# Patient Record
Sex: Male | Born: 1970 | Race: White | Hispanic: No | Marital: Single | Smoking: Never smoker
Health system: Southern US, Community
[De-identification: ages and names within clinical notes are randomized; demographics above are authoritative.]

## PROBLEM LIST (undated history)

## (undated) DIAGNOSIS — K5730 Diverticulosis of large intestine without perforation or abscess without bleeding: Secondary | ICD-10-CM

## (undated) DIAGNOSIS — F1910 Other psychoactive substance abuse, uncomplicated: Secondary | ICD-10-CM

## (undated) DIAGNOSIS — M109 Gout, unspecified: Secondary | ICD-10-CM

## (undated) DIAGNOSIS — E119 Type 2 diabetes mellitus without complications: Secondary | ICD-10-CM

## (undated) HISTORY — DX: Other psychoactive substance abuse, uncomplicated: F19.10

## (undated) HISTORY — PX: OTHER SURGICAL HISTORY: SHX169

---

## 2011-07-28 ENCOUNTER — Encounter (HOSPITAL_COMMUNITY): Payer: Self-pay | Admitting: Emergency Medicine

## 2011-07-28 ENCOUNTER — Emergency Department (HOSPITAL_COMMUNITY): Payer: Self-pay

## 2011-07-28 ENCOUNTER — Emergency Department (HOSPITAL_COMMUNITY)
Admission: EM | Admit: 2011-07-28 | Discharge: 2011-07-28 | Disposition: A | Discharge status: Home / Self Care | Payer: Self-pay | Attending: Emergency Medicine | Admitting: Emergency Medicine

## 2011-07-28 DIAGNOSIS — R209 Unspecified disturbances of skin sensation: Secondary | ICD-10-CM | POA: Insufficient documentation

## 2011-07-28 DIAGNOSIS — M79672 Pain in left foot: Secondary | ICD-10-CM

## 2011-07-28 DIAGNOSIS — M7989 Other specified soft tissue disorders: Secondary | ICD-10-CM | POA: Insufficient documentation

## 2011-07-28 DIAGNOSIS — Y990 Civilian activity done for income or pay: Secondary | ICD-10-CM | POA: Insufficient documentation

## 2011-07-28 DIAGNOSIS — S90129A Contusion of unspecified lesser toe(s) without damage to nail, initial encounter: Secondary | ICD-10-CM | POA: Insufficient documentation

## 2011-07-28 DIAGNOSIS — W208XXA Other cause of strike by thrown, projected or falling object, initial encounter: Secondary | ICD-10-CM | POA: Insufficient documentation

## 2011-07-28 DIAGNOSIS — M79609 Pain in unspecified limb: Secondary | ICD-10-CM | POA: Insufficient documentation

## 2011-07-28 MED ORDER — KETOROLAC TROMETHAMINE 10 MG PO TABS
10.0000 mg | ORAL_TABLET | Freq: Once | ORAL | Status: AC
  Administered 2011-07-28: 10 mg via ORAL
  Filled 2011-07-28: qty 1

## 2011-07-28 MED ORDER — OXYCODONE-ACETAMINOPHEN 5-325 MG PO TABS
1.0000 | ORAL_TABLET | Freq: Once | ORAL | Status: AC
  Administered 2011-07-28: 1 via ORAL
  Filled 2011-07-28: qty 1

## 2011-07-28 MED ORDER — COLCHICINE 0.6 MG PO TABS
1.2000 mg | ORAL_TABLET | Freq: Once | ORAL | Status: AC
  Administered 2011-07-28: 1.2 mg via ORAL
  Filled 2011-07-28: qty 2

## 2011-07-28 MED ORDER — OXYCODONE-ACETAMINOPHEN 5-325 MG PO TABS: 1.0000 | ORAL_TABLET | ORAL | Status: DC | PRN

## 2011-07-28 MED ORDER — COLCHICINE 0.6 MG PO TABS: 0.6000 mg | ORAL_TABLET | Freq: Every day | ORAL | Status: DC

## 2011-07-28 MED ORDER — ONDANSETRON HCL 4 MG PO TABS
4.0000 mg | ORAL_TABLET | Freq: Once | ORAL | Status: AC
  Administered 2011-07-28: 4 mg via ORAL
  Filled 2011-07-28: qty 1

## 2011-07-28 MED ORDER — KETOROLAC TROMETHAMINE 10 MG PO TABS: ORAL_TABLET | ORAL | Status: DC

## 2011-07-28 NOTE — ED Notes (Signed)
Pt DC to home with steady gait 

## 2011-07-28 NOTE — ED Notes (Signed)
Pt was at work and had a tool box fall out of van and land on foot.  Pt having pain in left big toe.

## 2011-07-28 NOTE — Discharge Instructions (Signed)
Your xray is negative for acute problem. This is probably gout arthritis. Please use medications as suggested. Percocet may cause drowsiness, use with caution.

## 2011-07-28 NOTE — ED Provider Notes (Signed)
History     CSN: [id number]  Arrival date & time 07/28/11  1229   First MD Initiated Contact with Patient 07/28/11 1338      Chief Complaint  Patient presents with  . Foot Pain    (Consider location/radiation/quality/duration/timing/severity/associated sxs/prior treatment) Patient is a 41 y.o. male presenting with foot injury. The history is provided by the patient.  Foot Injury  The incident occurred less than 1 hour ago. The incident occurred at work. The injury mechanism was a direct blow (Tool box fell out of van on left foot). The pain is present in the left foot. The quality of the pain is described as throbbing. The pain is severe. The pain has been constant since onset. Associated symptoms include inability to bear weight and tingling. He reports no foreign bodies present. The symptoms are aggravated by bearing weight. He has tried nothing for the symptoms.    History reviewed. No pertinent past medical history.  History reviewed. No pertinent past surgical history.  No family history on file.  History  Substance Use Topics  . Smoking status: Never Smoker   . Smokeless tobacco: Not on file  . Alcohol Use: Yes      Review of Systems  Constitutional: Negative for activity change.       All ROS Neg except as noted in HPI  HENT: Negative for nosebleeds and neck pain.   Eyes: Negative for photophobia and discharge.  Respiratory: Negative for cough, shortness of breath and wheezing.   Cardiovascular: Negative for chest pain and palpitations.  Gastrointestinal: Negative for abdominal pain and blood in stool.  Genitourinary: Negative for dysuria, frequency and hematuria.  Musculoskeletal: Negative for back pain and arthralgias.  Skin: Negative.   Neurological: Positive for tingling. Negative for dizziness, seizures and speech difficulty.  Psychiatric/Behavioral: Negative for hallucinations and confusion.    Allergies  Review of patient's allergies indicates no known  allergies.  Home Medications  No current outpatient prescriptions on file.  BP 143/72  Pulse 85  Temp(Src) 97.8 F (36.6 C) (Oral)  Resp 20  Ht 5\' 6"  (1.676 m)  Wt 235 lb (106.595 kg)  BMI 37.93 kg/m2  SpO2 98%  Physical Exam  Nursing note and vitals reviewed. Constitutional: He is oriented to person, place, and time. He appears well-developed and well-nourished.  Non-toxic appearance.  HENT:  Head: Normocephalic.  Right Ear: Tympanic membrane and external ear normal.  Left Ear: Tympanic membrane and external ear normal.  Eyes: EOM and lids are normal. Pupils are equal, round, and reactive to light.  Neck: Normal range of motion. Neck supple. Carotid bruit is not present.  Cardiovascular: Normal rate, regular rhythm, normal heart sounds, intact distal pulses and normal pulses.   Pulmonary/Chest: Breath sounds normal. No respiratory distress.  Abdominal: Soft. Bowel sounds are normal. There is no tenderness. There is no guarding.  Musculoskeletal: Normal range of motion.       Bruise and mils swelling of the left 1st toe and dorsum of the left foot. Pulses symetrical  Lymphadenopathy:       Head (right side): No submandibular adenopathy present.       Head (left side): No submandibular adenopathy present.    He has no cervical adenopathy.  Neurological: He is alert and oriented to person, place, and time. He has normal strength. No cranial nerve deficit or sensory deficit.  Skin: Skin is warm and dry.  Psychiatric: He has a normal mood and affect. His speech is normal.  ED Course  Procedures (including critical care time)  Labs Reviewed - No data to display Dg Foot Complete Left  07/28/2011  *RADIOLOGY REPORT*  Clinical Data: 41 year old male with fall.  Pain.  LEFT FOOT - COMPLETE 3+ VIEW  Comparison: [facility] 07/24/2011.  Findings: Bone mineralization is within normal limits.  Calcaneus intact.  Normal joint spaces.  Osseous structures of the first ray  are intact.  No acute fracture identified.  IMPRESSION: No acute fracture or dislocation identified about the left foot.  Original Report Authenticated By: Harley Hallmark, M.D.     1. Foot pain, left       MDM  I have reviewed nursing notes, vital signs, and all appropriate lab and imaging results for this patient. Left foot xray negative for fracture. Ice and elevation suggested. Rx for pain meds given. Pt to see orthopedic MD if not improving.       Kathie Dike, [place] 08/02/11 1045

## 2011-08-03 NOTE — ED Provider Notes (Signed)
Medical screening examination/treatment/procedure(s) were performed by non-physician practitioner and as supervising physician I was immediately available for consultation/collaboration.  Donnetta Hutching, MD 08/03/11 1815

## 2011-09-20 ENCOUNTER — Emergency Department (HOSPITAL_COMMUNITY): Payer: Self-pay

## 2011-09-20 ENCOUNTER — Emergency Department (HOSPITAL_COMMUNITY)
Admission: EM | Admit: 2011-09-20 | Discharge: 2011-09-20 | Disposition: A | Discharge status: Home / Self Care | Payer: Self-pay | Attending: Emergency Medicine | Admitting: Emergency Medicine

## 2011-09-20 ENCOUNTER — Encounter (HOSPITAL_COMMUNITY): Payer: Self-pay

## 2011-09-20 DIAGNOSIS — M6580 Other synovitis and tenosynovitis, unspecified site: Secondary | ICD-10-CM | POA: Insufficient documentation

## 2011-09-20 DIAGNOSIS — M778 Other enthesopathies, not elsewhere classified: Secondary | ICD-10-CM

## 2011-09-20 DIAGNOSIS — M25529 Pain in unspecified elbow: Secondary | ICD-10-CM | POA: Insufficient documentation

## 2011-09-20 HISTORY — DX: Gout, unspecified: M10.9

## 2011-09-20 MED ORDER — OXYCODONE-ACETAMINOPHEN 5-325 MG PO TABS: 1.0000 | ORAL_TABLET | ORAL | Status: AC | PRN

## 2011-09-20 MED ORDER — IBUPROFEN 600 MG PO TABS: 600.0000 mg | ORAL_TABLET | Freq: Four times a day (QID) | ORAL | Status: AC | PRN

## 2011-09-20 MED ORDER — HYDROCODONE-ACETAMINOPHEN 5-325 MG PO TABS
1.0000 | ORAL_TABLET | Freq: Once | ORAL | Status: AC
  Administered 2011-09-20: 1 via ORAL
  Filled 2011-09-20: qty 1

## 2011-09-20 MED ORDER — IBUPROFEN 600 MG PO TABS: 600.0000 mg | ORAL_TABLET | Freq: Four times a day (QID) | ORAL | Status: DC | PRN

## 2011-09-20 MED ORDER — OXYCODONE-ACETAMINOPHEN 5-325 MG PO TABS
1.0000 | ORAL_TABLET | Freq: Once | ORAL | Status: AC
  Administered 2011-09-20: 1 via ORAL
  Filled 2011-09-20: qty 1

## 2011-09-20 MED ORDER — HYDROCODONE-ACETAMINOPHEN 5-325 MG PO TABS: 1.0000 | ORAL_TABLET | ORAL | Status: DC | PRN

## 2011-09-20 NOTE — Discharge Instructions (Signed)
Tendinitis Tendinitis is swelling and inflammation of the tendons. Tendons are band-like tissues that connect muscle to bone. Tendinitis commonly occurs in the:   Shoulders (rotator cuff).   Heels (Achilles tendon).   Elbows (triceps tendon).  CAUSES Tendinitis is usually caused by overusing the tendon, muscles, and joints involved. When the tissue surrounding a tendon (synovium) becomes inflamed, it is called tenosynovitis. Tendinitis commonly develops in people whose jobs require repetitive motions. SYMPTOMS  Pain.   Tenderness.   Mild swelling.  DIAGNOSIS Tendinitis is usually diagnosed by physical exam. Your caregiver may also order X-rays or other imaging tests. TREATMENT Your caregiver may recommend certain medicines or exercises for your treatment. HOME CARE INSTRUCTIONS   Use a sling or splint for as long as directed by your caregiver until the pain decreases.   Avoid using the limb while the tendon is painful. Perform gentle range of motion exercises only as directed by your caregiver. Stop exercises if pain or discomfort increase, unless directed otherwise by your caregiver.   Only take over-the-counter or prescription medicines for pain, discomfort, or fever as directed by your caregiver.  SEEK MEDICAL CARE IF:   Your pain and swelling increase.   You develop new, unexplained symptoms, especially increased numbness in the hands.  MAKE SURE YOU:   Understand these instructions.   Will watch your condition.   Will get help right away if you are not doing well or get worse.  Document Released: 03/18/2000 Document Revised: 03/10/2011 Document Reviewed: 06/07/2010 [facility] Patient Information 2012 [place], [place].   Please obtain an elbow band at your local drugstore as discussed.  Use the medicines prescribed for inflammation and pain relief.  Do not drive within 4 hours of taking hydrocodone as this medication will make you drowsy.  Rest your elbow joint as much as  possible for the next several weeks and applying a heating pad 20 minutes 3-4 times daily may also help reduce inflammation in this tendon.  Please call Dr. Hilda Lias for further management if your symptoms persist or do not resolve with this treatment.

## 2011-09-20 NOTE — ED Notes (Signed)
Pt reports rt elbow pain for the past 6 weeks.  Pt denies any injury . Pt reports hx of gout

## 2011-09-20 NOTE — ED Provider Notes (Signed)
History     CSN: [id number]  Arrival date & time 09/20/11  1308   First MD Initiated Contact with Patient 09/20/11 1000      Chief Complaint  Patient presents with  . Elbow Pain    (Consider location/radiation/quality/duration/timing/severity/associated sxs/prior treatment) HPI Comments: Mark Acosta presents with a 6 week history of increasing right elbow pain despite injury.  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  He is right-handed.  He does have a history of gout which has never affected his elbow but has had in his toe in the past.  He denies redness or swelling at his elbow joint but has constant pain which is worse with palpation and with twisting his forearm.  He has taken ibuprofen with no relief.  He was given an oxycodone by his mother last night which did help relieve his pain is not so that he could sleep.  He has tried no other relievers of this pain other than trying icy hot which seemed to make the pain worse.  Pain did not radiate and he denies fevers, rash and swelling at the joint.  The history is provided by the patient.    Past Medical History  Diagnosis Date  . Gout     History reviewed. No pertinent past surgical history.  No family history on file.  History  Substance Use Topics  . Smoking status: Never Smoker   . Smokeless tobacco: Not on file  . Alcohol Use: Yes      Review of Systems  Musculoskeletal: Positive for arthralgias. Negative for joint swelling.  Skin: Negative for wound.  Neurological: Negative for weakness and numbness.    Allergies  Review of patient's allergies indicates no known allergies.  Home Medications   Current Outpatient Rx  Name Route Sig Dispense Refill  . HYDROCODONE-ACETAMINOPHEN 5-325 MG PO TABS Oral Take 1 tablet by mouth every 4 (four) hours as needed for pain. 20 tablet 0  . IBUPROFEN 600 MG PO TABS Oral Take 1 tablet (600 mg total) by mouth every 6 (six)  hours as needed for pain. 30 tablet 0    BP 151/76  Pulse 79  Temp 98.4 F (36.9 C) (Oral)  Resp 18  SpO2 96%  Physical Exam  Constitutional: He appears well-developed and well-nourished.  HENT:  Head: Atraumatic.  Neck: Normal range of motion.  Cardiovascular:       Pulses equal bilaterally  Musculoskeletal: He exhibits tenderness.       Right elbow: tenderness found. Lateral epicondyle tenderness noted.  Neurological: He is alert. He has normal strength. He displays normal reflexes. No sensory deficit.       Equal strength  Skin: Skin is warm and dry.  Psychiatric: He has a normal mood and affect.    ED Course  Procedures (including critical care time)  Labs Reviewed - No data to display Dg Elbow Complete Right  09/20/2011  *RADIOLOGY REPORT*  Clinical Data: 41 year old male with pain and decreased range of motion.  RIGHT ELBOW - COMPLETE 3+ VIEW  Comparison: None.  Findings: No joint effusion is evident. Bone mineralization is within normal limits.  Joint spaces and alignment within normal limits.  No fracture or dislocation.  IMPRESSION: No acute osseous abnormality identified about the right elbow.  Original Report Authenticated By: Ulla Potash III, M.D.     1. Tendinitis of right elbow       MDM  Oxycodone and ibuprofen  prescribed.  Also encouraged to pick up a tennis elbow compression band.  Heat therapy 20 minutes several times daily.  Referral to orthopedics if symptoms are not improving over the next week with this treatment.        Burgess Amor, [place] 09/20/11 1431

## 2011-09-20 NOTE — ED Provider Notes (Signed)
Medical screening examination/treatment/procedure(s) were performed by non-physician practitioner and as supervising physician I was immediately available for consultation/collaboration.  Geoffery Lyons, MD 09/20/11 1534

## 2011-09-20 NOTE — ED Notes (Signed)
Pt discharged with family.

## 2012-08-28 ENCOUNTER — Emergency Department (HOSPITAL_COMMUNITY)
Admission: EM | Admit: 2012-08-28 | Discharge: 2012-08-28 | Disposition: A | Discharge status: Home / Self Care | Payer: Self-pay | Attending: Emergency Medicine | Admitting: Emergency Medicine

## 2012-08-28 ENCOUNTER — Encounter (HOSPITAL_COMMUNITY): Payer: Self-pay

## 2012-08-28 DIAGNOSIS — M109 Gout, unspecified: Secondary | ICD-10-CM | POA: Insufficient documentation

## 2012-08-28 DIAGNOSIS — M79609 Pain in unspecified limb: Secondary | ICD-10-CM | POA: Insufficient documentation

## 2012-08-28 DIAGNOSIS — M79675 Pain in left toe(s): Secondary | ICD-10-CM

## 2012-08-28 MED ORDER — PREDNISONE 50 MG PO TABS
60.0000 mg | ORAL_TABLET | Freq: Once | ORAL | Status: AC
  Administered 2012-08-28: 60 mg via ORAL
  Filled 2012-08-28: qty 1

## 2012-08-28 MED ORDER — OXYCODONE-ACETAMINOPHEN 5-325 MG PO TABS: 1.0000 | ORAL_TABLET | ORAL | Status: DC | PRN

## 2012-08-28 MED ORDER — OXYCODONE-ACETAMINOPHEN 5-325 MG PO TABS
1.0000 | ORAL_TABLET | Freq: Once | ORAL | Status: AC
  Administered 2012-08-28: 1 via ORAL
  Filled 2012-08-28: qty 1

## 2012-08-28 MED ORDER — PREDNISONE 20 MG PO TABS: ORAL_TABLET | ORAL | Status: DC

## 2012-08-28 NOTE — ED Notes (Signed)
Pt reports history of gout.  C/O pain at base of left great toe x 3 days.  Denies injury.

## 2012-09-01 NOTE — ED Provider Notes (Signed)
History     CSN: [id number]  Arrival date & time 08/28/12  1413   First MD Initiated Contact with Patient 08/28/12 1515      Chief Complaint  Patient presents with  . Toe Pain    (Consider location/radiation/quality/duration/timing/severity/associated sxs/prior treatment) Patient is a 42 y.o. male presenting with toe pain. The history is provided by the patient.  Toe Pain This is a recurrent problem. The current episode started in the past 7 days. The problem occurs constantly. The problem has been gradually worsening. Associated symptoms include arthralgias. Pertinent negatives include no chills, fever, joint swelling, nausea, neck pain, numbness, rash, sore throat, swollen glands, urinary symptoms, visual change, vomiting or weakness. The symptoms are aggravated by bending, standing and walking. He has tried nothing for the symptoms. The treatment provided moderate relief.    Past Medical History  Diagnosis Date  . Gout     History reviewed. No pertinent past surgical history.  No family history on file.  History  Substance Use Topics  . Smoking status: Never Smoker   . Smokeless tobacco: Not on file  . Alcohol Use: Yes     Comment: beer once or twice a week      Review of Systems  Constitutional: Negative for fever and chills.  HENT: Negative for sore throat and neck pain.   Gastrointestinal: Negative for nausea and vomiting.  Genitourinary: Negative for dysuria and difficulty urinating.  Musculoskeletal: Positive for arthralgias. Negative for joint swelling.  Skin: Negative for color change, rash and wound.  Neurological: Negative for weakness and numbness.  All other systems reviewed and are negative.    Allergies  Review of patient's allergies indicates no known allergies.  Home Medications   Current Outpatient Rx  Name  Route  Sig  Dispense  Refill  . oxyCODONE-acetaminophen (PERCOCET/ROXICET) 5-325 MG per tablet   Oral   Take 1 tablet by mouth every  4 (four) hours as needed for pain.   20 tablet   0   . predniSONE (DELTASONE) 20 MG tablet      Take 3 tablets po qd x 2 days, then 2 tablets po qd x 2 days, then 1 tablet po qd x 2 days   12 tablet   0     BP 163/88  Pulse 100  Temp(Src) 97.5 F (36.4 C) (Oral)  Resp 20  Ht 5\' 6"  (1.676 m)  Wt 240 lb (108.863 kg)  BMI 38.76 kg/m2  SpO2 99%  Physical Exam  Nursing note and vitals reviewed. Constitutional: He is oriented to person, place, and time. He appears well-developed and well-nourished. No distress.  HENT:  Head: Normocephalic and atraumatic.  Cardiovascular: Normal rate, regular rhythm, normal heart sounds and intact distal pulses.   Pulmonary/Chest: Effort normal and breath sounds normal.  Musculoskeletal: He exhibits tenderness. He exhibits no edema.  ttp of the base of left great toe. ROM is preserved.  DP pulse is brisk, distal sensation intact.  No erythema, abrasion, bruising or deformity.    Neurological: He is alert and oriented to person, place, and time. He exhibits normal muscle tone. Coordination normal.  Skin: Skin is warm and dry.    ED Course  Procedures (including critical care time)  Labs Reviewed - No data to display No results found.   1. Toe pain, left   2. Gout of big toe       MDM   Pt with hx of recurring toe pain,  No erythema or sts of  the joint.  Hx of gout per patient.  Denies injury.  Will treat with steroids and percocet.  Pt agrees to f/u with pmd if needed.         Islam Villescas L. Trisha Mangle, PA-C 09/01/12 2247

## 2012-09-02 ENCOUNTER — Encounter (HOSPITAL_COMMUNITY): Payer: Self-pay | Admitting: Emergency Medicine

## 2012-09-02 ENCOUNTER — Emergency Department (HOSPITAL_COMMUNITY)
Admission: EM | Admit: 2012-09-02 | Discharge: 2012-09-02 | Disposition: A | Discharge status: Home / Self Care | Payer: Self-pay | Attending: Emergency Medicine | Admitting: Emergency Medicine

## 2012-09-02 DIAGNOSIS — M109 Gout, unspecified: Secondary | ICD-10-CM | POA: Insufficient documentation

## 2012-09-02 DIAGNOSIS — M2550 Pain in unspecified joint: Secondary | ICD-10-CM | POA: Insufficient documentation

## 2012-09-02 DIAGNOSIS — R109 Unspecified abdominal pain: Secondary | ICD-10-CM | POA: Insufficient documentation

## 2012-09-02 DIAGNOSIS — Z8719 Personal history of other diseases of the digestive system: Secondary | ICD-10-CM | POA: Insufficient documentation

## 2012-09-02 HISTORY — DX: Diverticulosis of large intestine without perforation or abscess without bleeding: K57.30

## 2012-09-02 MED ORDER — MORPHINE SULFATE 4 MG/ML IJ SOLN
10.0000 mg | Freq: Once | INTRAMUSCULAR | Status: AC
  Administered 2012-09-02: 10 mg via INTRAMUSCULAR
  Filled 2012-09-02: qty 3

## 2012-09-02 MED ORDER — COLCHICINE 0.6 MG PO TABS: 0.6000 mg | ORAL_TABLET | Freq: Every day | ORAL | Status: DC

## 2012-09-02 MED ORDER — OXYCODONE-ACETAMINOPHEN 5-325 MG PO TABS: 1.0000 | ORAL_TABLET | Freq: Four times a day (QID) | ORAL | Status: DC | PRN

## 2012-09-02 MED ORDER — ONDANSETRON HCL 4 MG PO TABS
4.0000 mg | ORAL_TABLET | Freq: Once | ORAL | Status: AC
  Administered 2012-09-02: 4 mg via ORAL
  Filled 2012-09-02: qty 1

## 2012-09-02 MED ORDER — COLCHICINE 0.6 MG PO TABS
0.6000 mg | ORAL_TABLET | Freq: Once | ORAL | Status: AC
  Administered 2012-09-02: 0.6 mg via ORAL
  Filled 2012-09-02: qty 1

## 2012-09-02 MED ORDER — OXYCODONE-ACETAMINOPHEN 5-325 MG PO TABS
1.0000 | ORAL_TABLET | Freq: Once | ORAL | Status: AC
  Administered 2012-09-02: 1 via ORAL
  Filled 2012-09-02: qty 1

## 2012-09-02 NOTE — ED Provider Notes (Signed)
Medical screening examination/treatment/procedure(s) were performed by non-physician practitioner and as supervising physician I was immediately available for consultation/collaboration.   Glynn Octave, MD 09/02/12 [phone]

## 2012-09-02 NOTE — ED Provider Notes (Signed)
History     CSN: [id number]  Arrival date & time 09/02/12  7341   First MD Initiated Contact with Patient 09/02/12 1006      Chief Complaint  Patient presents with  . Gout    (Consider location/radiation/quality/duration/timing/severity/associated sxs/prior treatment) HPI Comments: Patient is a 42 year old male who has a history of gout. The patient states that he's been having gout for the last 10-14 days. He was seen in the emergency department recently for a gout problem. He was treated with Percocet and prednisone. The patient states the prednisone does not seem to be helping at all. Patient states he has had colchicine in the past which worked well and he desires to have something for pain and culture seen once again. This problem involves mostly the left foot. Patient's current medications are not helping.  The history is provided by the patient.    Past Medical History  Diagnosis Date  . Gout   . Diverticula of colon     No past surgical history on file.  No family history on file.  History  Substance Use Topics  . Smoking status: Never Smoker   . Smokeless tobacco: Not on file  . Alcohol Use: Yes     Comment: beer once or twice a week      Review of Systems  Constitutional: Negative for activity change.       All ROS Neg except as noted in HPI  HENT: Negative for nosebleeds and neck pain.   Eyes: Negative for photophobia and discharge.  Respiratory: Negative for cough, shortness of breath and wheezing.   Cardiovascular: Negative for chest pain and palpitations.  Gastrointestinal: Positive for abdominal pain. Negative for blood in stool.  Genitourinary: Negative for dysuria, frequency and hematuria.  Musculoskeletal: Positive for arthralgias. Negative for back pain.  Skin: Negative.   Neurological: Negative for dizziness, seizures and speech difficulty.  Psychiatric/Behavioral: Negative for hallucinations and confusion.    Allergies  Review of patient's  allergies indicates no known allergies.  Home Medications   Current Outpatient Rx  Name  Route  Sig  Dispense  Refill  . ibuprofen (ADVIL,MOTRIN) 200 MG tablet   Oral   Take 800 mg by mouth every 6 (six) hours as needed for pain.           BP 141/91  Pulse 88  Temp(Src) 97.7 F (36.5 C) (Oral)  Resp 20  Ht 5\' 6"  (1.676 m)  Wt 240 lb (108.863 kg)  BMI 38.76 kg/m2  SpO2 100%  Physical Exam  Nursing note and vitals reviewed. Constitutional: He is oriented to person, place, and time. He appears well-developed and well-nourished.  Non-toxic appearance.  HENT:  Head: Normocephalic.  Right Ear: Tympanic membrane and external ear normal.  Left Ear: Tympanic membrane and external ear normal.  Eyes: EOM and lids are normal. Pupils are equal, round, and reactive to light.  Neck: Normal range of motion. Neck supple. Carotid bruit is not present.  Cardiovascular: Normal rate, regular rhythm, normal heart sounds, intact distal pulses and normal pulses.   Pulmonary/Chest: Breath sounds normal. No respiratory distress.  Abdominal: Soft. Bowel sounds are normal. There is no tenderness. There is no guarding.  Musculoskeletal: Normal range of motion.  There is increased redness and some swelling of the left first toe, in particular the MP joint area. There is some soreness of the left ankle with attempted range of motion. The Achilles tendon is intact. There is good range of motion of the left  knee and hip.  Lymphadenopathy:       Head (right side): No submandibular adenopathy present.       Head (left side): No submandibular adenopathy present.    He has no cervical adenopathy.  Neurological: He is alert and oriented to person, place, and time. He has normal strength. No cranial nerve deficit or sensory deficit.  Skin: Skin is warm and dry.  Psychiatric: He has a normal mood and affect. His speech is normal.    ED Course  Procedures (including critical care time)  Labs Reviewed - No  data to display No results found.   No diagnosis found.    MDM  I have reviewed nursing notes, vital signs, and all appropriate lab and imaging results for this patient. Patient has a history of gout, and is having an acute flare that is not being resolved with prednisone and Percocet. We'll change the patient to colchicine. Patient is to continue his Percocet. Patient encouraged to see his primary care physician on Thursday as scheduled.       Kathie Dike, PA-C 09/02/12 1135

## 2012-09-02 NOTE — ED Notes (Signed)
States he started having pain in left foot about 10 days ago.  States that he has a history of gout and this pain feels similar.  States that he was seen here about 6 days ago and was prescribed prednisone without any improvement.  States that his PCP is unable to see him until Thursday this week.

## 2012-09-02 NOTE — ED Provider Notes (Signed)
Medical screening examination/treatment/procedure(s) were performed by non-physician practitioner and as supervising physician I was immediately available for consultation/collaboration.  Karrah Mangini R. Indie Nickerson, MD 09/02/12 0803 

## 2012-09-05 ENCOUNTER — Telehealth: Payer: Self-pay | Admitting: Orthopedic Surgery

## 2012-09-05 NOTE — Telephone Encounter (Signed)
Patient called - inquiring about appointment following 3 visits to Emergency Room for problem left foot pain, swelling, which he states had been told is "gout";  Xrays were done at initial Emergency Department visit, 07/27/12; not done for 08/28/12 or 09/03/12 visits.  In addition, he had Xrays approximately 1 year ago on same foot at [facility].  Patient states also that he will fill the prescription on Friday, 09/07/12, for the prescribed medication, Colchicine.  Question #1:  Due to patient relaying that he does not have a primary care physician, and is self-pay, is this problem one that we treat? (We reviewed self-pay minimum payments with patient.)  Question #2:  If approved to be scheduled here, are the [facility] films and records from 1 year ago needed for the appointment? (Patient aware he would need to contact [facility] to request)  Please advise.  Patient's ph# is [phone].

## 2012-09-19 ENCOUNTER — Emergency Department (HOSPITAL_COMMUNITY)
Admission: EM | Admit: 2012-09-19 | Discharge: 2012-09-19 | Disposition: A | Discharge status: Home / Self Care | Payer: Self-pay | Attending: Emergency Medicine | Admitting: Emergency Medicine

## 2012-09-19 ENCOUNTER — Encounter (HOSPITAL_COMMUNITY): Payer: Self-pay | Admitting: *Deleted

## 2012-09-19 DIAGNOSIS — Z79899 Other long term (current) drug therapy: Secondary | ICD-10-CM | POA: Insufficient documentation

## 2012-09-19 DIAGNOSIS — Z8719 Personal history of other diseases of the digestive system: Secondary | ICD-10-CM | POA: Insufficient documentation

## 2012-09-19 DIAGNOSIS — M10071 Idiopathic gout, right ankle and foot: Secondary | ICD-10-CM

## 2012-09-19 DIAGNOSIS — M109 Gout, unspecified: Secondary | ICD-10-CM | POA: Insufficient documentation

## 2012-09-19 MED ORDER — OXYCODONE-ACETAMINOPHEN 5-325 MG PO TABS: 2.0000 | ORAL_TABLET | ORAL | Status: DC | PRN

## 2012-09-19 MED ORDER — PREDNISONE 10 MG PO TABS: 50.0000 mg | ORAL_TABLET | Freq: Every day | ORAL | Status: DC

## 2012-09-19 MED ORDER — OXYCODONE-ACETAMINOPHEN 5-325 MG PO TABS
2.0000 | ORAL_TABLET | Freq: Once | ORAL | Status: AC
  Administered 2012-09-19: 2 via ORAL
  Filled 2012-09-19: qty 2

## 2012-09-19 MED ORDER — COLCHICINE 0.6 MG PO TABS: ORAL_TABLET | ORAL | Status: DC

## 2012-09-19 MED ORDER — INDOMETHACIN 25 MG PO CAPS: 25.0000 mg | ORAL_CAPSULE | Freq: Three times a day (TID) | ORAL | Status: DC | PRN

## 2012-09-19 NOTE — ED Provider Notes (Signed)
History     CSN: [id number]  Arrival date & time 09/19/12  1607   First MD Initiated Contact with Patient 09/19/12 1638      Chief Complaint  Patient presents with  . Gout    (Consider location/radiation/quality/duration/timing/severity/associated sxs/prior treatment) Patient is a 42 y.o. male presenting with toe pain. The history is provided by the patient. No language interpreter was used.  Toe Pain This is a new problem. The current episode started today. The problem occurs constantly. The problem has been gradually worsening. Associated symptoms include joint swelling and myalgias. Nothing aggravates the symptoms. He has tried nothing for the symptoms. The treatment provided moderate relief.  Pt complains of pain from gout.  No relief with colchicine  Past Medical History  Diagnosis Date  . Gout   . Diverticula of colon     History reviewed. No pertinent past surgical history.  History reviewed. No pertinent family history.  History  Substance Use Topics  . Smoking status: Never Smoker   . Smokeless tobacco: Not on file  . Alcohol Use: Yes     Comment: beer once or twice a week      Review of Systems  Musculoskeletal: Positive for myalgias and joint swelling.  All other systems reviewed and are negative.    Allergies  Review of patient's allergies indicates no known allergies.  Home Medications   Current Outpatient Rx  Name  Route  Sig  Dispense  Refill  . colchicine 0.6 MG tablet   Oral   Take 1 tablet (0.6 mg total) by mouth daily.   15 tablet   0   . ibuprofen (ADVIL,MOTRIN) 200 MG tablet   Oral   Take 800 mg by mouth every 6 (six) hours as needed for pain.           BP 163/90  Pulse 101  Temp(Src) 98 F (36.7 C) (Oral)  Resp 17  Ht 5\' 6"  (1.676 m)  Wt 240 lb (108.863 kg)  BMI 38.76 kg/m2  SpO2 96%  Physical Exam  Nursing note and vitals reviewed. Constitutional: He is oriented to person, place, and time. He appears well-developed  and well-nourished.  Musculoskeletal: He exhibits tenderness.  Swollen left 5th toe  Neurological: He is alert and oriented to person, place, and time. He has normal reflexes.  Skin: There is erythema.  Psychiatric: He has a normal mood and affect.    ED Course  Procedures (including critical care time)  Labs Reviewed - No data to display No results found.   1. Gouty arthritis of toe, right       MDM  Prednisone,  Indocin, colchicine and Percocet        Elson Areas, PA-C 09/19/12 1709  Elson Areas, PA-C 09/19/12 1718

## 2012-09-19 NOTE — ED Provider Notes (Signed)
Medical screening examination/treatment/procedure(s) were performed by non-physician practitioner and as supervising physician I was immediately available for consultation/collaboration.   Glynn Octave, MD 09/19/12 [phone]

## 2012-09-19 NOTE — ED Notes (Signed)
Pain  Lt great toe due to gout.

## 2012-09-19 NOTE — ED Notes (Signed)
Patient has taken relatives colchicine which has barely relieved pain in great toe.  States in past colchicine worked w/in 5 days.  Has recently been on steroids for gout which has not helped much.

## 2012-09-19 NOTE — ED Notes (Signed)
Patient with no complaints at this time. Respirations even and unlabored. Skin warm/dry. Discharge instructions reviewed with patient at this time. Patient given opportunity to voice concerns/ask questions. Patient discharged at this time and left Emergency Department with steady gait.   

## 2012-10-24 ENCOUNTER — Emergency Department (HOSPITAL_COMMUNITY)
Admission: EM | Admit: 2012-10-24 | Discharge: 2012-10-24 | Disposition: A | Discharge status: Home / Self Care | Payer: Self-pay | Attending: Emergency Medicine | Admitting: Emergency Medicine

## 2012-10-24 ENCOUNTER — Encounter (HOSPITAL_COMMUNITY): Payer: Self-pay | Admitting: Emergency Medicine

## 2012-10-24 DIAGNOSIS — IMO0002 Reserved for concepts with insufficient information to code with codable children: Secondary | ICD-10-CM | POA: Insufficient documentation

## 2012-10-24 DIAGNOSIS — M25473 Effusion, unspecified ankle: Secondary | ICD-10-CM | POA: Insufficient documentation

## 2012-10-24 DIAGNOSIS — R21 Rash and other nonspecific skin eruption: Secondary | ICD-10-CM | POA: Insufficient documentation

## 2012-10-24 DIAGNOSIS — M79609 Pain in unspecified limb: Secondary | ICD-10-CM | POA: Insufficient documentation

## 2012-10-24 DIAGNOSIS — M25579 Pain in unspecified ankle and joints of unspecified foot: Secondary | ICD-10-CM | POA: Insufficient documentation

## 2012-10-24 DIAGNOSIS — M25476 Effusion, unspecified foot: Secondary | ICD-10-CM | POA: Insufficient documentation

## 2012-10-24 DIAGNOSIS — Z8719 Personal history of other diseases of the digestive system: Secondary | ICD-10-CM | POA: Insufficient documentation

## 2012-10-24 DIAGNOSIS — M109 Gout, unspecified: Secondary | ICD-10-CM | POA: Insufficient documentation

## 2012-10-24 DIAGNOSIS — B353 Tinea pedis: Secondary | ICD-10-CM | POA: Insufficient documentation

## 2012-10-24 MED ORDER — CLOTRIMAZOLE 1 % EX CREA: TOPICAL_CREAM | CUTANEOUS | Status: DC

## 2012-10-24 MED ORDER — CEPHALEXIN 500 MG PO CAPS: 500.0000 mg | ORAL_CAPSULE | Freq: Four times a day (QID) | ORAL | Status: DC

## 2012-10-24 MED ORDER — COLCHICINE 0.6 MG PO TABS
0.6000 mg | ORAL_TABLET | Freq: Once | ORAL | Status: DC
  Filled 2012-10-24: qty 1

## 2012-10-24 MED ORDER — INDOMETHACIN 50 MG PO CAPS: 50.0000 mg | ORAL_CAPSULE | Freq: Three times a day (TID) | ORAL | Status: DC | PRN

## 2012-10-24 MED ORDER — COLCHICINE 0.6 MG PO TABS
1.2000 mg | ORAL_TABLET | Freq: Once | ORAL | Status: AC
  Administered 2012-10-24: 1.2 mg via ORAL
  Filled 2012-10-24: qty 2

## 2012-10-24 MED ORDER — OXYCODONE-ACETAMINOPHEN 5-325 MG PO TABS
1.0000 | ORAL_TABLET | Freq: Once | ORAL | Status: AC
  Administered 2012-10-24: 1 via ORAL
  Filled 2012-10-24: qty 1

## 2012-10-24 MED ORDER — OXYCODONE-ACETAMINOPHEN 5-325 MG PO TABS: 1.0000 | ORAL_TABLET | ORAL | Status: DC | PRN

## 2012-10-24 NOTE — ED Notes (Signed)
Pt c/o pain and swelling to left big toe x2-3 days. Pt states symptoms have progressively worsened. Pt has of hx of gout.

## 2012-10-24 NOTE — ED Notes (Signed)
States that he has a gout flare up in his left big toe and has a skin issue on the bottom on his right foot.

## 2012-10-27 NOTE — ED Provider Notes (Signed)
CSN: [id number]     Arrival date & time 10/24/12  1438 History     First MD Initiated Contact with Patient 10/24/12 1511     Chief Complaint  Patient presents with  . Gout  . Foot Pain   (Consider location/radiation/quality/duration/timing/severity/associated sxs/prior Treatment) HPI Comments: Mark Acosta is a 42 y.o. Male with a history of gout, which flares in his left great toe, almost exclusively.  He reports pain and swelling which started flaring again over the past day.  He states he used to get flares every year or two, but has been experiencing for frequent episodes,  Most recently just 2 months ago. He expresses frustration as he has been changing his diet to avoid provoking foods with worsening symptoms. He denies injury.  He also has complaint of right plantar foot rash which is itchy but also now painful.  There is no drainage from the rash.  He has used otc athletes foot treatment without improvement.     The history is provided by the patient.    Past Medical History  Diagnosis Date  . Gout   . Diverticula of colon    History reviewed. No pertinent past surgical history. History reviewed. No pertinent family history. History  Substance Use Topics  . Smoking status: Never Smoker   . Smokeless tobacco: Not on file  . Alcohol Use: Yes     Comment: beer once or twice a week    Review of Systems  Constitutional: Negative for fever and chills.  HENT: Negative for facial swelling.   Respiratory: Negative for shortness of breath and wheezing.   Musculoskeletal: Positive for joint swelling and arthralgias. Negative for myalgias.  Skin: Positive for rash. Negative for color change.  Neurological: Negative for weakness and numbness.    Allergies  Review of patient's allergies indicates no known allergies.  Home Medications   Current Outpatient Rx  Name  Route  Sig  Dispense  Refill  . cephALEXin (KEFLEX) 500 MG capsule   Oral   Take 1 capsule (500 mg total)  by mouth 4 (four) times daily.   40 capsule   0   . clotrimazole (LOTRIMIN) 1 % cream      Apply to affected area 2 times daily   30 g   0   . colchicine 0.6 MG tablet   Oral   Take 1 tablet (0.6 mg total) by mouth daily.   15 tablet   0   . colchicine 0.6 MG tablet      Take one tablet 4 times a day   20 tablet   0   . ibuprofen (ADVIL,MOTRIN) 200 MG tablet   Oral   Take 800 mg by mouth every 6 (six) hours as needed for pain.         . indomethacin (INDOCIN) 25 MG capsule   Oral   Take 1 capsule (25 mg total) by mouth 3 (three) times daily as needed.   30 capsule   0   . indomethacin (INDOCIN) 50 MG capsule   Oral   Take 1 capsule (50 mg total) by mouth 3 (three) times daily with meals as needed.   30 capsule   0   . oxyCODONE-acetaminophen (PERCOCET/ROXICET) 5-325 MG per tablet   Oral   Take 2 tablets by mouth every 4 (four) hours as needed for pain.   20 tablet   0   . oxyCODONE-acetaminophen (PERCOCET/ROXICET) 5-325 MG per tablet   Oral   Take 1  tablet by mouth every 4 (four) hours as needed for pain.   20 tablet   0   . predniSONE (DELTASONE) 10 MG tablet   Oral   Take 5 tablets (50 mg total) by mouth daily.   5 tablet   0    BP 149/99  Pulse 102  Temp(Src) 97.9 F (36.6 C) (Oral)  Resp 20  Ht 5\' 6"  (1.676 m)  Wt 240 lb (108.863 kg)  BMI 38.76 kg/m2  SpO2 98% Physical Exam  Constitutional: He appears well-developed and well-nourished. No distress.  HENT:  Head: Normocephalic and atraumatic.  Neck: Normal range of motion. Neck supple.  Cardiovascular: Normal rate.   Pulses equal bilaterally  Pulmonary/Chest: Effort normal. He has no wheezes.  Musculoskeletal: Normal range of motion. He exhibits tenderness. He exhibits no edema.       Left foot: He exhibits tenderness and swelling. He exhibits normal capillary refill, no crepitus and no deformity.       Feet:  Tender to even light palpation.  Mild erythema,  Increased warmth. No  visible signs of trauma.  Pedal pulses intact.  Less than 3 sec cap refill in toes.  Neurological: He is alert. He has normal strength. He displays normal reflexes. No sensory deficit.  Equal strength  Skin: Skin is warm and dry. Rash noted.  Scaling,  Peeling skin of right plantar foot, lateral distal with wet maceration between 3rd, 4th and 5th toes. Mild erythema over lateral plantar mtp's.  No red streaking,  No purulent drainage.  Psychiatric: He has a normal mood and affect.    ED Course   Procedures (including critical care time)  Labs Reviewed - No data to display No results found. 1. Gout attack   2. Tinea pedis, right     MDM  Pt has responded positively to colchicine in the past, but unable to afford. He was given 1 1.2 mg dose here,  Gave 0.6 mg tab to take in 1 hour.  Additionally,  He was prescribed oxycodone for pain, indocin,  Encouraged warm compresses,  Elevation of the left foot until gout flare improved.  There is no exam findings suggesting trauma or infection in this left great toe joint.  He was prescribed lotrimin 1% cream for the right skin rash,  This is tinea,  Concerning for possible skin infection given erythema and increased pain.  Prescribed keflex,  Encouraged warm water soaks,  Dry completely.  Recheck here for any worsened sx.  Referrals given for establishing pcp.  Burgess Amor, PA-C 10/27/12 1147

## 2012-11-06 NOTE — ED Provider Notes (Signed)
Medical screening examination/treatment/procedure(s) were performed by non-physician practitioner and as supervising physician I was immediately available for consultation/collaboration. Robley Matassa, MD, FACEP   Yusuf Yu L Amerah Puleo, MD 11/06/12 1303 

## 2013-11-22 ENCOUNTER — Emergency Department (HOSPITAL_COMMUNITY)
Admission: EM | Admit: 2013-11-22 | Discharge: 2013-11-22 | Disposition: A | Discharge status: Home / Self Care | Payer: Self-pay | Attending: Emergency Medicine | Admitting: Emergency Medicine

## 2013-11-22 ENCOUNTER — Encounter (HOSPITAL_COMMUNITY): Payer: Self-pay | Admitting: Emergency Medicine

## 2013-11-22 DIAGNOSIS — Z8719 Personal history of other diseases of the digestive system: Secondary | ICD-10-CM | POA: Insufficient documentation

## 2013-11-22 DIAGNOSIS — Z8639 Personal history of other endocrine, nutritional and metabolic disease: Secondary | ICD-10-CM | POA: Insufficient documentation

## 2013-11-22 DIAGNOSIS — M25562 Pain in left knee: Secondary | ICD-10-CM

## 2013-11-22 DIAGNOSIS — Z862 Personal history of diseases of the blood and blood-forming organs and certain disorders involving the immune mechanism: Secondary | ICD-10-CM | POA: Insufficient documentation

## 2013-11-22 DIAGNOSIS — M25569 Pain in unspecified knee: Secondary | ICD-10-CM | POA: Insufficient documentation

## 2013-11-22 MED ORDER — OXYCODONE-ACETAMINOPHEN 5-325 MG PO TABS
2.0000 | ORAL_TABLET | Freq: Once | ORAL | Status: AC
  Administered 2013-11-22: 2 via ORAL

## 2013-11-22 MED ORDER — ONDANSETRON 8 MG PO TBDP
8.0000 mg | ORAL_TABLET | Freq: Once | ORAL | Status: AC
  Administered 2013-11-22: 8 mg via ORAL

## 2013-11-22 MED ORDER — ONDANSETRON 8 MG PO TBDP
ORAL_TABLET | ORAL | Status: AC
  Filled 2013-11-22: qty 1

## 2013-11-22 MED ORDER — ALLOPURINOL 100 MG PO TABS: 150.0000 mg | ORAL_TABLET | Freq: Every day | ORAL | Status: DC

## 2013-11-22 MED ORDER — OXYCODONE-ACETAMINOPHEN 5-325 MG PO TABS: 1.0000 | ORAL_TABLET | Freq: Four times a day (QID) | ORAL | Status: DC | PRN

## 2013-11-22 MED ORDER — COLCHICINE 0.6 MG PO TABS
1.2000 mg | ORAL_TABLET | Freq: Once | ORAL | Status: AC
  Administered 2013-11-22: 1.2 mg via ORAL

## 2013-11-22 MED ORDER — COLCHICINE 0.6 MG PO TABS: ORAL_TABLET | ORAL | Status: DC

## 2013-11-22 MED ORDER — COLCHICINE 0.6 MG PO TABS
ORAL_TABLET | ORAL | Status: DC
Start: 2013-11-22 — End: 2013-11-22
  Filled 2013-11-22: qty 2

## 2013-11-22 MED ORDER — OXYCODONE-ACETAMINOPHEN 5-325 MG PO TABS
ORAL_TABLET | ORAL | Status: DC
Start: 2013-11-22 — End: 2013-11-22
  Filled 2013-11-22: qty 2

## 2013-11-22 NOTE — ED Provider Notes (Signed)
CSN: [id number]     Arrival date & time 11/22/13  1131 History   First MD Initiated Contact with Patient 11/22/13 1201     Chief Complaint  Patient presents with  . Knee Pain     (Consider location/radiation/quality/duration/timing/severity/associated sxs/prior Treatment) HPI Comments: Patient is a 43 year old male with history of gout who presents to the emergency department for evaluation of sudden onset left knee pain. He states that the pain began last night before he went to bed. He states his knee, but woke up at 2 AM. He was unable to stand due to the pain. He describes the pain as "potato being stuffed and bursting out". This feels like his prior gout. He generally gets gout in his great toe, but has had gout in his knee once before. He has had a good response to colchicine in the past. No fevers, chills, nausea, vomiting, shortness of breath, chest pain. He otherwise feels well other than the knee pain.  Patient is a 43 y.o. male presenting with knee pain. The history is provided by the patient. No language interpreter was used.  Knee Pain Associated symptoms: no fever     Past Medical History  Diagnosis Date  . Gout   . Diverticula of colon    History reviewed. No pertinent past surgical history. No family history on file. History  Substance Use Topics  . Smoking status: Never Smoker   . Smokeless tobacco: Not on file  . Alcohol Use: Yes     Comment: beer once or twice a week    Review of Systems  Constitutional: Negative for fever and chills.  Respiratory: Negative for shortness of breath.   Cardiovascular: Negative for chest pain.  Gastrointestinal: Negative for nausea and vomiting.  Musculoskeletal: Positive for arthralgias, joint swelling and myalgias.  All other systems reviewed and are negative.     Allergies  Review of patient's allergies indicates no known allergies.  Home Medications   Prior to Admission medications   Not on File   BP 142/95   Pulse 101  Temp(Src) 98 F (36.7 C) (Oral)  Resp 17  Ht 5\' 6"  (1.676 m)  Wt 245 lb (111.131 kg)  BMI 39.56 kg/m2  SpO2 98% Physical Exam  Nursing note and vitals reviewed. Constitutional: He is oriented to person, place, and time. He appears well-developed and well-nourished. No distress.  HENT:  Head: Normocephalic and atraumatic.  Right Ear: External ear normal.  Left Ear: External ear normal.  Nose: Nose normal.  Eyes: Conjunctivae are normal.  Neck: Normal range of motion. No tracheal deviation present.  Cardiovascular: Normal rate, regular rhythm, normal heart sounds, intact distal pulses and normal pulses.   Pulses:      Posterior tibial pulses are 2+ on the right side, and 2+ on the left side.  No calf tenderness  Pulmonary/Chest: Effort normal and breath sounds normal. No stridor.  Abdominal: Soft. He exhibits no distension. There is no tenderness.  Musculoskeletal: Normal range of motion.  Exquisitely tender to palpation over left anterior knee. Mild amount of swelling. No erythema, bruising, or warmth.  Neurological: He is alert and oriented to person, place, and time.  Skin: Skin is warm and dry. He is not diaphoretic.  Psychiatric: He has a normal mood and affect. His behavior is normal.    ED Course  Procedures (including critical care time) Labs Review Labs Reviewed - No data to display  Imaging Review No results found.   EKG Interpretation None  MDM   Final diagnoses:  Knee pain, left    Patient presents emergency department for evaluation of left knee pain. This feels similar to prior episodes of gout. On exam it does not appear infectious. Exquisitely tender to palpation. Physical exam is consistent with gout. Neurovascularly intact, compartment soft. Patient has had good results with colchicine in the past. Will give Percocet and colchicine here and discharged with prescriptions. Discussed reasons to return to emergency Department immediately.  Vital signs stable for discharge. Patient / Family / Caregiver informed of clinical course, understand medical decision-making process, and agree with plan.     Mora BellmanHannah S Cipriano Millikan, PA-C 11/22/13 1533

## 2013-11-22 NOTE — ED Notes (Signed)
Left knee pain starting last night.

## 2013-11-22 NOTE — ED Notes (Signed)
Patient w/hx of gout has pain in L knee that began last night before bed.  He iced it at that time, but upon rising at 0200, was unable to stand d/t pain.  He has had gout in L great toe and knee before which responded to colchicine and allopurinol.  Denies any recent injury or increase in activity or standing.

## 2013-11-22 NOTE — ED Notes (Signed)
Patient with no complaints at this time. Respirations even and unlabored. Skin warm/dry. Discharge instructions reviewed with patient at this time. Patient given opportunity to voice concerns/ask questions. Patient discharged at this time and left Emergency Department with steady gait.   

## 2013-11-25 NOTE — ED Provider Notes (Signed)
Medical screening examination/treatment/procedure(s) were performed by non-physician practitioner and as supervising physician I was immediately available for consultation/collaboration.   EKG Interpretation None        Alegandro Macnaughton L Trevion Hoben, MD 11/25/13 1703 

## 2013-11-27 ENCOUNTER — Emergency Department (HOSPITAL_COMMUNITY)
Admission: EM | Admit: 2013-11-27 | Discharge: 2013-11-27 | Disposition: A | Discharge status: Home / Self Care | Payer: Self-pay | Attending: Emergency Medicine | Admitting: Emergency Medicine

## 2013-11-27 ENCOUNTER — Encounter (HOSPITAL_COMMUNITY): Payer: Self-pay | Admitting: Emergency Medicine

## 2013-11-27 DIAGNOSIS — Z791 Long term (current) use of non-steroidal anti-inflammatories (NSAID): Secondary | ICD-10-CM | POA: Insufficient documentation

## 2013-11-27 DIAGNOSIS — Z79899 Other long term (current) drug therapy: Secondary | ICD-10-CM | POA: Insufficient documentation

## 2013-11-27 DIAGNOSIS — M109 Gout, unspecified: Secondary | ICD-10-CM | POA: Insufficient documentation

## 2013-11-27 DIAGNOSIS — IMO0002 Reserved for concepts with insufficient information to code with codable children: Secondary | ICD-10-CM | POA: Insufficient documentation

## 2013-11-27 DIAGNOSIS — Z8719 Personal history of other diseases of the digestive system: Secondary | ICD-10-CM | POA: Insufficient documentation

## 2013-11-27 MED ORDER — OXYCODONE-ACETAMINOPHEN 5-325 MG PO TABS
1.0000 | ORAL_TABLET | Freq: Once | ORAL | Status: AC
  Administered 2013-11-27: 1 via ORAL
  Filled 2013-11-27: qty 1

## 2013-11-27 MED ORDER — PREDNISONE 10 MG PO TABS: ORAL_TABLET | ORAL | Status: DC

## 2013-11-27 MED ORDER — PREDNISONE 50 MG PO TABS
60.0000 mg | ORAL_TABLET | Freq: Once | ORAL | Status: AC
  Administered 2013-11-27: 60 mg via ORAL
  Filled 2013-11-27 (×2): qty 1

## 2013-11-27 MED ORDER — OXYCODONE-ACETAMINOPHEN 5-325 MG PO TABS: 1.0000 | ORAL_TABLET | ORAL | Status: DC | PRN

## 2013-11-27 NOTE — Discharge Instructions (Signed)
Gout Gout is an inflammatory arthritis caused by a buildup of uric acid crystals in the joints. Uric acid is a chemical that is normally present in the blood. When the level of uric acid in the blood is too high it can form crystals that deposit in your joints and tissues. This causes joint redness, soreness, and swelling (inflammation). Repeat attacks are common. Over time, uric acid crystals can form into masses (tophi) near a joint, destroying bone and causing disfigurement. Gout is treatable and often preventable. CAUSES  The disease begins with elevated levels of uric acid in the blood. Uric acid is produced by your body when it breaks down a naturally found substance called purines. Certain foods you eat, such as meats and fish, contain high amounts of purines. Causes of an elevated uric acid level include:  Being passed down from parent to child (heredity).  Diseases that cause increased uric acid production (such as obesity, psoriasis, and certain cancers).  Excessive alcohol use.  Diet, especially diets rich in meat and seafood.  Medicines, including certain cancer-fighting medicines (chemotherapy), water pills (diuretics), and aspirin.  Chronic kidney disease. The kidneys are no longer able to remove uric acid well.  Problems with metabolism. Conditions strongly associated with gout include:  Obesity.  High blood pressure.  High cholesterol.  Diabetes. Not everyone with elevated uric acid levels gets gout. It is not understood why some people get gout and others do not. Surgery, joint injury, and eating too much of certain foods are some of the factors that can lead to gout attacks. SYMPTOMS   An attack of gout comes on quickly. It causes intense pain with redness, swelling, and warmth in a joint.  Fever can occur.  Often, only one joint is involved. Certain joints are more commonly involved:  Base of the big toe.  Knee.  Ankle.  Wrist.  Finger. Without  treatment, an attack usually goes away in a few days to weeks. Between attacks, you usually will not have symptoms, which is different from many other forms of arthritis. DIAGNOSIS  Your caregiver will suspect gout based on your symptoms and exam. In some cases, tests may be recommended. The tests may include:  Blood tests.  Urine tests.  X-rays.  Joint fluid exam. This exam requires a needle to remove fluid from the joint (arthrocentesis). Using a microscope, gout is confirmed when uric acid crystals are seen in the joint fluid. TREATMENT  There are two phases to gout treatment: treating the sudden onset (acute) attack and preventing attacks (prophylaxis).  Treatment of an Acute Attack.  Medicines are used. These include anti-inflammatory medicines or steroid medicines.  An injection of steroid medicine into the affected joint is sometimes necessary.  The painful joint is rested. Movement can worsen the arthritis.  You may use warm or cold treatments on painful joints, depending which works best for you.  Treatment to Prevent Attacks.  If you suffer from frequent gout attacks, your caregiver may advise preventive medicine. These medicines are started after the acute attack subsides. These medicines either help your kidneys eliminate uric acid from your body or decrease your uric acid production. You may need to stay on these medicines for a very long time.  The early phase of treatment with preventive medicine can be associated with an increase in acute gout attacks. For this reason, during the first few months of treatment, your caregiver may also advise you to take medicines usually used for acute gout treatment. Be sure you  understand your caregiver's directions. Your caregiver may make several adjustments to your medicine dose before these medicines are effective. °¨ Discuss dietary treatment with your caregiver or dietitian. Alcohol and drinks high in sugar and fructose and foods  such as meat, poultry, and seafood can increase uric acid levels. Your caregiver or dietitian can advise you on drinks and foods that should be limited. °HOME CARE INSTRUCTIONS  °· Do not take aspirin to relieve pain. This raises uric acid levels. °· Only take over-the-counter or prescription medicines for pain, discomfort, or fever as directed by your caregiver. °· Rest the joint as much as possible. When in bed, keep sheets and blankets off painful areas. °· Keep the affected joint raised (elevated). °· Apply warm or cold treatments to painful joints. Use of warm or cold treatments depends on which works best for you. °· Use crutches if the painful joint is in your leg. °· Drink enough fluids to keep your urine clear or pale yellow. This helps your body get rid of uric acid. Limit alcohol, sugary drinks, and fructose drinks. °· Follow your dietary instructions. Pay careful attention to the amount of protein you eat. Your daily diet should emphasize fruits, vegetables, whole grains, and fat-free or low-fat milk products. Discuss the use of coffee, vitamin C, and cherries with your caregiver or dietitian. These may be helpful in lowering uric acid levels. °· Maintain a healthy body weight. °SEEK MEDICAL CARE IF:  °· You develop diarrhea, vomiting, or any side effects from medicines. °· You do not feel better in 24 hours, or you are getting worse. °SEEK IMMEDIATE MEDICAL CARE IF:  °· Your joint becomes suddenly more tender, and you have chills or a fever. °MAKE SURE YOU:  °· Understand these instructions. °· Will watch your condition. °· Will get help right away if you are not doing well or get worse. °Document Released: 03/18/2000 Document Revised: 08/05/2013 Document Reviewed: 11/02/2011 °ExitCare® Patient Information ©2015 [employer]. This information is not intended to replace advice given to you by your health care provider. Make sure you discuss any questions you have with your health care  provider. ° ° °Emergency Department Resource Guide °1) Find a Doctor and Pay Out of Pocket °Although you won't have to find out who is covered by your insurance plan, it is a good idea to ask around and get recommendations. You will then need to call the office and see if the doctor you have chosen will accept you as a new patient and what types of options they offer for patients who are self-pay. Some doctors offer discounts or will set up payment plans for their patients who do not have insurance, but you will need to ask so you aren't surprised when you get to your appointment. ° °2) Contact Your Local Health Department °Not all health departments have doctors that can see patients for sick visits, but many do, so it is worth a call to see if yours does. If you don't know where your local health department is, you can check in your phone book. The CDC also has a tool to help you locate your state's health department, and many state websites also have listings of all of their local health departments. ° °3) Find a Walk-in Clinic °If your illness is not likely to be very severe or complicated, you may want to try a walk in clinic. These are popping up all over the country in pharmacies, drugstores, and shopping centers. They're usually staffed by   nurse practitioners or physician assistants that have been trained to treat common illnesses and complaints. They're usually fairly quick and inexpensive. However, if you have serious medical issues or chronic medical problems, these are probably not your best option. ° °No Primary Care Doctor: °- Call Health Connect at  832-8000 - they can help you locate a primary care doctor that  accepts your insurance, provides certain services, etc. °- Physician Referral Service- 1-800-533-3463 ° °Chronic Pain Problems: °Organization         Address  Phone   Notes  °[facility]  (336) 297-2271 Patients need to be referred by their primary care doctor.   ° °Medication Assistance: °Organization         Address  Phone   Notes  °[place] [facility] [address] °[place], [place] [address] (336) 641-8030 --Must be a resident of [place] °-- Must have NO insurance coverage whatsoever (no Medicaid/ Medicare, etc.) °-- The pt. MUST have a primary care doctor that directs their care regularly and follows them in the community °  °[facility]  (866) 331-1348   °[employer]  (888) 892-1162   ° °Agencies that provide inexpensive medical care: °Organization         Address  Phone   Notes  °[facility]  (336) 832-8035   °[facility]    (336) 832-7272   °[facility] [address] °[place], [place] [address] (336) 832-4777   °[facility] [address], °[place] (336) 271-4999   °[facility]    (336) 373-0678   °[facility]    (336) 272-1050   °[facility] ° [address], [place] Phone:  (336) 832-4444, Fax:  (336) 832-4440 Hours of Operation:  9 am - 6 pm, M-F.  Also accepts Medicaid/Medicare and self-pay.  °[facility] ° [address], [place] Phone: (336) [phone], Fax: (336) [phone]. Hours of Operation:  8:30 am - 5:30 pm, M-F.  Also accepts Medicaid and self-pay.  °[facility] [place] [address], [place] Phone: (336) 878-6027   °[facility] [address], [place], [place] (336)723-1848, Ext. 123 Mondays & Thursdays: 7-9 AM.  First 15 patients are seen on a first come, first serve basis. °  ° °Medicaid-accepting [place] Providers: ° °Organization         Address  Phone   Notes  °[facility] [address], [place] (336) 641-2100 Also accepts self-pay patients.  °[facility] [address], [place] ° (336) 856-9996   °[facility] [address], [place] (336) 288-8857   °[facility] [address] [place] [address], [place] (336) 299-7000   °Veita Bland [address], [place]  ° (336) 373-1557 Only accepts [place] Access Medicaid patients after they have their name applied to their card.  ° °Self-Pay (no insurance) in [place]: ° °Organization         Address  Phone   Notes  °Sickle Cell Patients, [facility] [address], [place] (336) 832-1970   °[facility] [address], [place] (336) 832-4400   °[facility] ° [address], [place] (336) 992-4800   °[facility]/Dr. Osei-Bonsu °   [address] [place] or [place], [address], [place] [phone] Phone number for both [place] and [place] locations is the same.  [facility] [address] [place] [phone]   [facility] [address] [place] or [address] [phone]   [facility] [address], [place] [phone], phone; [phone], fax Sees patients 1st and 3rd Saturday of every month.  Must not qualify for public or private insurance (i.e. Medicaid, Medicare, [place] Health Choice, Veterans' Benefits)  Household income should be no more than 200% of the poverty level The clinic cannot treat you if you are pregnant or think you are pregnant  Sexually transmitted diseases are not treated at the clinic.    Dental Care: Organization         Address  Phone  Notes  [facility] [place] (269)115-0353 Accepts children up to age 43 who are enrolled in [place] or [place]; pregnant women with a Medicaid card; and children who have applied for Medicaid or [employer] Health Choice, but were declined, whose parents can pay a reduced fee at time of service.   [facility]  [address], [place] [phone] Accepts children up to age 66 who are enrolled in [place] or [place]; pregnant women with a Medicaid card; and children who have applied for Medicaid or [place] Health Choice, but were declined, whose parents can pay a reduced fee at time of service.  [facility]  [place] [phone] Patients are seen by appointment only. Walk-ins are not accepted. [place] will see patients 93 years of age and older. Monday - Tuesday (8am-5pm) Most Wednesdays (8:30-5pm) $30 per visit, cash only  [facility]  [address], [facility] [phone] Patients are seen by appointment only. Walk-ins are not accepted. [place] will see patients 34 years of age and older. One Wednesday Evening (Monthly: Volunteer Based).  $30 per visit, cash only  [place]  (416)272-2699 for adults; Children under age 38, call Graduate Pediatric Dentistry at [phone]. Children aged 43-14, please call [phone] to request a pediatric application.  Dental services are provided in all areas of dental care including fillings, crowns and bridges, complete and partial dentures, implants, gum treatment, root canals, and extractions. Preventive care is also provided. Treatment is provided to both adults and children. Patients are selected via a lottery and there is often a waiting list.   [facility] [address], [place]  [phone] [email or web address]   [facility] [address] [place], [place] [phone], Ext. [phone] Second and Fourth Thursday of each month, opens at 6:30 AM; Clinic ends at 9 AM.  Patients are seen on a first-come first-served basis, and a limited number are seen during each clinic.   [facility]  [address] [place], [place] [phone]   Eligibility Requirements You must have lived in [place], [place], or [place] counties for at least the last three months.   You cannot be eligible for state or federal sponsored [employer], including [employer], [place], or [employer].   You generally cannot be eligible for healthcare insurance through your employer.  How to apply: °Eligibility screenings are held every Tuesday and Wednesday afternoon from 1:00 pm until 4:00 pm. You do not need an appointment for the interview!  °[facility] [address], [place], [place] [phone]   °[place] [facility]  336-342-8273   °[facility]  336-703-3100   °[facility]  336-570-6415   ° °Behavioral Health Resources in the Community: °Intensive Outpatient Programs °Organization         Address  Phone  Notes  °[facility] [address], [place], [place] 336-878-6098   °[facility] [address], [place], [place] 336-832-9800   °[facility] [address], [place], [place] ° 336-882-2125   °[facility] [address],  °[place], [place] 1-800-853-5163 or 336-641-4981   °Substance Abuse Resources °Organization         Address  Phone  Notes  °[facility]  336-882-2125   °[facility]  336-784-9470   °[facility]  336-285-9073   °[facility]  336-845-3988   °Residential & Outpatient Substance Abuse Program  1-800-659-3381   °Psychological Services °Organization         Address  Phone  Notes  °[facility]  336- 832-9600   °[facility]  336- 378-7881   °[facility] [address], [place] 1-800-853-5163 or 336-641-4981   ° °Mobile Crisis Teams °Organization         Address  Phone  Notes  °[facility], Mobile Crisis Care Unit  1-877-626-1772   °Assertive °[facility] ° [address] [place], [place] 336-834-9664   °Sharon DeEsch [address] °[place] 336-554-5454   ° °Self-Help/Support Groups °Organization         Address  Phone             Notes  °[facility] - variety of support groups  336- 373-1402 Call for more information  °Narcotics Anonymous (NA), [facility] [address], °[place]  2 meetings at this location  ° °Residential Treatment Programs °Organization         Address  Phone  Notes  °[facility] [address],    °[place]  1-866-801-8205   °[facility] ° [address], [place], [place] [phone]   °[facility] [address], [place] 336-845-3988 Admissions: 8am-3pm M-F  °[facility] [address]    °[place], [place] 336-841-1104   °[facility] [address], [place], [place] 336-379-7146   °[facility] [address]  °[place], [place] 336-285-9073   °[facility] Intensive Outpatient [address], [place], [place] 336-852-3033   °[facility] [address]  °[place], [place] 1-877-615-2722 or 336-784-9470   °[facility] [address] [place], [place] 336-227-7417 Accepts Medicaid  °[facility] [address]  °[place] 1-800-659-3381 Substance Abuse/Addiction Treatment  ° °[place] Behavioral Health Resources °Organization         Address  Phone  Notes  °[facility]  (888) 581-9988   °Saryiah Bencosme Brannon, PhD [address] [place],    (336) 349-5553 or (336) 951-0000   °[facility]   [address] °[place],  (336) 349-4454   °[facility] [address],   [place], [place] 270-600-5220 Insurance/Medicaid/sponsorship through [facility] and Families [address]                                    [place], [place] [phone]  Therapy/tele-psych/case  [facility] [address]   [place], [place] ([phone]    Dr. Lolly Mustache  (209)[phone]   [facility]  [facility] Dept. 1) [address] [place] 2) [address], [place] 3)  [address], [place] [phone]  [phone]   [facility] Child Abuse Hotline 203-550-8661 or 9011519571 (After Hours)        Take your next dose of prednisone tomorrow morning.  You may take the oxycodone prescribed for pain relief.  This will make you drowsy - do not drive within 4 hours of taking this medication.

## 2013-11-27 NOTE — ED Notes (Signed)
PT states he was seen in ED last week and being tx for gout. PT states medication not effective and increase in pain to left knee.

## 2013-11-27 NOTE — Care Management Note (Signed)
ED/CM noted patient did not have health insurance and/or PCP listed in the computer.  Patient was given the [place] resource handout with information on the clinics, food pantries, and the handout for new health insurance sign-up.  Patient expressed appreciation for information received. Pt was given a Rx discount care, also. 

## 2013-11-28 NOTE — ED Provider Notes (Signed)
CSN: [id number]     Arrival date & time 11/27/13  1324 History   First MD Initiated Contact with Patient 11/27/13 1020     Chief Complaint  Patient presents with  . Gout     (Consider location/radiation/quality/duration/timing/severity/associated sxs/prior Treatment) HPI  Mark Acosta is a 43 y.o. male presenting for treatment of his left knee gout.  He was seen here 5 days ago for the same concern and was treated with colchicine(with 3 doses given here) and a prescription for allopurinol.  He was also given a prescription for daily colchicine use which in the past has helped reduce the frequency his gout flares.  However he is unable to afford this medication at this time.  He is taking no other medications for his symptoms.  He was prescribed oxycodone which was controlling the pain which he has completed. He has exquisite constant pain in his left knee which is worse with weightbearing, and states he cannot afford to miss work and must stand for the majority of his day.  His pain is consistent with prior gout attacks.  He denies injury to the joint stating he developed pain and swelling while he was sleeping one week ago.  Past Medical History  Diagnosis Date  . Gout   . Diverticula of colon    History reviewed. No pertinent past surgical history. No family history on file. History  Substance Use Topics  . Smoking status: Never Smoker   . Smokeless tobacco: Not on file  . Alcohol Use: Yes     Comment: beer once or twice a week    Review of Systems  Constitutional: Negative for fever.  Musculoskeletal: Positive for arthralgias and joint swelling. Negative for myalgias.  Neurological: Negative for weakness and numbness.      Allergies  Review of patient's allergies indicates no known allergies.  Home Medications   Prior to Admission medications   Medication Sig Start Date End Date Taking? Authorizing Provider  allopurinol (ZYLOPRIM) 100 MG tablet Take 1.5 tablets (150 mg  total) by mouth daily. 11/22/13  Yes Mora Bellman, PA-C  ibuprofen (ADVIL,MOTRIN) 200 MG tablet Take 600 mg by mouth daily as needed for moderate pain.   Yes Historical Provider, MD  naproxen sodium (ANAPROX) 220 MG tablet Take 660 mg by mouth daily as needed (pain).   Yes Historical Provider, MD  oxyCODONE-acetaminophen (PERCOCET/ROXICET) 5-325 MG per tablet Take 1 tablet by mouth every 4 (four) hours as needed. 11/27/13   Burgess Amor, PA-C  predniSONE (DELTASONE) 10 MG tablet 6, 5, 4, 3, 2 then 1 tablet by mouth daily for 6 days total. 11/28/13   Burgess Amor, PA-C   BP 141/91  Pulse 76  Temp(Src) 98.2 F (36.8 C) (Oral)  Resp 16  Ht  (1.676 m)  Wt 235 lb (106.595 kg)  BMI 37.95 kg/m2  SpO2 98% Physical Exam  Constitutional: He appears well-developed and well-nourished.  HENT:  Head: Atraumatic.  Neck: Normal range of motion.  Cardiovascular:  Pulses equal bilaterally  Musculoskeletal: He exhibits edema and tenderness.       Left knee: He exhibits swelling. He exhibits no deformity, no erythema, no LCL laxity, normal meniscus and no MCL laxity. Tenderness found. Lateral joint line tenderness noted.  Swelling is mild.  No erythema.  No bruising.  Skin is intact.  The joint is slightly warm.  He does display range of motion of the joint although with discomfort.  There is no red streaking, no calf edema.  Neurological: He is alert. He has normal strength. He displays normal reflexes. No sensory deficit.  Skin: Skin is warm and dry.  Psychiatric: He has a normal mood and affect.    ED Course  Procedures (including critical care time) Labs Review Labs Reviewed - No data to display  Imaging Review No results found.   EKG Interpretation None      MDM   Final diagnoses:  Acute gout of left knee, unspecified cause    Patient with acute gout which has not responded to colchicine.  He continues to take allopurinol which he is able to afford.  He has not been on any  anti-inflammatories.  He was given prednisone 60 mg by mouth.  He was placed on a six-day prednisone taper.  Oxycodone was   prescribed.  Encouraged to minimize weightbearing as much as possible.  Elevation, warm compresses.  Patient has no exam findings or history to suggest infected joint.  He was given referrals for obtaining primary care.  In the interim encouraged to return here for any worsened or persistent symptoms.   Burgess Amor, PA-C 11/28/13 1949

## 2013-11-29 NOTE — ED Provider Notes (Signed)
Medical screening examination/treatment/procedure(s) were performed by non-physician practitioner and as supervising physician I was immediately available for consultation/collaboration.    Linwood Dibbles, MD 11/29/13 1332

## 2014-01-02 ENCOUNTER — Emergency Department (HOSPITAL_COMMUNITY)
Admission: EM | Admit: 2014-01-02 | Discharge: 2014-01-02 | Disposition: A | Discharge status: Home / Self Care | Payer: Self-pay | Attending: Emergency Medicine | Admitting: Emergency Medicine

## 2014-01-02 ENCOUNTER — Encounter (HOSPITAL_COMMUNITY): Payer: Self-pay | Admitting: Emergency Medicine

## 2014-01-02 DIAGNOSIS — M10072 Idiopathic gout, left ankle and foot: Secondary | ICD-10-CM | POA: Insufficient documentation

## 2014-01-02 DIAGNOSIS — M79672 Pain in left foot: Secondary | ICD-10-CM

## 2014-01-02 DIAGNOSIS — Z7952 Long term (current) use of systemic steroids: Secondary | ICD-10-CM | POA: Insufficient documentation

## 2014-01-02 DIAGNOSIS — M109 Gout, unspecified: Secondary | ICD-10-CM

## 2014-01-02 DIAGNOSIS — Z8719 Personal history of other diseases of the digestive system: Secondary | ICD-10-CM | POA: Insufficient documentation

## 2014-01-02 DIAGNOSIS — Z79899 Other long term (current) drug therapy: Secondary | ICD-10-CM | POA: Insufficient documentation

## 2014-01-02 MED ORDER — KETOROLAC TROMETHAMINE 10 MG PO TABS
10.0000 mg | ORAL_TABLET | Freq: Once | ORAL | Status: AC
  Administered 2014-01-02: 10 mg via ORAL
  Filled 2014-01-02: qty 1

## 2014-01-02 MED ORDER — COLCHICINE 0.6 MG PO TABS
1.2000 mg | ORAL_TABLET | Freq: Once | ORAL | Status: AC
  Administered 2014-01-02: 1.2 mg via ORAL
  Filled 2014-01-02: qty 2

## 2014-01-02 MED ORDER — PREDNISONE 50 MG PO TABS
60.0000 mg | ORAL_TABLET | Freq: Once | ORAL | Status: AC
  Administered 2014-01-02: 60 mg via ORAL
  Filled 2014-01-02 (×2): qty 1

## 2014-01-02 MED ORDER — COLCHICINE 0.6 MG PO TABS: 0.6000 mg | ORAL_TABLET | Freq: Two times a day (BID) | ORAL | Status: DC

## 2014-01-02 MED ORDER — HYDROCODONE-ACETAMINOPHEN 5-325 MG PO TABS
2.0000 | ORAL_TABLET | Freq: Once | ORAL | Status: AC
  Administered 2014-01-02: 2 via ORAL
  Filled 2014-01-02: qty 2

## 2014-01-02 MED ORDER — COLCHICINE 0.6 MG PO TABS
ORAL_TABLET | ORAL | Status: AC
  Filled 2014-01-02: qty 1

## 2014-01-02 MED ORDER — DEXAMETHASONE 4 MG PO TABS: ORAL_TABLET | ORAL | Status: DC

## 2014-01-02 MED ORDER — HYDROCODONE-ACETAMINOPHEN 7.5-325 MG PO TABS: 1.0000 | ORAL_TABLET | ORAL | Status: DC | PRN

## 2014-01-02 MED ORDER — ONDANSETRON HCL 4 MG PO TABS
4.0000 mg | ORAL_TABLET | Freq: Once | ORAL | Status: AC
  Administered 2014-01-02: 4 mg via ORAL
  Filled 2014-01-02: qty 1

## 2014-01-02 NOTE — Discharge Instructions (Signed)
Please use 600 mg of ibuprofen with each meal and at bedtime daily until this episode resolves. Please use Decadron 2 times daily with food, and colchicine  2 times daily with food. Use Norco every 4 hours if needed for pain. This medication may cause drowsiness, please use with caution.

## 2014-01-02 NOTE — ED Notes (Signed)
Lt foot pain  That pt  Says is due to gout, Has not taken gout meds for 2 mos.  No swelling, denies injury. .Marland Kitchen

## 2014-01-02 NOTE — ED Provider Notes (Signed)
CSN: [id number]     Arrival date & time 01/02/14  1303 History   First MD Initiated Contact with Patient 01/02/14 1406     Chief Complaint  Patient presents with  . Gout     (Consider location/radiation/quality/duration/timing/severity/associated sxs/prior Treatment) HPI Comments: Pt reports hx of gout. He feels this is a similar event. No injury to the left foot. No high fevers reported.  Patient is a 43 y.o. male presenting with lower extremity pain. The history is provided by the patient.  Foot Pain This is a recurrent problem. The current episode started in the past 7 days. The problem occurs intermittently. The problem has been gradually worsening. Associated symptoms include arthralgias. Pertinent negatives include no fever, numbness or weakness. The symptoms are aggravated by standing and walking. He has tried nothing for the symptoms. The treatment provided no relief.    Past Medical History  Diagnosis Date  . Gout   . Diverticula of colon    History reviewed. No pertinent past surgical history. History reviewed. No pertinent family history. History  Substance Use Topics  . Smoking status: Never Smoker   . Smokeless tobacco: Not on file  . Alcohol Use: No    Review of Systems  Constitutional: Negative for fever.  Musculoskeletal: Positive for arthralgias.  Neurological: Negative for weakness and numbness.      Allergies  Review of patient's allergies indicates no known allergies.  Home Medications   Prior to Admission medications   Medication Sig Start Date End Date Taking? Authorizing Provider  allopurinol (ZYLOPRIM) 100 MG tablet Take 1.5 tablets (150 mg total) by mouth daily. 11/22/13   Mora Bellman, PA-C  ibuprofen (ADVIL,MOTRIN) 200 MG tablet Take 600 mg by mouth daily as needed for moderate pain.    Historical Provider, MD  naproxen sodium (ANAPROX) 220 MG tablet Take 660 mg by mouth daily as needed (pain).    Historical Provider, MD    oxyCODONE-acetaminophen (PERCOCET/ROXICET) 5-325 MG per tablet Take 1 tablet by mouth every 4 (four) hours as needed. 11/27/13   Burgess Amor, PA-C  predniSONE (DELTASONE) 10 MG tablet 6, 5, 4, 3, 2 then 1 tablet by mouth daily for 6 days total. 11/28/13   Burgess Amor, PA-C   BP 168/96  Pulse 106  Temp(Src) 98 F (36.7 C) (Oral)  Resp 18  Ht 5\' 6"  (1.676 m)  Wt 260 lb (117.935 kg)  BMI 41.99 kg/m2  SpO2 99% Physical Exam  Nursing note and vitals reviewed. Constitutional: He is oriented to person, place, and time. He appears well-developed and well-nourished.  Non-toxic appearance.  HENT:  Head: Normocephalic.  Right Ear: Tympanic membrane and external ear normal.  Left Ear: Tympanic membrane and external ear normal.  Eyes: EOM and lids are normal. Pupils are equal, round, and reactive to light.  Neck: Normal range of motion. Neck supple. Carotid bruit is not present.  Cardiovascular: Normal rate, regular rhythm, normal heart sounds, intact distal pulses and normal pulses.   Pulmonary/Chest: Breath sounds normal. No respiratory distress.  Abdominal: Soft. Bowel sounds are normal. There is no tenderness. There is no guarding.  Musculoskeletal:       Left foot: He exhibits decreased range of motion and tenderness. He exhibits normal capillary refill and no deformity.       Feet:  Lymphadenopathy:       Head (right side): No submandibular adenopathy present.       Head (left side): No submandibular adenopathy present.    He has  no cervical adenopathy.  Neurological: He is alert and oriented to person, place, and time. He has normal strength. No cranial nerve deficit or sensory deficit.  Skin: Skin is warm and dry.  Psychiatric: He has a normal mood and affect. His speech is normal.    ED Course  Procedures (including critical care time) Labs Review Labs Reviewed - No data to display  Imaging Review No results found.   EKG Interpretation None      MDM  Patient has history  of gout. He states that this episode feels similar to previous gout attacks. No neurovascular compromise appreciated on examination. No temperature elevations appreciated. Patient to be treated with Decadron, Norco, colchicine, and ibuprofen.    Final diagnoses:  None    **I have reviewed nursing notes, vital signs, and all appropriate lab and imaging results for this patient.Kathie Dike*    Jeffery Gammell M Annmargaret Decaprio, PA-C 01/02/14 1447

## 2014-01-02 NOTE — ED Notes (Signed)
Pt c/o gout flare up to left foot for couple of days

## 2014-01-02 NOTE — ED Provider Notes (Signed)
Medical screening examination/treatment/procedure(s) were performed by non-physician practitioner and as supervising physician I was immediately available for consultation/collaboration.   EKG Interpretation None        Yoon Barca, MD 01/02/14 1617 

## 2014-07-21 ENCOUNTER — Encounter (HOSPITAL_COMMUNITY): Payer: Self-pay | Admitting: Emergency Medicine

## 2014-07-21 ENCOUNTER — Emergency Department (HOSPITAL_COMMUNITY): Payer: Self-pay

## 2014-07-21 ENCOUNTER — Emergency Department (HOSPITAL_COMMUNITY)
Admission: EM | Admit: 2014-07-21 | Discharge: 2014-07-21 | Disposition: A | Discharge status: Home / Self Care | Payer: Self-pay | Attending: Emergency Medicine | Admitting: Emergency Medicine

## 2014-07-21 DIAGNOSIS — Z79899 Other long term (current) drug therapy: Secondary | ICD-10-CM | POA: Insufficient documentation

## 2014-07-21 DIAGNOSIS — M109 Gout, unspecified: Secondary | ICD-10-CM | POA: Insufficient documentation

## 2014-07-21 DIAGNOSIS — Z8719 Personal history of other diseases of the digestive system: Secondary | ICD-10-CM | POA: Insufficient documentation

## 2014-07-21 DIAGNOSIS — M25511 Pain in right shoulder: Secondary | ICD-10-CM | POA: Insufficient documentation

## 2014-07-21 MED ORDER — CYCLOBENZAPRINE HCL 10 MG PO TABS: 10.0000 mg | ORAL_TABLET | Freq: Two times a day (BID) | ORAL | Status: DC | PRN

## 2014-07-21 MED ORDER — OXYCODONE-ACETAMINOPHEN 5-325 MG PO TABS
1.0000 | ORAL_TABLET | Freq: Once | ORAL | Status: AC
  Administered 2014-07-21: 1 via ORAL
  Filled 2014-07-21: qty 1

## 2014-07-21 MED ORDER — INDOMETHACIN 25 MG PO CAPS: 25.0000 mg | ORAL_CAPSULE | Freq: Three times a day (TID) | ORAL | Status: DC | PRN

## 2014-07-21 MED ORDER — HYDROCODONE-ACETAMINOPHEN 5-325 MG PO TABS: 1.0000 | ORAL_TABLET | ORAL | Status: DC | PRN

## 2014-07-21 NOTE — Discharge Instructions (Signed)
You will need to follow up with Dr. Romeo AppleHarrison for further evaluation of your shoulder pain.

## 2014-07-21 NOTE — ED Provider Notes (Signed)
CSN: [id number]     Arrival date & time 07/21/14  4098 History  This chart was scribed for non-physician practitioner Kerrie Buffalo, NP, working with Rolland Porter, MD by Littie Deeds, ED Scribe. This patient was seen in room APFT23/APFT23 and the patient's care was started at 11:29 AM.      Chief Complaint  Patient presents with  . Shoulder Pain   Patient is a 44 y.o. male presenting with shoulder pain. The history is provided by the patient. No language interpreter was used.  Shoulder Pain Location:  Shoulder Shoulder location:  R shoulder Pain details:    Quality:  Throbbing   Severity:  Severe   Onset quality:  Gradual   Duration:  5 months   Progression:  Worsening Prior injury to area:  Yes Relieved by:  Narcotics Worsened by:  Movement Associated symptoms: decreased range of motion and numbness   Associated symptoms: no neck pain    HPI Comments: Mark Acosta is a 44 y.o. male with a hx of gout who presents to the Emergency Department complaining of gradual onset, severe, worsening, throbbing, right shoulder pain that started 5 months ago. Patient also reports having occasional hand numbness and limited ROM of the right shoulder. He notes the pain "shoots between the muscle and the bone." He also notes having some muscle spasm 4 months ago The pain is worsened with movement of the right shoulder; he states it does not hurt if he keeps his shoulder still. He has tried Microbiologist (he had 3 leftover from previously) with relief. He has also tried OTC medications, including ibuprofen and Aleve, but without relief. Patient denies chest pain, SOB, nausea, vomiting and neck pain. He denies recent injury, but he does report an injury to his right shoulder about 18 years ago while he was doing bench presses. He works in [employer] and does use his shoulders often.   Past Medical History  Diagnosis Date  . Gout   . Diverticula of colon    History reviewed. No pertinent past surgical  history. History reviewed. No pertinent family history. History  Substance Use Topics  . Smoking status: Never Smoker   . Smokeless tobacco: Not on file  . Alcohol Use: No    Review of Systems  Respiratory: Negative for shortness of breath.   Cardiovascular: Negative for chest pain.  Gastrointestinal: Negative for nausea and vomiting.  Musculoskeletal: Positive for arthralgias. Negative for neck pain.  Neurological: Positive for numbness.      Allergies  Review of patient's allergies indicates no known allergies.  Home Medications   Prior to Admission medications   Medication Sig Start Date End Date Taking? Authorizing Provider  allopurinol (ZYLOPRIM) 100 MG tablet Take 1.5 tablets (150 mg total) by mouth daily. Patient not taking: Reported on 07/21/2014 11/22/13   Junious Silk, PA-C  colchicine 0.6 MG tablet Take 1 tablet (0.6 mg total) by mouth 2 (two) times daily. Patient not taking: Reported on 07/21/2014 01/02/14   Ivery Quale, PA-C  cyclobenzaprine (FLEXERIL) 10 MG tablet Take 1 tablet (10 mg total) by mouth 2 (two) times daily as needed for muscle spasms. 07/21/14   Kalista Laguardia Orlene Och, NP  HYDROcodone-acetaminophen (NORCO/VICODIN) 5-325 MG per tablet Take 1 tablet by mouth every 4 (four) hours as needed. 07/21/14   Azam Gervasi Orlene Och, NP  indomethacin (INDOCIN) 25 MG capsule Take 1 capsule (25 mg total) by mouth 3 (three) times daily as needed. 07/21/14   Corrissa Martello Orlene Och, NP   BP 139/84  mmHg  Pulse 103  Temp(Src) 98.7 F (37.1 C) (Oral)  Resp 18  Ht 5\' 6"  (1.676 m)  Wt 280 lb (127.007 kg)  BMI 45.21 kg/m2  SpO2 97% Physical Exam  Constitutional: He is oriented to person, place, and time. He appears well-developed and well-nourished.  Eyes: EOM are normal.  Neck: Neck supple.  Cardiovascular: Normal rate, regular rhythm and normal heart sounds.   No murmur heard. Pulmonary/Chest: Effort normal and breath sounds normal. No respiratory distress. He has no wheezes. He has no rales.   CTAB.  Abdominal: Soft. There is no tenderness.  Musculoskeletal: Normal range of motion. He exhibits tenderness.  Right: Tenderness over right rotator cuff with palpation and ROM. Tender over AC joint. Full ROM of hand and wrist. No deformity noted.  Neurological: He is alert and oriented to person, place, and time. No cranial nerve deficit.  Grip strengths are equal.  Skin: Skin is warm and dry.  Nursing note and vitals reviewed.   ED Course  Procedures  DIAGNOSTIC STUDIES: Oxygen Saturation is 97% on room air, normal by my interpretation.    COORDINATION OF CARE: 11:37 AM-Discussed treatment plan which includes pain medication and sling with pt at bedside and pt agreed to plan.    Labs Review Labs Reviewed - No data to display  Imaging Review Dg Shoulder Right  07/21/2014   CLINICAL DATA:  Chronic progressive right shoulder pain since November 2015. Limited range of motion.  EXAM: RIGHT SHOULDER - 2+ VIEW  COMPARISON:  None.  FINDINGS: There are slight arthritic changes of the glenohumeral joint and of the acromioclavicular joint. No soft tissue calcifications. No joint space narrowing.  IMPRESSION: No acute abnormality. 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   Electronically Signed   By: Francene BoyersJames  Maxwell M.D.   On: 07/21/2014 10:13    MDM  44 y.o. male with pain to the right shoulder x 6 months that has gotten worse. Stable for discharge without neurovascular compromise. Sling, NSAIDS, ice, pain management and follow up with ortho. Discussed with the patient clinical and x-ray findings all questioned fully answered. He will follow up with ortho or return here if any problems arise.   Final diagnoses:  Shoulder pain, right   I personally performed the services described in this documentation, which was scribed in my presence. The recorded information has been reviewed and is accurate.   [place] M Nicolemarie Wooley, [place]NP 07/22/14 1653  Rolland PorterMark James,  MD 07/30/14 [phone]

## 2014-07-21 NOTE — ED Notes (Signed)
Pt verbalized understanding of no driving and to use caution within 4 hours of taking pain meds due to meds cause drowsiness 

## 2014-07-21 NOTE — ED Notes (Signed)
Pt reports R shoulder pain since Nov. Pt denies known injury, has not seen MD. Pt states the pain has become progressively worse. Limited ROM of R shoulder.

## 2014-07-30 ENCOUNTER — Encounter (HOSPITAL_COMMUNITY): Payer: Self-pay | Admitting: Emergency Medicine

## 2014-07-30 ENCOUNTER — Emergency Department (HOSPITAL_COMMUNITY)
Admission: EM | Admit: 2014-07-30 | Discharge: 2014-07-30 | Disposition: A | Discharge status: Home / Self Care | Payer: Self-pay | Attending: Emergency Medicine | Admitting: Emergency Medicine

## 2014-07-30 DIAGNOSIS — M25511 Pain in right shoulder: Secondary | ICD-10-CM | POA: Insufficient documentation

## 2014-07-30 DIAGNOSIS — Z8719 Personal history of other diseases of the digestive system: Secondary | ICD-10-CM | POA: Insufficient documentation

## 2014-07-30 DIAGNOSIS — M109 Gout, unspecified: Secondary | ICD-10-CM | POA: Insufficient documentation

## 2014-07-30 DIAGNOSIS — Z791 Long term (current) use of non-steroidal anti-inflammatories (NSAID): Secondary | ICD-10-CM | POA: Insufficient documentation

## 2014-07-30 DIAGNOSIS — Z79899 Other long term (current) drug therapy: Secondary | ICD-10-CM | POA: Insufficient documentation

## 2014-07-30 MED ORDER — HYDROCODONE-ACETAMINOPHEN 5-325 MG PO TABS: 1.0000 | ORAL_TABLET | ORAL | Status: DC | PRN

## 2014-07-30 MED ORDER — DEXAMETHASONE 4 MG PO TABS: 4.0000 mg | ORAL_TABLET | Freq: Two times a day (BID) | ORAL | Status: DC

## 2014-07-30 NOTE — Discharge Instructions (Signed)
Please see the orthopedic specialist as scheduled. Heating pad to your shoulder may be helpful. Please use medications as prescribed. Norco may cause drowsiness, please use with caution. Please see M.D. at the [facility], or the [facility] [place] to establish a primary physician, and assist with your pain management. Shoulder Pain The shoulder is the joint that connects your arm to your body. Muscles and band-like tissues that connect bones to muscles (tendons) hold the joint together. Shoulder pain is felt if an injury or medical problem affects one or more parts of the shoulder. HOME CARE   Put ice on the sore area.  Put ice in a plastic bag.  Place a towel between your skin and the bag.  Leave the ice on for 15-20 minutes, 03-04 times a day for the first 2 days.  Stop using cold packs if they do not help with the pain.  If you were given something to keep your shoulder from moving (sling; shoulder immobilizer), wear it as told. Only take it off to shower or bathe.  Move your arm as little as possible, but keep your hand moving to prevent puffiness (swelling).  Squeeze a soft ball or foam pad as much as possible to help prevent swelling.  Take medicine as told by your doctor. GET HELP IF:  You have progressing new pain in your arm, hand, or fingers.  Your hand or fingers get cold.  Your medicine does not help lessen your pain. GET HELP RIGHT AWAY IF:   Your arm, hand, or fingers are numb or tingling.  Your arm, hand, or fingers are puffy (swollen), painful, or turn white or blue. MAKE SURE YOU:   Understand these instructions.  Will watch your condition.  Will get help right away if you are not doing well or get worse. Document Released: 09/07/2007 Document Revised: 08/05/2013 Document Reviewed: 10/03/2011 [facility] Patient Information 2015 [place]. This information is not intended to replace advice given to you by your  health care provider. Make sure you discuss any questions you have with your health care provider.

## 2014-07-30 NOTE — ED Notes (Signed)
Pt reports seen for same on Monday. Pt reports muscle relaxers are not helping with pain. nad noted.

## 2014-07-30 NOTE — ED Provider Notes (Signed)
CSN: [id number]     Arrival date & time 07/30/14  0910 History   First MD Initiated Contact with Patient 07/30/14 [phone]     Chief Complaint  Patient presents with  . Shoulder Pain     (Consider location/radiation/quality/duration/timing/severity/associated sxs/prior Treatment) HPI Comments: Patient is a 44 year old male who presents to the emergency department with complaint of right shoulder pain. The patient states that he has had problems with his right shoulder for some time now, approximately 5 or 6 months. He has flareups of this pain from time to time. The patient states he currently does not have a physician, he is scheduled to see an orthopedic specialist, but cannot see the specialist until next week after he gets paid. The patient was recently seen in the emergency department on April 18 for shoulder pain he was treated but he states that the medication including a muscle relaxer is not helping his pain, and that he cannot work because he can't raise his arm,. He presents to the emergency department at this time for reevaluation of his shoulder, and to seek assistance with his shoulder pain.  Patient is a 44 y.o. male presenting with shoulder pain. The history is provided by the patient.  Shoulder Pain Location:  Shoulder Shoulder location:  R shoulder Pain details:    Quality:  Aching Handedness:  Right-handed Associated symptoms: no back pain and no neck pain     Past Medical History  Diagnosis Date  . Gout   . Diverticula of colon    History reviewed. No pertinent past surgical history. History reviewed. No pertinent family history. History  Substance Use Topics  . Smoking status: Never Smoker   . Smokeless tobacco: Not on file  . Alcohol Use: No    Review of Systems  Constitutional: Negative for activity change.       All ROS Neg except as noted in HPI  HENT: Negative for nosebleeds.   Eyes: Negative for photophobia and discharge.  Respiratory: Negative for  cough, shortness of breath and wheezing.   Cardiovascular: Negative for chest pain and palpitations.  Gastrointestinal: Negative for abdominal pain and blood in stool.  Genitourinary: Negative for dysuria, frequency and hematuria.  Musculoskeletal: Positive for arthralgias. Negative for back pain and neck pain.  Skin: Negative.   Neurological: Negative for dizziness, seizures and speech difficulty.  Psychiatric/Behavioral: Negative for hallucinations and confusion.      Allergies  Review of patient's allergies indicates no known allergies.  Home Medications   Prior to Admission medications   Medication Sig Start Date End Date Taking? Authorizing Provider  allopurinol (ZYLOPRIM) 100 MG tablet Take 1.5 tablets (150 mg total) by mouth daily. Patient not taking: Reported on 07/21/2014 11/22/13   Junious SilkHannah Merrell, PA-C  colchicine 0.6 MG tablet Take 1 tablet (0.6 mg total) by mouth 2 (two) times daily. Patient not taking: Reported on 07/21/2014 01/02/14   Ivery QualeHobson Kelyn Ponciano, PA-C  cyclobenzaprine (FLEXERIL) 10 MG tablet Take 1 tablet (10 mg total) by mouth 2 (two) times daily as needed for muscle spasms. 07/21/14   Hope Orlene OchM Neese, NP  HYDROcodone-acetaminophen (NORCO/VICODIN) 5-325 MG per tablet Take 1 tablet by mouth every 4 (four) hours as needed. 07/21/14   Hope Orlene OchM Neese, NP  indomethacin (INDOCIN) 25 MG capsule Take 1 capsule (25 mg total) by mouth 3 (three) times daily as needed. 07/21/14   Hope Orlene OchM Neese, NP   BP 160/83 mmHg  Pulse 113  Temp(Src) 97.5 F (36.4 C) (Oral)  Resp 18  Ht  (1.651 m)  Wt 280 lb (127.007 kg)  BMI 46.59 kg/m2  SpO2 96% Physical Exam  Constitutional: He is oriented to person, place, and time. He appears well-developed and well-nourished.  Non-toxic appearance.  HENT:  Head: Normocephalic.  Right Ear: Tympanic membrane and external ear normal.  Left Ear: Tympanic membrane and external ear normal.  Eyes: EOM and lids are normal. Pupils are equal, round, and reactive  to light.  Neck: Normal range of motion. Neck supple. Carotid bruit is not present.  Cardiovascular: Normal rate, regular rhythm, normal heart sounds, intact distal pulses and normal pulses.   Pulmonary/Chest: Breath sounds normal. No respiratory distress.  Abdominal: Soft. Bowel sounds are normal. There is no tenderness. There is no guarding.  Musculoskeletal: Normal range of motion.  There is pain to adduction and abduction of the right shoulder. There is some limitation to range of motion. There is pain to the anterior right shoulder to palpation. Full range of motion noted of the right elbow, wrist, and fingers. Radial pulse is 2+. Capillary refill is less than 2 seconds. No hot joints noted of the right or left upper extremities.  Lymphadenopathy:       Head (right side): No submandibular adenopathy present.       Head (left side): No submandibular adenopathy present.    He has no cervical adenopathy.  Neurological: He is alert and oriented to person, place, and time. He has normal strength. No cranial nerve deficit or sensory deficit.  Grip is symmetrical. No motor sensory deficits of the upper or lower extremities.  Skin: Skin is warm and dry.  Psychiatric: He has a normal mood and affect. His speech is normal.  Nursing note and vitals reviewed.   ED Course  Procedures (including critical care time) Labs Review Labs Reviewed - No data to display  Imaging Review No results found.   EKG Interpretation None      MDM  Vital signs are nonacute. Pulse oximetry is 96% on room air. Within normal limits by my interpretation.  I have reviewed the previous emergency department visits concerning right shoulder pain. The examination shows very little change. There no acute neurovascular deficits noted. The examination favors a flare of the ongoing shoulder problem. The patient states he has a appointment with orthopedic specialist, but cannot see the specialist until after he is paid for  next week. Patient does not have a primary physician. Discussed with patient the need for a primary physician to [place] cyst with reassessments, as well as pain management. Prescription for Decadron and Norco given to the patient. Resource information for [facility] and [facility] given to the patient.    Final diagnoses:  None    *I have reviewed nursing notes, vital signs, and all appropriate lab and imaging results for this patient.[address] PA-C 07/30/14 1045  Zadie Rhine, MD 07/31/14 [phone]

## 2014-09-02 ENCOUNTER — Encounter (HOSPITAL_COMMUNITY): Payer: Self-pay | Admitting: Radiology

## 2014-09-02 ENCOUNTER — Emergency Department (HOSPITAL_COMMUNITY)
Admission: EM | Admit: 2014-09-02 | Discharge: 2014-09-02 | Disposition: A | Discharge status: Home / Self Care | Payer: Self-pay | Attending: Emergency Medicine | Admitting: Emergency Medicine

## 2014-09-02 ENCOUNTER — Emergency Department (HOSPITAL_COMMUNITY): Payer: Self-pay

## 2014-09-02 DIAGNOSIS — Z79899 Other long term (current) drug therapy: Secondary | ICD-10-CM | POA: Insufficient documentation

## 2014-09-02 DIAGNOSIS — M109 Gout, unspecified: Secondary | ICD-10-CM | POA: Insufficient documentation

## 2014-09-02 DIAGNOSIS — K6289 Other specified diseases of anus and rectum: Secondary | ICD-10-CM | POA: Insufficient documentation

## 2014-09-02 DIAGNOSIS — R1032 Left lower quadrant pain: Secondary | ICD-10-CM

## 2014-09-02 LAB — CBC WITH DIFFERENTIAL/PLATELET
Basophils Absolute: 0 10*3/uL (ref 0.0–0.1)
Basophils Relative: 0 % (ref 0–1)
Eosinophils Absolute: 0.2 10*3/uL (ref 0.0–0.7)
Eosinophils Relative: 2 % (ref 0–5)
HCT: 41.7 % (ref 39.0–52.0)
Hemoglobin: 14 g/dL (ref 13.0–17.0)
Lymphocytes Relative: 18 % (ref 12–46)
Lymphs Abs: 1.5 10*3/uL (ref 0.7–4.0)
MCH: 28.9 pg (ref 26.0–34.0)
MCHC: 33.6 g/dL (ref 30.0–36.0)
MCV: 86.2 fL (ref 78.0–100.0)
Monocytes Absolute: 0.5 10*3/uL (ref 0.1–1.0)
Monocytes Relative: 6 % (ref 3–12)
Neutro Abs: 6.3 10*3/uL (ref 1.7–7.7)
Neutrophils Relative %: 74 % (ref 43–77)
Platelets: 215 10*3/uL (ref 150–400)
RBC: 4.84 MIL/uL (ref 4.22–5.81)
RDW: 13.1 % (ref 11.5–15.5)
WBC: 8.6 10*3/uL (ref 4.0–10.5)

## 2014-09-02 LAB — BASIC METABOLIC PANEL
Anion gap: 11 (ref 5–15)
BUN: 13 mg/dL (ref 6–20)
CO2: 26 mmol/L (ref 22–32)
Calcium: 9.1 mg/dL (ref 8.9–10.3)
Chloride: 100 mmol/L — ABNORMAL LOW (ref 101–111)
Creatinine, Ser: 0.72 mg/dL (ref 0.61–1.24)
GFR calc Af Amer: >60 mL/min (ref >60)
GFR calc non Af Amer: >60 mL/min (ref >60)
Glucose, Bld: 193 mg/dL — ABNORMAL HIGH (ref 65–99)
Potassium: 4 mmol/L (ref 3.5–5.1)
Sodium: 137 mmol/L (ref 135–145)

## 2014-09-02 LAB — URINALYSIS, ROUTINE W REFLEX MICROSCOPIC
Bilirubin Urine: NEGATIVE
Glucose, UA: NEGATIVE mg/dL
Hgb urine dipstick: NEGATIVE
Ketones, ur: NEGATIVE mg/dL
Leukocytes, UA: NEGATIVE
Nitrite: NEGATIVE
Protein, ur: NEGATIVE mg/dL
Specific Gravity, Urine: 1.01 (ref 1.005–1.030)
Urobilinogen, UA: 0.2 mg/dL (ref 0.0–1.0)
pH: 7.5 (ref 5.0–8.0)

## 2014-09-02 MED ORDER — IOHEXOL 300 MG/ML  SOLN
25.0000 mL | Freq: Once | INTRAMUSCULAR | Status: AC | PRN
  Administered 2014-09-02: 25 mL via ORAL

## 2014-09-02 MED ORDER — HYDROMORPHONE HCL 1 MG/ML IJ SOLN
1.0000 mg | Freq: Once | INTRAMUSCULAR | Status: AC
  Administered 2014-09-02: 1 mg via INTRAVENOUS
  Filled 2014-09-02: qty 1

## 2014-09-02 MED ORDER — SODIUM CHLORIDE 0.9 % IV BOLUS (SEPSIS)
1000.0000 mL | Freq: Once | INTRAVENOUS | Status: AC
  Administered 2014-09-02: 1000 mL via INTRAVENOUS

## 2014-09-02 MED ORDER — KETOROLAC TROMETHAMINE 30 MG/ML IJ SOLN
15.0000 mg | Freq: Once | INTRAMUSCULAR | Status: AC
  Administered 2014-09-02: 15 mg via INTRAVENOUS
  Filled 2014-09-02: qty 1

## 2014-09-02 MED ORDER — TRAMADOL HCL 50 MG PO TABS: 50.0000 mg | ORAL_TABLET | Freq: Four times a day (QID) | ORAL | Status: DC | PRN

## 2014-09-02 MED ORDER — IOHEXOL 300 MG/ML  SOLN
100.0000 mL | Freq: Once | INTRAMUSCULAR | Status: AC | PRN
  Administered 2014-09-02: 100 mL via INTRAVENOUS

## 2014-09-02 NOTE — Discharge Instructions (Signed)

## 2014-09-02 NOTE — ED Provider Notes (Signed)
CSN: [id number]     Arrival date & time 09/02/14  0920 History  This chart was scribed for Raeford RazorStephen Joseandres Mazer, MD by Tanda RockersMargaux Venter, ED Scribe. This patient was seen in room APA05/APA05 and the patient's care was started at 9:46 AM.    Chief Complaint  Patient presents with  . Abdominal Pain   The history is provided by the patient. No language interpreter was used.    HPI Comments: Mark KluverCurtis Acosta is a 44 y.o. male with hx diverticulitis who presents to the Emergency Department complaining of intermittent LLQ abdominal pain x "years", worsening recently. He describes the pain as a throbbing sensation. Pt notes that he has been dealing with diverticulitis symptoms for years including rectal pain and rectal bleeding. He notes 6-7 bowel movements per day which he states has been his baseline since being diagnosed. Pt mentions having an oily discharge from his rectum as well. Pt mentions eating a hamburger and pizza this weekend which he states has exacerbated his symptoms, prompting him to come to the ED. Denies fever, chills, nausea, vomiting, or any other symptoms. He has never had a colonoscopy in the past.   Past Medical History  Diagnosis Date  . Gout   . Diverticula of colon    No past surgical history on file. No family history on file. History  Substance Use Topics  . Smoking status: Never Smoker   . Smokeless tobacco: Not on file  . Alcohol Use: No    Review of Systems  All other systems reviewed and are negative.   Allergies  Review of patient's allergies indicates no known allergies.  Home Medications   Prior to Admission medications   Medication Sig Start Date End Date Taking? Authorizing Provider  traZODone (DESYREL) 150 MG tablet Take 150 mg by mouth at bedtime.   Yes Historical Provider, MD  allopurinol (ZYLOPRIM) 100 MG tablet Take 1.5 tablets (150 mg total) by mouth daily. Patient not taking: Reported on 07/21/2014 11/22/13   Junious SilkHannah Merrell, PA-C  colchicine 0.6 MG  tablet Take 1 tablet (0.6 mg total) by mouth 2 (two) times daily. Patient not taking: Reported on 07/21/2014 01/02/14   Ivery QualeHobson Bryant, PA-C  cyclobenzaprine (FLEXERIL) 10 MG tablet Take 1 tablet (10 mg total) by mouth 2 (two) times daily as needed for muscle spasms. Patient not taking: Reported on 09/02/2014 07/21/14   Janne NapoleonHope M Neese, NP  dexamethasone (DECADRON) 4 MG tablet Take 1 tablet (4 mg total) by mouth 2 (two) times daily with a meal. Patient not taking: Reported on 09/02/2014 07/30/14   Ivery QualeHobson Bryant, PA-C  HYDROcodone-acetaminophen (NORCO/VICODIN) 5-325 MG per tablet Take 1 tablet by mouth every 4 (four) hours as needed. Patient not taking: Reported on 09/02/2014 07/30/14   Ivery QualeHobson Bryant, PA-C  indomethacin (INDOCIN) 25 MG capsule Take 1 capsule (25 mg total) by mouth 3 (three) times daily as needed. Patient not taking: Reported on 09/02/2014 07/21/14   Janne NapoleonHope M Neese, NP   Triage Vitals: BP 155/85 mmHg  Pulse 99  SpO2 95%   Physical Exam  Constitutional: He is oriented to person, place, and time. He appears well-developed and well-nourished. No distress.  HENT:  Head: Normocephalic and atraumatic.  Eyes: Conjunctivae and EOM are normal. Right eye exhibits no discharge. Left eye exhibits no discharge.  Neck: Neck supple. No tracheal deviation present.  Cardiovascular: Normal rate, regular rhythm and normal heart sounds.  Exam reveals no gallop and no friction rub.   No murmur heard. Pulmonary/Chest: Effort normal and  breath sounds normal. No respiratory distress.  Abdominal: Soft. He exhibits no distension. There is tenderness (LLQ. ). There is no rebound and no guarding.  Genitourinary:  Rectal exam: No external lesions noted. Pain with DRE. No fissure appreciated. Prostate no significantly enlarged, boggy or focally tender. Brown stool noted on glove.   Musculoskeletal: Normal range of motion. He exhibits no edema or tenderness.  Neurological: He is alert and oriented to person, place, and  time.  Skin: Skin is warm and dry.  Psychiatric: He has a normal mood and affect. His behavior is normal. Thought content normal.  Nursing note and vitals reviewed.   ED Course  Procedures (including critical care time)  DIAGNOSTIC STUDIES: Oxygen Saturation is 95% on RA, normal by my interpretation.    COORDINATION OF CARE: 9:52 AM-Discussed treatment plan which includes CT A/P, pain medication with pt at bedside and pt agreed to plan.   Labs Review Labs Reviewed  BASIC METABOLIC PANEL - Abnormal; Notable for the following:    Chloride 100 (*)    Glucose, Bld 193 (*)    All other components within normal limits  CBC WITH DIFFERENTIAL/PLATELET  URINALYSIS, ROUTINE W REFLEX MICROSCOPIC (NOT AT [facility])    Imaging Review Ct Abdomen Pelvis W Contrast  09/02/2014   CLINICAL DATA:  Left lower quadrant and rectal pain with blood in stool  EXAM: CT ABDOMEN AND PELVIS WITH CONTRAST  TECHNIQUE: Multidetector CT imaging of the abdomen and pelvis was performed using the standard protocol following bolus administration of intravenous contrast. Oral contrast was also administered.  CONTRAST:  25mL OMNIPAQUE IOHEXOL 300 MG/ML SOLN, OMNIPAQUE IOHEXOL 300 MG/ML SOLN  COMPARISON:  None.  FINDINGS: Lung bases are clear.  Liver is enlarged, measuring 24.9 cm in length. There is hepatic steatosis. No focal liver lesions are identified. There is no biliary duct dilatation. Gallbladder appears mildly contracted.  Spleen, pancreas, and adrenals appear normal. There is a 1.4 x 1.0 cm cyst in the lower pole the right kidney. There is no demonstrable hydronephrosis on either side. There is no renal or ureteral calculus on either side.  In the pelvis, the urinary bladder is midline with normal wall thickness. There is no pelvic mass or pelvic fluid collection. There is no perirectal thickening. Appendix appears normal.  There is no bowel obstruction.  No free air or portal venous air.  There is no appreciable  ascites, adenopathy, or abscess in the abdomen or pelvis. There is no abdominal aortic aneurysm. There is no blastic or lytic bone lesion.  IMPRESSION: Liver is enlarged with hepatic steatosis.  No bowel obstruction.  No abscess.  Appendix appears normal.  No renal or ureteral calculus.  No hydronephrosis.  No perirectal thickening or stranding.   Electronically Signed   By: Bretta Bang III M.D.   On: 09/02/2014 12:00     EKG Interpretation None      MDM   Final diagnoses:  Left lower quadrant pain  Rectal pain    I personally preformed the services scribed in my presence. The recorded information has been reviewed is accurate. Raeford Razor, MD.      Raeford Razor, MD 09/11/14 [phone]

## 2014-09-02 NOTE — ED Notes (Signed)
Hx diverticulitis, states has flares that come and go, this time has been on going. States got worse on Friday, lower abdominal pain, denies vomiting or diarrhea, states he has 6 to 7 soft BM with blood, and pain in rectum.

## 2014-09-15 ENCOUNTER — Emergency Department (HOSPITAL_COMMUNITY)
Admission: EM | Admit: 2014-09-15 | Discharge: 2014-09-15 | Disposition: A | Discharge status: Home / Self Care | Payer: Self-pay | Attending: Emergency Medicine | Admitting: Emergency Medicine

## 2014-09-15 ENCOUNTER — Encounter (HOSPITAL_COMMUNITY): Payer: Self-pay | Admitting: Emergency Medicine

## 2014-09-15 DIAGNOSIS — K6289 Other specified diseases of anus and rectum: Secondary | ICD-10-CM | POA: Insufficient documentation

## 2014-09-15 DIAGNOSIS — M109 Gout, unspecified: Secondary | ICD-10-CM | POA: Insufficient documentation

## 2014-09-15 LAB — BASIC METABOLIC PANEL
Anion gap: 13 (ref 5–15)
BUN: 18 mg/dL (ref 6–20)
CALCIUM: 9.5 mg/dL (ref 8.9–10.3)
CO2: 24 mmol/L (ref 22–32)
Chloride: 99 mmol/L — ABNORMAL LOW (ref 101–111)
Creatinine, Ser: 0.8 mg/dL (ref 0.61–1.24)
GFR calc Af Amer: >60 mL/min (ref >60)
Glucose, Bld: 151 mg/dL — ABNORMAL HIGH (ref 65–99)
Potassium: 4.1 mmol/L (ref 3.5–5.1)
Sodium: 136 mmol/L (ref 135–145)

## 2014-09-15 LAB — CBC WITH DIFFERENTIAL/PLATELET
BASOS ABS: 0 10*3/uL (ref 0.0–0.1)
BASOS PCT: 0 % (ref 0–1)
EOS PCT: 2 % (ref 0–5)
Eosinophils Absolute: 0.2 10*3/uL (ref 0.0–0.7)
HCT: 44.5 % (ref 39.0–52.0)
Hemoglobin: 15.9 g/dL (ref 13.0–17.0)
Lymphocytes Relative: 18 % (ref 12–46)
Lymphs Abs: 1.5 10*3/uL (ref 0.7–4.0)
MCH: 30.3 pg (ref 26.0–34.0)
MCHC: 35.7 g/dL (ref 30.0–36.0)
MCV: 84.9 fL (ref 78.0–100.0)
MONO ABS: 0.6 10*3/uL (ref 0.1–1.0)
MONOS PCT: 7 % (ref 3–12)
Neutro Abs: 6.4 10*3/uL (ref 1.7–7.7)
Neutrophils Relative %: 73 % (ref 43–77)
Platelets: 248 10*3/uL (ref 150–400)
RBC: 5.24 MIL/uL (ref 4.22–5.81)
RDW: 13 % (ref 11.5–15.5)
WBC: 8.7 10*3/uL (ref 4.0–10.5)

## 2014-09-15 MED ORDER — METRONIDAZOLE 500 MG PO TABS: 500.0000 mg | ORAL_TABLET | Freq: Two times a day (BID) | ORAL | Status: DC

## 2014-09-15 MED ORDER — IBUPROFEN 600 MG PO TABS: 600.0000 mg | ORAL_TABLET | Freq: Three times a day (TID) | ORAL | Status: DC | PRN

## 2014-09-15 MED ORDER — HYDROCORTISONE 2.5 % RE CREA: TOPICAL_CREAM | RECTAL | Status: DC

## 2014-09-15 MED ORDER — OXYCODONE-ACETAMINOPHEN 5-325 MG PO TABS
1.0000 | ORAL_TABLET | Freq: Once | ORAL | Status: AC
  Administered 2014-09-15: 1 via ORAL
  Filled 2014-09-15: qty 1

## 2014-09-15 MED ORDER — KETOROLAC TROMETHAMINE 60 MG/2ML IM SOLN
60.0000 mg | Freq: Once | INTRAMUSCULAR | Status: AC
  Administered 2014-09-15: 60 mg via INTRAMUSCULAR
  Filled 2014-09-15: qty 2

## 2014-09-15 NOTE — ED Notes (Signed)
MD at bedside. 

## 2014-09-15 NOTE — ED Provider Notes (Signed)
CSN: [id number]     Arrival date & time 09/15/14  4098 History  This chart was scribed for Azalia Bilis, MD by Tanda Rockers, ED Scribe. This patient was seen in room APA09/APA09 and the patient's care was started at 9:07 AM.  Chief Complaint  Patient presents with  . Rectal Bleeding   The history is provided by the patient. No language interpreter was used.     HPI Comments: Mark Acosta is a 44 y.o. male with hx diverticulitis who presents to the Emergency Department complaining of gradually worsening rectal bleeding x 3 days. Pt was diagnosed with diverticulitis a few years ago and reports having intermittent rectal bleeding and rectal pain since. Pt has tried to deal with these symptoms on his own by eating earlier in the day, watching what he eats, and taking Ibuprofen for the pain. He mentions that he usually has bright red blood upon wiping but today had very dark blood. Pt mentions that there is also small amounts of blood in his stool at baseline. Denies watery or mucous stools. He also reports that for the last 3 months the rectal pain has been more severe and more frequent. Pt notes that the pain usually lasts about 1-2 weeks before going away. Pt complains of nausea, vomiting, and mild LLQ abdominal pain as well. He was seen in ED on 09/02/2014 (approximately 2 weeks ago) for same symptoms. Pt had CT scan done at that time with no acute findings. [facility] to have blood work and to get a referral to a GI doctor. Denies fever, chills, or any other associated symptoms.     Past Medical History  Diagnosis Date  . Gout   . Diverticula of colon    History reviewed. No pertinent past surgical history. History reviewed. No pertinent family history. History  Substance Use Topics  . Smoking status: Never Smoker   . Smokeless tobacco: Not on file  . Alcohol Use: No    Review of Systems  A complete 10 system review of systems was obtained and all  systems are negative except as noted in the HPI and PMH.    Allergies  Review of patient's allergies indicates no known allergies.  Home Medications   Prior to Admission medications   Medication Sig Start Date End Date Taking? Authorizing Provider  allopurinol (ZYLOPRIM) 100 MG tablet Take 1.5 tablets (150 mg total) by mouth daily. Patient not taking: Reported on 07/21/2014 11/22/13   Junious Silk, PA-C  colchicine 0.6 MG tablet Take 1 tablet (0.6 mg total) by mouth 2 (two) times daily. Patient not taking: Reported on 07/21/2014 01/02/14   Ivery Quale, PA-C  cyclobenzaprine (FLEXERIL) 10 MG tablet Take 1 tablet (10 mg total) by mouth 2 (two) times daily as needed for muscle spasms. Patient not taking: Reported on 09/02/2014 07/21/14   Janne Napoleon, NP  dexamethasone (DECADRON) 4 MG tablet Take 1 tablet (4 mg total) by mouth 2 (two) times daily with a meal. Patient not taking: Reported on 09/02/2014 07/30/14   Ivery Quale, PA-C  HYDROcodone-acetaminophen (NORCO/VICODIN) 5-325 MG per tablet Take 1 tablet by mouth every 4 (four) hours as needed. Patient not taking: Reported on 09/02/2014 07/30/14   Ivery Quale, PA-C  indomethacin (INDOCIN) 25 MG capsule Take 1 capsule (25 mg total) by mouth 3 (three) times daily as needed. Patient not taking: Reported on 09/02/2014 07/21/14   Janne Napoleon, NP  traMADol (ULTRAM) 50 MG tablet Take  1 tablet (50 mg total) by mouth every 6 (six) hours as needed. 09/02/14   Raeford Razor, MD  traZODone (DESYREL) 150 MG tablet Take 150 mg by mouth at bedtime.    Historical Provider, MD   Triage Vitals: BP 147/91 mmHg  Pulse 105  Temp(Src) 98.3 F (36.8 C) (Oral)  Resp 20  Ht 5\' 6"  (1.676 m)  Wt 270 lb (122.471 kg)  BMI 43.60 kg/m2  SpO2 97%   Physical Exam  Constitutional: He is oriented to person, place, and time. He appears well-developed and well-nourished.  HENT:  Head: Normocephalic and atraumatic.  Eyes: EOM are normal.  Neck: Normal range of motion.   Cardiovascular: Normal rate, regular rhythm, normal heart sounds and intact distal pulses.   Pulmonary/Chest: Effort normal and breath sounds normal. No respiratory distress.  Abdominal: Soft. He exhibits no distension. There is tenderness.  Mild LLQ tenderness.   Genitourinary:  No external hemorrhoids  No gross blood Brown stool  No masses felt No palpable internal hemorrhoids   Musculoskeletal: Normal range of motion.  Neurological: He is alert and oriented to person, place, and time.  Skin: Skin is warm and dry.  Psychiatric: He has a normal mood and affect. Judgment normal.  Nursing note and vitals reviewed.   ED Course  Procedures (including critical care time)  DIAGNOSTIC STUDIES: Oxygen Saturation is 97% on RA, normal by my interpretation.    COORDINATION OF CARE: 9:14 AM-Discussed treatment plan which includes CBC, BMP, pain medication, and RX antibiotics with pt at bedside and pt agreed to plan. Advise pt to follow up with GI    I reviewed available ER/hospitalization records through the EMR   Labs Review Labs Reviewed - No data to display  Imaging Review No results found.   EKG Interpretation None      MDM   Final diagnoses:  Rectal pain   Patient with recurrent rectal pain.  Patient is attempting to get intake yesterday neurology at this time.  Patient be placed on anti-inflammatories and rectal cream.  A short course of Flagyl be given as the patient could have developing diverticulitis however I suspect this is much less likely.  Recent CT scan 2 weeks ago without pathology.  I personally performed the services described in this documentation, which was scribed in my presence. The recorded information has been reviewed and is accurate.        Azalia Bilis, MD 09/15/14 1110

## 2014-09-15 NOTE — ED Notes (Signed)
Patient complaining of rectal bleeding and pain x 3 days. States he was seen here 3 weeks ago for same.

## 2014-09-15 NOTE — Discharge Instructions (Signed)
Proctitis °Proctitis is the swelling and soreness (inflammation) of the lining of the rectum. The rectum is at the end of the large intestine and is attached to the anus. The inflammation causes pain and discomfort. It may be short-term (acute) or long-lasting (chronic). °CAUSES °Inflammation in the rectum can be caused by many things, such as: °· Sexually transmitted diseases (STDs). °· Infection. °· Anal-rectal trauma or injury. °· Ulcerative colitis or Crohn's disease. °· Radiation therapy directed near the rectum. °· Antibiotic therapy. °SYMPTOMS °· Sudden, uncomfortable, and frequent urge to have a bowel movement. °· Anal or rectal pain. °· Abdominal cramping or pain. °· Sensation that the rectum is full. °· Rectal bleeding. °· Pus or mucus discharge from anus. °· Diarrhea or frequent soft, loose stools. °DIAGNOSIS °Diagnosis may include the following: °· A history and physical exam. °· An STD test. °· Blood tests. °· Stool tests. °· Rectal culture. °· A procedure to evaluate the anal canal (anoscopy). °· Procedures to look at part, or the entire large bowel (sigmoidoscopy, colonoscopy). °TREATMENT °Treatment of proctitis depends on the cause. Reducing the symptoms of inflammation and eliminating infection are the main goals of treatment. Treatment may include: °· Home remedies and lifestyle, such as sitz baths and avoiding food right before bedtime. °· Topical ointments, foams, suppositories, or enemas, such as corticosteroids or anti-inflammatories. °· Antibiotic or antiviral medicines to treat infection or to control harmful bacteria. °· Medicines to control diarrhea, soften stools, and reduce pain. °· Medicines to suppress the immune system. °· Avoiding the activity that caused rectal trauma. °· Nutritional, dietary, or herbal supplements. °· Heat or laser therapy for persistent bleeding. °· A dilation procedure to enlarge a narrowed rectum. °· Surgery, though rare, may be necessary to repair damaged rectal  lining. °HOME CARE INSTRUCTIONS °Only take medicines that are recommended or approved by your caregiver. Do not take anti-diarrhea medicine without your caregiver's approval. °SEEK MEDICAL CARE IF: °· You often experience one or more of the symptoms noted above. °· You keep experiencing symptoms after treatment. °· You have questions or concerns about your symptoms or treatment plan. °MAKE SURE YOU: °· Understand these instructions. °· Will watch your condition. °· Will get help right away if you are not doing well or get worse. °FOR MORE INFORMATION °National Institute of Diabetes and Digestive and Kidney Disease (NIDDK): www.digestive.niddk.hih.gov/ °Document Released: 03/10/2011 Document Revised: 07/16/2012 Document Reviewed: 03/10/2011 °[employer]® Patient Information ©2015 [employer]. This information is not intended to replace advice given to you by your health care provider. Make sure you discuss any questions you have with your health care provider. ° °

## 2014-12-03 ENCOUNTER — Emergency Department (HOSPITAL_COMMUNITY)
Admission: EM | Admit: 2014-12-03 | Discharge: 2014-12-03 | Disposition: A | Discharge status: Home / Self Care | Payer: Self-pay | Attending: Emergency Medicine | Admitting: Emergency Medicine

## 2014-12-03 ENCOUNTER — Encounter (HOSPITAL_COMMUNITY): Payer: Self-pay | Admitting: *Deleted

## 2014-12-03 DIAGNOSIS — M10071 Idiopathic gout, right ankle and foot: Secondary | ICD-10-CM | POA: Insufficient documentation

## 2014-12-03 DIAGNOSIS — Z8719 Personal history of other diseases of the digestive system: Secondary | ICD-10-CM | POA: Insufficient documentation

## 2014-12-03 DIAGNOSIS — Z792 Long term (current) use of antibiotics: Secondary | ICD-10-CM | POA: Insufficient documentation

## 2014-12-03 DIAGNOSIS — M1000 Idiopathic gout, unspecified site: Secondary | ICD-10-CM

## 2014-12-03 DIAGNOSIS — Z7952 Long term (current) use of systemic steroids: Secondary | ICD-10-CM | POA: Insufficient documentation

## 2014-12-03 MED ORDER — ACETAMINOPHEN-CODEINE #3 300-30 MG PO TABS
2.0000 | ORAL_TABLET | Freq: Once | ORAL | Status: AC
  Administered 2014-12-03: 2 via ORAL
  Filled 2014-12-03: qty 2

## 2014-12-03 MED ORDER — ONDANSETRON HCL 4 MG PO TABS
4.0000 mg | ORAL_TABLET | Freq: Once | ORAL | Status: AC
  Administered 2014-12-03: 4 mg via ORAL
  Filled 2014-12-03: qty 1

## 2014-12-03 MED ORDER — COLCHICINE 0.6 MG PO TABS
0.6000 mg | ORAL_TABLET | Freq: Once | ORAL | Status: AC
  Administered 2014-12-03: 0.6 mg via ORAL
  Filled 2014-12-03: qty 1

## 2014-12-03 MED ORDER — DEXAMETHASONE 4 MG PO TABS: 4.0000 mg | ORAL_TABLET | Freq: Two times a day (BID) | ORAL | Status: DC

## 2014-12-03 MED ORDER — INDOMETHACIN 25 MG PO CAPS
25.0000 mg | ORAL_CAPSULE | Freq: Once | ORAL | Status: AC
  Administered 2014-12-03: 25 mg via ORAL
  Filled 2014-12-03: qty 1

## 2014-12-03 MED ORDER — ACETAMINOPHEN-CODEINE #3 300-30 MG PO TABS: 1.0000 | ORAL_TABLET | Freq: Four times a day (QID) | ORAL | Status: DC | PRN

## 2014-12-03 MED ORDER — INDOMETHACIN 25 MG PO CAPS: 25.0000 mg | ORAL_CAPSULE | Freq: Three times a day (TID) | ORAL | Status: DC | PRN

## 2014-12-03 NOTE — Discharge Instructions (Signed)
Please use Indocin and Decadron daily. Use Tylenol codeine for pain not relieved by these 2 medications. Please see the physicians at the [facility], or the [facility] until U Candice tablets a primary physician. Gout Gout is an inflammatory arthritis caused by a buildup of uric acid crystals in the joints. Uric acid is a chemical that is normally present in the blood. When the level of uric acid in the blood is too high it can form crystals that deposit in your joints and tissues. This causes joint redness, soreness, and swelling (inflammation). Repeat attacks are common. Over time, uric acid crystals can form into masses (tophi) near a joint, destroying bone and causing disfigurement. Gout is treatable and often preventable. CAUSES  The disease begins with elevated levels of uric acid in the blood. Uric acid is produced by your body when it breaks down a naturally found substance called purines. Certain foods you eat, such as meats and fish, contain high amounts of purines. Causes of an elevated uric acid level include:  Being passed down from parent to child (heredity).  Diseases that cause increased uric acid production (such as obesity, psoriasis, and certain cancers).  Excessive alcohol use.  Diet, especially diets rich in meat and seafood.  Medicines, including certain cancer-fighting medicines (chemotherapy), water pills (diuretics), and aspirin.  Chronic kidney disease. The kidneys are no longer able to remove uric acid well.  Problems with metabolism. Conditions strongly associated with gout include:  Obesity.  High blood pressure.  High cholesterol.  Diabetes. Not everyone with elevated uric acid levels gets gout. It is not understood why some people get gout and others do not. Surgery, joint injury, and eating too much of certain foods are some of the factors that can lead to gout attacks. SYMPTOMS   An attack of gout comes on quickly. It causes intense  pain with redness, swelling, and warmth in a joint.  Fever can occur.  Often, only one joint is involved. Certain joints are more commonly involved:  Base of the big toe.  Knee.  Ankle.  Wrist.  Finger. Without treatment, an attack usually goes away in a few days to weeks. Between attacks, you usually will not have symptoms, which is different from many other forms of arthritis. DIAGNOSIS  Your caregiver will suspect gout based on your symptoms and exam. In some cases, tests may be recommended. The tests may include:  Blood tests.  Urine tests.  X-rays.  Joint fluid exam. This exam requires a needle to remove fluid from the joint (arthrocentesis). Using a microscope, gout is confirmed when uric acid crystals are seen in the joint fluid. TREATMENT  There are two phases to gout treatment: treating the sudden onset (acute) attack and preventing attacks (prophylaxis).  Treatment of an Acute Attack.  Medicines are used. These include anti-inflammatory medicines or steroid medicines.  An injection of steroid medicine into the affected joint is sometimes necessary.  The painful joint is rested. Movement can worsen the arthritis.  You may use warm or cold treatments on painful joints, depending which works best for you.  Treatment to Prevent Attacks.  If you suffer from frequent gout attacks, your caregiver may advise preventive medicine. These medicines are started after the acute attack subsides. These medicines either help your kidneys eliminate uric acid from your body or decrease your uric acid production. You may need to stay on these medicines for a very long time.  The early phase of treatment with preventive medicine  can be associated with an increase in acute gout attacks. For this reason, during the first few months of treatment, your caregiver may also advise you to take medicines usually used for acute gout treatment. Be sure you understand your caregiver's directions.  Your caregiver may make several adjustments to your medicine dose before these medicines are effective.  Discuss dietary treatment with your caregiver or dietitian. Alcohol and drinks high in sugar and fructose and foods such as meat, poultry, and seafood can increase uric acid levels. Your caregiver or dietitian can advise you on drinks and foods that should be limited. HOME CARE INSTRUCTIONS   Do not take aspirin to relieve pain. This raises uric acid levels.  Only take over-the-counter or prescription medicines for pain, discomfort, or fever as directed by your caregiver.  Rest the joint as much as possible. When in bed, keep sheets and blankets off painful areas.  Keep the affected joint raised (elevated).  Apply warm or cold treatments to painful joints. Use of warm or cold treatments depends on which works best for you.  Use crutches if the painful joint is in your leg.  Drink enough fluids to keep your urine clear or pale yellow. This helps your body get rid of uric acid. Limit alcohol, sugary drinks, and fructose drinks.  Follow your dietary instructions. Pay careful attention to the amount of protein you eat. Your daily diet should emphasize fruits, vegetables, whole grains, and fat-free or low-fat milk products. Discuss the use of coffee, vitamin C, and cherries with your caregiver or dietitian. These may be helpful in lowering uric acid levels.  Maintain a healthy body weight. SEEK MEDICAL CARE IF:   You develop diarrhea, vomiting, or any side effects from medicines.  You do not feel better in 24 hours, or you are getting worse. SEEK IMMEDIATE MEDICAL CARE IF:   Your joint becomes suddenly more tender, and you have chills or a fever. MAKE SURE YOU:   Understand these instructions.  Will watch your condition.  Will get help right away if you are not doing well or get worse. Document Released: 03/18/2000 Document Revised: 08/05/2013 Document Reviewed:  11/02/2011 [facility] Patient Information 2015 [place], [place]. This information is not intended to replace advice given to you by your health care provider. Make sure you discuss any questions you have with your health care provider.

## 2014-12-03 NOTE — ED Provider Notes (Signed)
CSN: [id number]     Arrival date & time 12/03/14  0920 History   First MD Initiated Contact with Patient 12/03/14 1005     Chief Complaint  Patient presents with  . Gout     (Consider location/radiation/quality/duration/timing/severity/associated sxs/prior Treatment) HPI Comments: Patient presents to the emergency department with a complaint of pain of the right great toe. The patient states this problem started about 3 days ago. The pain is very similar to previous gout problems. The patient has a history of gout arthritis. There's been no injury or trauma to the toe. No recent operations or procedures involving the toe. The patient states that he does not have a physician, and is not on a constant medication for this gout problem. He presents now for assistance.  The history is provided by the patient.    Past Medical History  Diagnosis Date  . Gout   . Diverticula of colon    History reviewed. No pertinent past surgical history. No family history on file. Social History  Substance Use Topics  . Smoking status: Never Smoker   . Smokeless tobacco: None  . Alcohol Use: No    Review of Systems  Musculoskeletal: Positive for arthralgias.  All other systems reviewed and are negative.     Allergies  Review of patient's allergies indicates no known allergies.  Home Medications   Prior to Admission medications   Medication Sig Start Date End Date Taking? Authorizing Provider  acetaminophen-codeine (TYLENOL #3) 300-30 MG per tablet Take 1-2 tablets by mouth every 6 (six) hours as needed for moderate pain. 12/03/14   Ivery Quale, PA-C  allopurinol (ZYLOPRIM) 100 MG tablet Take 1.5 tablets (150 mg total) by mouth daily. Patient not taking: Reported on 07/21/2014 11/22/13   Junious Silk, PA-C  colchicine 0.6 MG tablet Take 1 tablet (0.6 mg total) by mouth 2 (two) times daily. Patient not taking: Reported on 07/21/2014 01/02/14   Ivery Quale, PA-C  cyclobenzaprine (FLEXERIL) 10  MG tablet Take 1 tablet (10 mg total) by mouth 2 (two) times daily as needed for muscle spasms. Patient not taking: Reported on 09/02/2014 07/21/14   Janne Napoleon, NP  dexamethasone (DECADRON) 4 MG tablet Take 1 tablet (4 mg total) by mouth 2 (two) times daily with a meal. 12/03/14   Ivery Quale, PA-C  hydrocortisone (ANUSOL-HC) 2.5 % rectal cream Apply rectally 2 times daily 09/15/14   Azalia Bilis, MD  ibuprofen (ADVIL,MOTRIN) 600 MG tablet Take 1 tablet (600 mg total) by mouth every 8 (eight) hours as needed. 09/15/14   Azalia Bilis, MD  indomethacin (INDOCIN) 25 MG capsule Take 1 capsule (25 mg total) by mouth 3 (three) times daily as needed. 12/03/14   Ivery Quale, PA-C  metroNIDAZOLE (FLAGYL) 500 MG tablet Take 1 tablet (500 mg total) by mouth 2 (two) times daily. 09/15/14   Azalia Bilis, MD  traZODone (DESYREL) 150 MG tablet Take 150 mg by mouth at bedtime.    Historical Provider, MD   BP 142/81 mmHg  Pulse 107  Temp(Src) 98.9 F (37.2 C) (Oral)  Resp 16  Ht  (1.676 m)  Wt 280 lb (127.007 kg)  BMI 45.21 kg/m2  SpO2 98% Physical Exam  Constitutional: He is oriented to person, place, and time. He appears well-developed and well-nourished.  Non-toxic appearance.  HENT:  Head: Normocephalic.  Right Ear: Tympanic membrane and external ear normal.  Left Ear: Tympanic membrane and external ear normal.  Eyes: EOM and lids are normal. Pupils are  equal, round, and reactive to light.  Neck: Normal range of motion. Neck supple. Carotid bruit is not present.  Cardiovascular: Normal rate, regular rhythm, normal heart sounds, intact distal pulses and normal pulses.   Pulmonary/Chest: Breath sounds normal. No respiratory distress.  Abdominal: Soft. Bowel sounds are normal. There is no tenderness. There is no guarding.  Musculoskeletal: Normal range of motion.  There is pain and moderate redness of the DIP of the right first toe. No significant swelling. No red streaks going up the foot. There  no lesions of the plantar surface. The metatarsal heads align according to palpation. The dorsalis pedis pulse and posterior tibial pulse are 2+. There is no increased swelling or increased redness of the right ankle or knee.  Lymphadenopathy:       Head (right side): No submandibular adenopathy present.       Head (left side): No submandibular adenopathy present.    He has no cervical adenopathy.  Neurological: He is alert and oriented to person, place, and time. He has normal strength. No cranial nerve deficit or sensory deficit.  Skin: Skin is warm and dry.  Psychiatric: He has a normal mood and affect. His speech is normal.  Nursing note and vitals reviewed.   ED Course  Procedures (including critical care time) Labs Review Labs Reviewed - No data to display  Imaging Review No results found. I have personally reviewed and evaluated these images and lab results as part of my medical decision-making.   EKG Interpretation None      MDM  Vital signs reviewed. The patient states this pain is similar to previous gout attacks. No evidence of any hot or septic joints appreciated. No history of any injury or trauma.  The patient is treated with Decadron, Tylenol codeine, and Indocin. The patient is given the phone number and address for resources for [facility], as well as the [facility] clinic to assist him until he can establish a primary physician.    Final diagnoses:  Idiopathic gout, unspecified chronicity, unspecified site    **I have reviewed nursing notes, vital signs, and all appropriate lab and imaging results for this patient.Ivery Quale, PA-C 12/03/14 1058  Geoffery Lyons, MD 12/03/14 1415

## 2014-12-03 NOTE — ED Notes (Signed)
Pt states gout pain to right great toe began 3 days ago.

## 2015-01-20 ENCOUNTER — Ambulatory Visit: Payer: Self-pay | Admitting: Physician Assistant

## 2015-01-20 VITALS — BP 150/80 | HR 79 | Temp 98.2°F | Ht 66.75 in | Wt 295.6 lb

## 2015-01-20 DIAGNOSIS — E1165 Type 2 diabetes mellitus with hyperglycemia: Secondary | ICD-10-CM

## 2015-01-20 DIAGNOSIS — E785 Hyperlipidemia, unspecified: Secondary | ICD-10-CM

## 2015-01-20 DIAGNOSIS — E782 Mixed hyperlipidemia: Secondary | ICD-10-CM | POA: Insufficient documentation

## 2015-01-20 DIAGNOSIS — IMO0002 Reserved for concepts with insufficient information to code with codable children: Secondary | ICD-10-CM | POA: Insufficient documentation

## 2015-01-20 DIAGNOSIS — I10 Essential (primary) hypertension: Secondary | ICD-10-CM

## 2015-01-20 MED ORDER — METOPROLOL TARTRATE 50 MG PO TABS: 50.0000 mg | ORAL_TABLET | Freq: Two times a day (BID) | ORAL | Status: DC

## 2015-01-20 NOTE — Progress Notes (Signed)
BP 150/80 mmHg  Pulse 79  Temp(Src) 98.2 F (36.8 C)  Ht 5' 6.75" (1.695 m)  Wt 295 lb 9.6 oz (134.083 kg)  BMI 46.67 kg/m2  SpO2 96%   Subjective:    Patient ID: Mark Acosta, male    DOB: 13-Jan-1971, 44 y.o.   MRN: [id number]  HPI: Mark Acosta is a 44 y.o. male presenting on 01/20/2015 for Diabetes   HPI  Chief Complaint  Patient presents with  . Diabetes  . Hypertension     Pt has been trying to turn in his cone disc application but says he is having problems getting in touch with [name].  He needs to go to GI for + iFOBT and c/o BRBPR   Relevant past medical, surgical, family and social history reviewed and updated as indicated. Interim medical history since our last visit reviewed. Allergies and medications reviewed and updated.   Current outpatient prescriptions:  .  ibuprofen (ADVIL,MOTRIN) 600 MG tablet, Take 1 tablet (600 mg total) by mouth every 8 (eight) hours as needed., Disp: 15 tablet, Rfl: 0 .  lisinopril (PRINIVIL,ZESTRIL) 20 MG tablet, Take 20 mg by mouth daily., Disp: , Rfl:  .  metFORMIN (GLUCOPHAGE) 500 MG tablet, Take 500 mg by mouth daily., Disp: , Rfl:  .  Omega-3 Fatty Acids (FISH OIL PO), Take 1 tablet by mouth 2 (two) times daily., Disp: , Rfl:  .  rosuvastatin (CRESTOR) 20 MG tablet, Take 20 mg by mouth daily., Disp: , Rfl:  .  traZODone (DESYREL) 150 MG tablet, Take 150 mg by mouth at bedtime., Disp: , Rfl:  .  metoprolol (LOPRESSOR) 50 MG tablet, Take 1 tablet (50 mg total) by mouth 2 (two) times daily., Disp: 180 tablet, Rfl: 1   Review of Systems  Constitutional: Positive for appetite change. Negative for fever, chills, diaphoresis, fatigue and unexpected weight change.  HENT: Negative for congestion, dental problem, drooling, ear pain, facial swelling, hearing loss, mouth sores, sneezing, sore throat, trouble swallowing and voice change.   Eyes: Negative for pain, discharge, redness, itching and visual disturbance.  Respiratory:  Negative for cough, choking, shortness of breath and wheezing.   Cardiovascular: Negative for chest pain, palpitations and leg swelling.  Gastrointestinal: Positive for blood in stool. Negative for vomiting, abdominal pain, diarrhea and constipation.  Endocrine: Negative for cold intolerance, heat intolerance and polydipsia.  Genitourinary: Negative for dysuria, hematuria and decreased urine volume.  Musculoskeletal: Positive for arthralgias. Negative for back pain and gait problem.  Skin: Negative for rash.  Allergic/Immunologic: Negative for environmental allergies.  Neurological: Negative for seizures, syncope, light-headedness and headaches.  Hematological: Negative for adenopathy.  Psychiatric/Behavioral: Negative for suicidal ideas, dysphoric mood and agitation. The patient is not nervous/anxious.     Per HPI unless specifically indicated above     Objective:    BP 150/80 mmHg  Pulse 79  Temp(Src) 98.2 F (36.8 C)  Ht 5' 6.75" (1.695 m)  Wt 295 lb 9.6 oz (134.083 kg)  BMI 46.67 kg/m2  SpO2 96%  Wt Readings from Last 3 Encounters:  01/20/15 295 lb 9.6 oz (134.083 kg)  12/03/14 280 lb (127.007 kg)  09/15/14 270 lb (122.471 kg)    Physical Exam  Constitutional: He is oriented to person, place, and time. He appears well-developed and well-nourished.  HENT:  Head: Normocephalic and atraumatic.  Neck: Neck supple.  Cardiovascular: Normal rate and regular rhythm.   Pulmonary/Chest: Effort normal and breath sounds normal. He has no wheezes.  Abdominal: Soft. Bowel  sounds are normal. There is no tenderness.  Musculoskeletal: He exhibits no edema.  Lymphadenopathy:    He has no cervical adenopathy.  Neurological: He is alert and oriented to person, place, and time.  Skin: Skin is warm and dry.  Psychiatric: He has a normal mood and affect. His behavior is normal.  Vitals reviewed.       Assessment & Plan:    Encounter Diagnoses  Name Primary?  Marland Kitchen Uncontrolled type 2  diabetes mellitus with hyperglycemia, unspecified long term insulin use status (HCC) Yes  . Essential hypertension, benign   . Hyperlipemia   . Morbid obesity, unspecified obesity type (HCC)       -bp up today.  Add metoprolol. -urged pt to get his cone discount application turned in so he can get in with GI for his BRBPR and + iFOBT -pt to get fasting labs drawn -pt needs to attend DM educ class. Reviewed with him this is mandatory for all diabetic pts at [facility] -pt on DM eye exam list -f/u 6 wk to recheck bp

## 2015-01-21 LAB — HEMOGLOBIN A1C
Hgb A1c MFr Bld: 7 % — ABNORMAL HIGH (ref <5.7)
MEAN PLASMA GLUCOSE: 154 mg/dL — AB (ref <117)

## 2015-01-21 LAB — COMPLETE METABOLIC PANEL WITH GFR
ALK PHOS: 82 U/L (ref 40–115)
ALT: 52 U/L — ABNORMAL HIGH (ref 9–46)
AST: 28 U/L (ref 10–40)
Albumin: 4.5 g/dL (ref 3.6–5.1)
BUN: 15 mg/dL (ref 7–25)
CO2: 24 mmol/L (ref 20–31)
Calcium: 8.9 mg/dL (ref 8.6–10.3)
Chloride: 104 mmol/L (ref 98–110)
Creat: 0.85 mg/dL (ref 0.60–1.35)
GFR, Est African American: >89 mL/min (ref >=60)
GFR, Est Non African American: >89 mL/min (ref >=60)
GLUCOSE: 153 mg/dL — AB (ref 65–99)
POTASSIUM: 4.4 mmol/L (ref 3.5–5.3)
SODIUM: 137 mmol/L (ref 135–146)
Total Bilirubin: 0.6 mg/dL (ref 0.2–1.2)
Total Protein: 7 g/dL (ref 6.1–8.1)

## 2015-01-21 LAB — LIPID PANEL
CHOL/HDL RATIO: 3.6 ratio (ref <=5.0)
Cholesterol: 122 mg/dL — ABNORMAL LOW (ref 125–200)
HDL: 34 mg/dL — AB (ref >=40)
LDL Cholesterol: 51 mg/dL (ref <130)
Triglycerides: 187 mg/dL — ABNORMAL HIGH (ref <150)
VLDL: 37 mg/dL — AB (ref <30)

## 2015-01-24 ENCOUNTER — Encounter: Payer: Self-pay | Admitting: Physician Assistant

## 2015-02-17 ENCOUNTER — Other Ambulatory Visit: Payer: Self-pay | Admitting: Physician Assistant

## 2015-02-17 MED ORDER — LOVASTATIN 20 MG PO TABS: 20.0000 mg | ORAL_TABLET | Freq: Every day | ORAL | Status: DC

## 2015-03-02 ENCOUNTER — Ambulatory Visit: Payer: Self-pay | Admitting: Physician Assistant

## 2015-03-03 ENCOUNTER — Other Ambulatory Visit: Payer: Self-pay | Admitting: Physician Assistant

## 2015-03-03 DIAGNOSIS — R195 Other fecal abnormalities: Secondary | ICD-10-CM

## 2015-03-18 ENCOUNTER — Ambulatory Visit: Payer: Self-pay | Admitting: Physician Assistant

## 2015-03-18 VITALS — BP 124/82 | HR 73 | Temp 98.8°F | Wt 290.0 lb

## 2015-03-18 DIAGNOSIS — E785 Hyperlipidemia, unspecified: Secondary | ICD-10-CM

## 2015-03-18 DIAGNOSIS — E1165 Type 2 diabetes mellitus with hyperglycemia: Secondary | ICD-10-CM

## 2015-03-18 DIAGNOSIS — I10 Essential (primary) hypertension: Secondary | ICD-10-CM

## 2015-03-18 DIAGNOSIS — R195 Other fecal abnormalities: Secondary | ICD-10-CM

## 2015-03-18 MED ORDER — METFORMIN HCL 1000 MG PO TABS: 1000.0000 mg | ORAL_TABLET | Freq: Two times a day (BID) | ORAL | Status: DC

## 2015-03-18 NOTE — Progress Notes (Signed)
There were no vitals taken for this visit.   Subjective:    Patient ID: Mark Acosta, male    DOB: Jan 02, 1971, 44 y.o.   MRN: [id number]  HPI: Mark KluverCurtis Hirschmann is a 44 y.o. male presenting on 03/18/2015 for Hypertension   HPI   Pt is feeling well this morning. He says he will be moving out of [place] house in 2 weeks  Pt has still not turned in his cone discount application.   Relevant past medical, surgical, family and social history reviewed and updated as indicated. Interim medical history since our last visit reviewed. Allergies and medications reviewed and updated.  Current outpatient prescriptions:  .  ibuprofen (ADVIL,MOTRIN) 600 MG tablet, Take 1 tablet (600 mg total) by mouth every 8 (eight) hours as needed., Disp: 15 tablet, Rfl: 0 .  lisinopril (PRINIVIL,ZESTRIL) 20 MG tablet, Take 20 mg by mouth daily., Disp: , Rfl:  .  lovastatin (MEVACOR) 20 MG tablet, Take 1 tablet (20 mg total) by mouth at bedtime., Disp: 30 tablet, Rfl: 3 .  metFORMIN (GLUCOPHAGE) 500 MG tablet, Take 500 mg by mouth daily., Disp: , Rfl:  .  metoprolol (LOPRESSOR) 50 MG tablet, Take 1 tablet (50 mg total) by mouth 2 (two) times daily., Disp: 180 tablet, Rfl: 1 .  Omega-3 Fatty Acids (FISH OIL PO), Take 1 tablet by mouth 2 (two) times daily., Disp: , Rfl:  .  traZODone (DESYREL) 150 MG tablet, Take 150 mg by mouth at bedtime., Disp: , Rfl:    Review of Systems  Constitutional: Negative for fever, chills, diaphoresis, appetite change, fatigue and unexpected weight change.  HENT: Negative for congestion, dental problem, drooling, ear pain, facial swelling, hearing loss, mouth sores, sneezing, sore throat, trouble swallowing and voice change.   Eyes: Negative for pain, discharge, redness, itching and visual disturbance.  Respiratory: Negative for cough, choking, shortness of breath and wheezing.   Cardiovascular: Negative for chest pain, palpitations and leg swelling.  Gastrointestinal: Negative for  vomiting, abdominal pain, diarrhea, constipation and blood in stool.  Endocrine: Negative for cold intolerance, heat intolerance and polydipsia.  Genitourinary: Negative for dysuria, hematuria and decreased urine volume.  Musculoskeletal: Positive for arthralgias. Negative for back pain and gait problem.  Skin: Negative for rash.  Allergic/Immunologic: Negative for environmental allergies.  Neurological: Negative for seizures, syncope, light-headedness and headaches.  Hematological: Negative for adenopathy.  Psychiatric/Behavioral: Negative for suicidal ideas, dysphoric mood and agitation. The patient is not nervous/anxious.     Per HPI unless specifically indicated above     Objective:     Blood pressure 124/82, pulse 73, temperature 98.8 F (37.1 C), weight 290 lb (131.543 kg), SpO2 95 %.   There were no vitals taken for this visit.  Wt Readings from Last 3 Encounters:  01/20/15 295 lb 9.6 oz (134.083 kg)  12/03/14 280 lb (127.007 kg)  09/15/14 270 lb (122.471 kg)    Physical Exam  Constitutional: He is oriented to person, place, and time. He appears well-developed and well-nourished.  HENT:  Head: Normocephalic and atraumatic.  Neck: Neck supple.  Cardiovascular: Normal rate and regular rhythm.   Pulmonary/Chest: Effort normal and breath sounds normal. He has no wheezes.  Abdominal: Soft. Bowel sounds are normal. There is no tenderness.  Musculoskeletal: He exhibits no edema.  Lymphadenopathy:    He has no cervical adenopathy.  Neurological: He is alert and oriented to person, place, and time.  Skin: Skin is warm and dry.  Psychiatric: He has a normal mood  and affect. His behavior is normal.  Vitals reviewed.   Results for orders placed or performed in visit on 01/20/15  HgB A1c  Result Value Ref Range   Hgb A1c MFr Bld 7.0 (H) <5.7 %   Mean Plasma Glucose 154 (H) <117 mg/dL  Lipid Profile  Result Value Ref Range   Cholesterol 122 (L) 125 - 200 mg/dL    Triglycerides 213 (H) <150 mg/dL   HDL 34 (L) >=08 mg/dL   Total CHOL/HDL Ratio 3.6 <=5.0 Ratio   VLDL 37 (H) <30 mg/dL   LDL Cholesterol 51 <657 mg/dL  COMPLETE METABOLIC PANEL WITH GFR  Result Value Ref Range   Sodium 137 135 - 146 mmol/L   Potassium 4.4 3.5 - 5.3 mmol/L   Chloride 104 98 - 110 mmol/L   CO2 24 20 - 31 mmol/L   Glucose, Bld 153 (H) 65 - 99 mg/dL   BUN 15 7 - 25 mg/dL   Creat 8.46 9.62 - 9.52 mg/dL   Total Bilirubin 0.6 0.2 - 1.2 mg/dL   Alkaline Phosphatase 82 40 - 115 U/L   AST 28 10 - 40 U/L   ALT 52 (H) 9 - 46 U/L   Total Protein 7.0 6.1 - 8.1 g/dL   Albumin 4.5 3.6 - 5.1 g/dL   Calcium 8.9 8.6 - 84.1 mg/dL   GFR, Est African American >89 >=60 mL/min   GFR, Est Non African American >89 >=60 mL/min      Assessment & Plan:   Encounter Diagnoses  Name Primary?  . Essential hypertension, benign Yes  . Uncontrolled type 2 diabetes mellitus with hyperglycemia, unspecified long term insulin use status (HCC)   . Hyperlipemia   . Morbid obesity, unspecified obesity type (HCC)     -Reviewed labs with pt -bp improved. cont metoprolol. -Increase metformin -urged pt to get his cone discount application turned in so he can get in with GI for his BRBPR and + iFOBT -pt needs to attend DM educ class. Reviewed with him this is mandatory for all diabetic pts at [facility] -pt has DM eye exam appt in january -f/u 3 mo. rto sooner prn

## 2015-03-18 NOTE — Patient Instructions (Signed)
TURN IN YOUR [employer] DISCOUNT APPLICATION

## 2015-04-27 ENCOUNTER — Encounter: Payer: Self-pay | Admitting: Internal Medicine

## 2015-05-11 ENCOUNTER — Ambulatory Visit (INDEPENDENT_AMBULATORY_CARE_PROVIDER_SITE_OTHER): Payer: Self-pay | Admitting: Nurse Practitioner

## 2015-05-11 ENCOUNTER — Encounter: Payer: Self-pay | Admitting: Nurse Practitioner

## 2015-05-11 ENCOUNTER — Other Ambulatory Visit: Payer: Self-pay

## 2015-05-11 VITALS — BP 172/99 | HR 83 | Temp 98.6°F | Ht 66.0 in | Wt 278.6 lb

## 2015-05-11 DIAGNOSIS — R195 Other fecal abnormalities: Secondary | ICD-10-CM | POA: Insufficient documentation

## 2015-05-11 DIAGNOSIS — K625 Hemorrhage of anus and rectum: Secondary | ICD-10-CM | POA: Insufficient documentation

## 2015-05-11 NOTE — Assessment & Plan Note (Signed)
Heme positive stool per PCP note. Initially evaluated for blood in stool in the emergency room middle of last year. States he has had hematochezia for number of years which is just ignored. Also complains of occasional dark maroon or black stools. Given his presentation we'll plan for colonoscopy with possible upper endoscopy as noted above. Return for follow-up in 2 months.

## 2015-05-11 NOTE — Assessment & Plan Note (Addendum)
Patient with a history of rectal bleeding for years which he has ignored. Notes stools with typically bright red blood although occasionally dark maroon and occasionally dark black. Also toilet tissue hematochezia. Intermittent abdominal pain which is improved as of the past month. Does have constipation. Bleeding likely due to hemorrhoids or other benign anorectal source although cannot rule out more insidious process. At this point we'll proceed with a colonoscopy and possible endoscopy to further evaluate. Informed him to call us for Anusol cream or suppositories if needed.  Proceed with TCS +/- EGD  with Dr. Jena Gauss in the OR on propofol/MAC in near future: the risks, benefits, and alternatives have been discussed with the patient in detail. The patient states understanding and desires to proceed.  The patient is on trazodone, occasional/rare Percocet. He does have a history of marijuana and cocaine use although he is not using the past year. Occasional beer. Given polysubstance abuse history we'll plan for the procedure and the OR on propofol/MAC to ensure adequate sedation.

## 2015-05-11 NOTE — Progress Notes (Addendum)
Primary Care Physician:  Jacquelin Hawking, PA-C Primary Gastroenterologist:  Dr. Jena Gauss  Chief Complaint  Patient presents with  . Blood In Stools    HPI:   45 year old male presents on referral from PCP for brbpr and heme+ stool. PCP notes reviewed. Heme positive stool first found in June 2016 in the emergency room when he presented with complaints of rectal bleeding. He has history of diverticula and when he was seen in the emergency room complaining of 3 days before seeing rectal bleeding which was initially just on the toilet tissue and then dark stools afterward. Also admitted blood in stool on baseline. He had a single positive iFobT. He is seen his primary care provider in October and December 2016 at which point he was urged to see GI. No history of endoscopy or colonoscopy in our system.  Today he states he has been having rectal bleeding for years, but "just put it on the back burner." Typically bright red and occasionally dark maroon and occasionally dark black. Also with some toilet tissue hematochezia. Has occasional abdominal pain but not much in the past month or so. Occasional morning nausea, denies vomiting. Denies fever, chills, unintentional weight loss. Has never had a colonoscopy or endoscopy. Has a bowel movement 4-5 times a day until recently, currently 2-3 times a day which changed about a month ago when starting percocet. First stool or two is harder then softens up as he goes about his day. After a couple bowel movements has rectal pain. Pain is throbbing. Denies chest pain, dyspnea, dizziness, lightheadedness, syncope, near syncope. Denies any other upper or lower GI symptoms.  His BP is elevated this morning but states he hasn't taken his medications yet. Advised him to take them as soon as he can.  Past Medical History  Diagnosis Date  . Gout   . Diverticula of colon   . Substance abuse     hx etoh, marijuana, cocaine. quit 02/2014    No past surgical history  on file.  Current Outpatient Prescriptions  Medication Sig Dispense Refill  . lisinopril (PRINIVIL,ZESTRIL) 20 MG tablet Take 20 mg by mouth daily.    Marland Kitchen lovastatin (MEVACOR) 20 MG tablet Take 1 tablet (20 mg total) by mouth at bedtime. 30 tablet 3  . metFORMIN (GLUCOPHAGE) 1000 MG tablet Take 1 tablet (1,000 mg total) by mouth 2 (two) times daily with a meal. 180 tablet 1  . metoprolol (LOPRESSOR) 50 MG tablet Take 1 tablet (50 mg total) by mouth 2 (two) times daily. 180 tablet 1  . Omega-3 Fatty Acids (FISH OIL PO) Take 1 tablet by mouth 2 (two) times daily.    . traZODone (DESYREL) 150 MG tablet Take 150 mg by mouth at bedtime.    Marland Kitchen ibuprofen (ADVIL,MOTRIN) 600 MG tablet Take 1 tablet (600 mg total) by mouth every 8 (eight) hours as needed. (Patient not taking: Reported on 05/11/2015) 15 tablet 0   No current facility-administered medications for this visit.    Allergies as of 05/11/2015  . (No Known Allergies)    No family history on file.  Social History   Social History  . Marital Status: Single    Spouse Name: N/A  . Number of Children: N/A  . Years of Education: N/A   Occupational History  . Not on file.   Social History Main Topics  . Smoking status: Never Smoker   . Smokeless tobacco: Not on file  . Alcohol Use: No  Comment: hx alcoholism.   . Drug Use: No     Comment: hx addiction- marij and cocaine  . Sexual Activity: Not on file   Other Topics Concern  . Not on file   Social History Narrative    Review of Systems: General: Negative for anorexia, weight loss, fever, chills, fatigue, weakness. Eyes: Negative for vision changes.  ENT: Negative for hoarseness, difficulty swallowing. CV: Negative for chest pain, angina, palpitations, peripheral edema.  Respiratory: Negative for dyspnea at rest, cough, sputum, wheezing.  GI: See history of present illness. MS: Admits chronic back pain.  Derm: Negative for rash or itching.  Endo: Negative for unusual  weight change.  Heme: Negative for bruising or bleeding.    Physical Exam: BP 172/99 mmHg  Pulse 83  Temp(Src) 98.6 F (37 C) (Oral)  Ht  (1.676 m)  Wt 278 lb 9.6 oz (126.372 kg)  BMI 44.99 kg/m2 General:   Morbidly obese male, alert and oriented. Pleasant and cooperative. Well-nourished and well-developed.  Head:  Normocephalic and atraumatic. Eyes:  Without icterus, sclera clear and conjunctiva pink.  Ears:  Normal auditory acuity. Cardiovascular:  S1, S2 present without murmurs appreciated. Extremities without clubbing or edema. Respiratory:  Clear to auscultation bilaterally. No wheezes, rales, or rhonchi. No distress.  Gastrointestinal:  +BS, obese but soft, non-tender and non-distended. No HSM noted. No guarding or rebound. No masses appreciated.  Rectal:  Deferred  Musculoskalatal:  Symmetrical without gross deformities. Skin:  Intact without significant lesions or rashes. Neurologic:  Alert and oriented x4;  grossly normal neurologically. Psych:  Alert and cooperative. Normal mood and affect. Heme/Lymph/Immune: No excessive bruising noted.    05/11/2015 8:50 AM

## 2015-05-11 NOTE — Patient Instructions (Signed)
1. We will schedule your procedure for you. 2. Call us if you need any cream or suppositories to help her symptoms. 3. Take her blood pressure medications when he got home. 4. Return for follow-up in 2 months.

## 2015-05-11 NOTE — Progress Notes (Signed)
cc'ed to pcp °

## 2015-05-18 ENCOUNTER — Other Ambulatory Visit: Payer: Self-pay | Admitting: Physician Assistant

## 2015-06-01 NOTE — Patient Instructions (Addendum)
20    Your procedure is scheduled on: 06/04/2015  [facility] to [facility] at 7:15    AM.  Call this number if you have problems the morning of surgery:[phone]   Remember:   Do not drink or eat food:After Midnight.      Take these medicines the morning of surgery with A SIP OF WATER: Lisinopril and Metoprolol   Do not wear jewelry, make-up or nail polish.  Do not wear lotions, powders, or perfumes. You may wear deodorant.  Do not shave 48 hours prior to surgery. Men may shave face and neck.  Do not bring valuables to the hospital.  Contacts, dentures or bridgework may not be worn into surgery.  Leave suitcase in the car. After surgery it may be brought to your room.  For patients admitted to the hospital, checkout time is 11:00 AM the day of discharge.   Patients discharged the day of surgery will not be allowed to drive home.  Name and phone number of your driver:    Please read over the following fact sheets that you were given: Pain Booklet, Lab Information and Anesthesia Post-op Instructions   Endoscopy Care After Please read the instructions outlined below and refer to this sheet in the next few weeks. These discharge instructions provide you with general information on caring for yourself after you leave the hospital. Your doctor may also give you specific instructions. While your treatment has been planned according to the most current medical practices available, unavoidable complications occasionally occur. If you have any problems or questions after discharge, please call your doctor. HOME CARE INSTRUCTIONS Activity  You may resume your regular activity but move at a slower pace for the next 24 hours.   Take frequent rest periods for the next 24 hours.   Walking will help expel (get rid of) the air and reduce the bloated feeling in your abdomen.   No driving for 24 hours (because of the anesthesia (medicine) used during the test).   You may shower.   Do not sign  any important legal documents or operate any machinery for 24 hours (because of the anesthesia used during the test).  Nutrition  Drink plenty of fluids.   You may resume your normal diet.   Begin with a light meal and progress to your normal diet.   Avoid alcoholic beverages for 24 hours or as instructed by your caregiver.  Medications You may resume your normal medications unless your caregiver tells you otherwise. What you can expect today  You may experience abdominal discomfort such as a feeling of fullness or "gas" pains.   You may experience a sore throat for 2 to 3 days. This is normal. Gargling with salt water may help this.  Follow-up Your doctor will discuss the results of your test with you. SEEK IMMEDIATE MEDICAL CARE IF:  You have excessive nausea (feeling sick to your stomach) and/or vomiting.   You have severe abdominal pain and distention (swelling).   You have trouble swallowing.   You have a temperature over 100 F (37.8 C).   You have rectal bleeding or vomiting of blood.  Document Released: 11/03/2003 Document Revised: 03/10/2011 Document Reviewed: 05/16/2007 Colonoscopy, Care After Refer to this sheet in the next few weeks. These instructions provide you with information on caring for yourself after your procedure. Your health care provider may also give you more specific instructions. Your treatment has been planned according to current medical practices, but problems sometimes occur. Call  your health care provider if you have any problems or questions after your procedure. WHAT TO EXPECT AFTER THE PROCEDURE  After your procedure, it is typical to have the following:  A small amount of blood in your stool.  Moderate amounts of gas and mild abdominal cramping or bloating. HOME CARE INSTRUCTIONS  Do not drive, operate machinery, or sign important documents for 24 hours.  You may shower and resume your regular physical activities, but move at a slower  pace for the first 24 hours.  Take frequent rest periods for the first 24 hours.  Walk around or put a warm pack on your abdomen to help reduce abdominal cramping and bloating.  Drink enough fluids to keep your urine clear or pale yellow.  You may resume your normal diet as instructed by your health care provider. Avoid heavy or fried foods that are hard to digest.  Avoid drinking alcohol for 24 hours or as instructed by your health care provider.  Only take over-the-counter or prescription medicines as directed by your health care provider.  If a tissue sample (biopsy) was taken during your procedure:  Do not take aspirin or blood thinners for 7 days, or as instructed by your health care provider.  Do not drink alcohol for 7 days, or as instructed by your health care provider.  Eat soft foods for the first 24 hours. SEEK MEDICAL CARE IF: You have persistent spotting of blood in your stool 2-3 days after the procedure. SEEK IMMEDIATE MEDICAL CARE IF:  You have more than a small spotting of blood in your stool.  You pass large blood clots in your stool.  Your abdomen is swollen (distended).  You have nausea or vomiting.  You have a fever.  You have increasing abdominal pain that is not relieved with medicine.   This information is not intended to replace advice given to you by your health care provider. Make sure you discuss any questions you have with your health care provider.   Document Released: 11/03/2003 Document Revised: 01/09/2013 Document Reviewed: 11/26/2012 Elsevier Interactive Patient Education [employer]

## 2015-06-02 ENCOUNTER — Encounter (HOSPITAL_COMMUNITY): Payer: Self-pay

## 2015-06-02 ENCOUNTER — Encounter (HOSPITAL_COMMUNITY)
Admission: RE | Admit: 2015-06-02 | Discharge: 2015-06-02 | Disposition: A | Discharge status: Home / Self Care | Payer: Self-pay | Source: Ambulatory Visit | Attending: Internal Medicine | Admitting: Internal Medicine

## 2015-06-02 DIAGNOSIS — Z01812 Encounter for preprocedural laboratory examination: Secondary | ICD-10-CM | POA: Insufficient documentation

## 2015-06-02 DIAGNOSIS — I10 Essential (primary) hypertension: Secondary | ICD-10-CM | POA: Insufficient documentation

## 2015-06-02 DIAGNOSIS — Z01810 Encounter for preprocedural cardiovascular examination: Secondary | ICD-10-CM | POA: Insufficient documentation

## 2015-06-02 HISTORY — DX: Type 2 diabetes mellitus without complications: E11.9

## 2015-06-02 LAB — BASIC METABOLIC PANEL
ANION GAP: 9 (ref 5–15)
BUN: 19 mg/dL (ref 6–20)
CALCIUM: 8.5 mg/dL — AB (ref 8.9–10.3)
CO2: 24 mmol/L (ref 22–32)
Chloride: 106 mmol/L (ref 101–111)
Creatinine, Ser: 0.89 mg/dL (ref 0.61–1.24)
GFR calc Af Amer: >60 mL/min (ref >60)
Glucose, Bld: 133 mg/dL — ABNORMAL HIGH (ref 65–99)
POTASSIUM: 4.3 mmol/L (ref 3.5–5.1)
Sodium: 139 mmol/L (ref 135–145)

## 2015-06-02 LAB — CBC
HEMATOCRIT: 40.2 % (ref 39.0–52.0)
Hemoglobin: 13.6 g/dL (ref 13.0–17.0)
MCH: 29.6 pg (ref 26.0–34.0)
MCHC: 33.8 g/dL (ref 30.0–36.0)
MCV: 87.6 fL (ref 78.0–100.0)
Platelets: 286 10*3/uL (ref 150–400)
RBC: 4.59 MIL/uL (ref 4.22–5.81)
RDW: 12.8 % (ref 11.5–15.5)
WBC: 8 10*3/uL (ref 4.0–10.5)

## 2015-06-03 ENCOUNTER — Encounter (HOSPITAL_COMMUNITY): Payer: Self-pay

## 2015-06-03 ENCOUNTER — Emergency Department (HOSPITAL_COMMUNITY): Payer: Self-pay

## 2015-06-03 ENCOUNTER — Emergency Department (HOSPITAL_COMMUNITY)
Admission: EM | Admit: 2015-06-03 | Discharge: 2015-06-03 | Disposition: A | Discharge status: Home / Self Care | Payer: Self-pay | Attending: Emergency Medicine | Admitting: Emergency Medicine

## 2015-06-03 DIAGNOSIS — K921 Melena: Secondary | ICD-10-CM

## 2015-06-03 DIAGNOSIS — R300 Dysuria: Secondary | ICD-10-CM | POA: Insufficient documentation

## 2015-06-03 DIAGNOSIS — Z91419 Personal history of unspecified adult abuse: Secondary | ICD-10-CM | POA: Insufficient documentation

## 2015-06-03 DIAGNOSIS — M5442 Lumbago with sciatica, left side: Secondary | ICD-10-CM | POA: Insufficient documentation

## 2015-06-03 DIAGNOSIS — K5730 Diverticulosis of large intestine without perforation or abscess without bleeding: Secondary | ICD-10-CM

## 2015-06-03 DIAGNOSIS — N50812 Left testicular pain: Secondary | ICD-10-CM | POA: Insufficient documentation

## 2015-06-03 DIAGNOSIS — K210 Gastro-esophageal reflux disease with esophagitis: Secondary | ICD-10-CM

## 2015-06-03 DIAGNOSIS — E119 Type 2 diabetes mellitus without complications: Secondary | ICD-10-CM | POA: Insufficient documentation

## 2015-06-03 DIAGNOSIS — Z8719 Personal history of other diseases of the digestive system: Secondary | ICD-10-CM | POA: Insufficient documentation

## 2015-06-03 DIAGNOSIS — Z79899 Other long term (current) drug therapy: Secondary | ICD-10-CM | POA: Insufficient documentation

## 2015-06-03 LAB — URINALYSIS, ROUTINE W REFLEX MICROSCOPIC
BILIRUBIN URINE: NEGATIVE
GLUCOSE, UA: NEGATIVE mg/dL
Hgb urine dipstick: NEGATIVE
KETONES UR: NEGATIVE mg/dL
Leukocytes, UA: NEGATIVE
Nitrite: NEGATIVE
PROTEIN: NEGATIVE mg/dL
Specific Gravity, Urine: 1.02 (ref 1.005–1.030)
pH: 5.5 (ref 5.0–8.0)

## 2015-06-03 MED ORDER — HYDROMORPHONE HCL 1 MG/ML IJ SOLN
1.0000 mg | Freq: Once | INTRAMUSCULAR | Status: AC
  Administered 2015-06-03: 1 mg via INTRAMUSCULAR
  Filled 2015-06-03: qty 1

## 2015-06-03 MED ORDER — KETOROLAC TROMETHAMINE 30 MG/ML IJ SOLN
60.0000 mg | Freq: Once | INTRAMUSCULAR | Status: AC
  Administered 2015-06-03: 60 mg via INTRAMUSCULAR
  Filled 2015-06-03: qty 2

## 2015-06-03 MED ORDER — CYCLOBENZAPRINE HCL 10 MG PO TABS: 10.0000 mg | ORAL_TABLET | Freq: Three times a day (TID) | ORAL | Status: DC | PRN

## 2015-06-03 MED ORDER — OXYCODONE-ACETAMINOPHEN 7.5-325 MG PO TABS: 1.0000 | ORAL_TABLET | ORAL | Status: DC | PRN

## 2015-06-03 MED ORDER — PREDNISONE 10 MG PO TABS: ORAL_TABLET | ORAL | Status: DC

## 2015-06-03 NOTE — ED Provider Notes (Signed)
CSN: [id number]     Arrival date & time 06/03/15  0910 History   First MD Initiated Contact with Patient 06/03/15 [phone]     Chief Complaint  Patient presents with  . Back Pain     (Consider location/radiation/quality/duration/timing/severity/associated sxs/prior Treatment) HPI   Mark Acosta is a 45 y.o. male who presents to the Emergency Department complaining of sudden onset of left low back pain two days ago.  He states that he was lying on the ground working on a lawnmower and felt and heard a "pop" in his lower back when he tried to stand up.  Pain has gradually been worsening since then.  He describes a sharp, burning pain to his left lower back that radiates into his left upper thigh, groin and left testicle.  He also reports some urinary hesitancy at times, but urinating normal amount.  Pain is worsened by movement.  He has taken a family member's percocet and tylenol yesterday with only mild relief.  He denies numbness or weakness of the lower extremities, abdominal pain, vomiting, incontinence of bowel or urine, fever, chills or penile pain, swelling of the scrotum.    Past Medical History  Diagnosis Date  . Gout   . Diverticula of colon   . Substance abuse     hx etoh, marijuana, cocaine. quit 02/2014  . 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 None to date      As of 05/11/15   Family History  Problem Relation Age of Onset  . Colon cancer Neg Hx    Social History  Substance Use Topics  . Smoking status: Never Smoker   . Smokeless tobacco: Never Used  . Alcohol Use: No     Comment: hx alcoholism. Now only a drink on special occasions (2-3 times a year)    Review of Systems  Constitutional: Negative for fever.  Respiratory: Negative for shortness of breath.   Gastrointestinal: Negative for vomiting, abdominal pain and constipation.  Genitourinary: Positive for difficulty urinating and  testicular pain. Negative for dysuria, hematuria, flank pain, decreased urine volume, penile swelling and scrotal swelling.  Musculoskeletal: Positive for back pain. Negative for joint swelling.  Skin: Negative for rash.  Neurological: Negative for weakness and numbness.  All other systems reviewed and are negative.     Allergies  Review of patient's allergies indicates no known allergies.  Home Medications   Prior to Admission medications   Medication Sig Start Date End Date Taking? Authorizing Provider  ibuprofen (ADVIL,MOTRIN) 600 MG tablet Take 1 tablet (600 mg total) by mouth every 8 (eight) hours as needed. Patient taking differently: Take 600 mg by mouth every 8 (eight) hours as needed for moderate pain.  09/15/14   Azalia Bilis, MD  lisinopril (PRINIVIL,ZESTRIL) 20 MG tablet TAKE 1 Tablet BY MOUTH ONCE DAILY 05/18/15   Jacquelin Hawking, PA-C  lovastatin (MEVACOR) 20 MG tablet Take 1 tablet (20 mg total) by mouth at bedtime. 02/17/15   Jacquelin Hawking, PA-C  metFORMIN (GLUCOPHAGE) 1000 MG tablet Take 1 tablet (1,000 mg total) by mouth 2 (two) times daily with a meal. Patient not taking: Reported on 05/21/2015 03/18/15   Jacquelin Hawking, PA-C  metFORMIN (GLUCOPHAGE-XR) 500 MG 24 hr tablet TAKE 1 Tablet BY MOUTH ONCE DAILY 05/18/15   Jacquelin Hawking, PA-C  metoprolol (LOPRESSOR) 50 MG tablet Take 1 tablet (50 mg total) by mouth 2 (two) times daily. 01/20/15  Jacquelin Hawking, PA-C  Omega-3 Fatty Acids (FISH OIL PO) Take 1 tablet by mouth 2 (two) times daily.    Historical Provider, MD  traZODone (DESYREL) 150 MG tablet Take 150 mg by mouth at bedtime.    Historical Provider, MD   BP 142/84 mmHg  Pulse 68  Temp(Src) 98.1 F (36.7 C) (Oral)  Resp 20  Ht  (1.676 m)  Wt 127.007 kg  BMI 45.21 kg/m2  SpO2 99% Physical Exam  Constitutional: He is oriented to person, place, and time. He appears well-developed and well-nourished. No distress.  HENT:  Head: Normocephalic and  atraumatic.  Neck: Normal range of motion. Neck supple.  Cardiovascular: Normal rate, regular rhythm, normal heart sounds and intact distal pulses.   No murmur heard. Pulmonary/Chest: Effort normal and breath sounds normal. No respiratory distress. He exhibits no tenderness.  Abdominal: Soft. He exhibits no distension and no mass. There is no tenderness. There is no rebound and no guarding. Hernia confirmed negative in the left inguinal area.  Genitourinary: Penis normal. Cremasteric reflex is present. Left testis shows tenderness. Left testis shows no mass and no swelling. Circumcised. No penile tenderness.  Musculoskeletal: He exhibits tenderness. He exhibits no edema.       Lumbar back: He exhibits tenderness and pain. He exhibits normal range of motion, no swelling, no deformity, no laceration and normal pulse.  ttp of the left lumbar paraspinal muscles.  No spinal tenderness.  DP pulses are brisk and symmetrical.  Distal sensation intact.  Pain reproduced with SLR on left.    Neurological: He is alert and oriented to person, place, and time. He has normal strength. No sensory deficit. He exhibits normal muscle tone. Coordination and gait normal.  Reflex Scores:      Patellar reflexes are 2+ on the right side and 2+ on the left side.      Achilles reflexes are 2+ on the right side and 2+ on the left side. Skin: Skin is warm and dry. No rash noted.  Nursing note and vitals reviewed.   ED Course  Procedures (including critical care time) Labs Review Labs Reviewed  URINALYSIS, ROUTINE W REFLEX MICROSCOPIC (NOT AT [facility])    Imaging Review Dg Lumbar Spine Complete  06/03/2015  CLINICAL DATA:  Low back pain on LEFT side for couple days, no leg pain or numbness, no known injury, history diabetes mellitus EXAM: LUMBAR SPINE - COMPLETE 4+ VIEW COMPARISON:  None; correlation CT abdomen and pelvis 09/02/2014 FINDINGS: 5 non-rib-bearing lumbar vertebra. Osseous mineralization normal. Vertebral body  and disc space heights maintained. Few tiny scattered endplate spurs. No acute fracture, subluxation or bone destruction. No spondylolysis. SI joints symmetric. IMPRESSION: No acute abnormalities. Electronically Signed   By: Ulyses Southward M.D.   On: 06/03/2015 10:07   I have personally reviewed and evaluated these images and lab results as part of my medical decision-making.   EKG Interpretation None      MDM   Final diagnoses:  Left-sided low back pain with left-sided sciatica   Pt is well appearing.  No focal neuro deficits on exam.  No concerning sx's for emergent neurological process.   I have also discussed possible disc related issue and importance of close PMD or orthopedic f/u.  He is feeling better after medications in the dept., no bowel or urine retention/incontinence.  Ambulates with steady gait.  Appears stable for d/c     Pauline Aus, PA-C 06/04/15 1727  Margarita Grizzle, MD 06/16/15 2330

## 2015-06-03 NOTE — ED Notes (Signed)
Pt states when he was standing he heard something pop in his lower back to his groin area

## 2015-06-03 NOTE — Discharge Instructions (Signed)
°  Sciatica °Sciatica is pain, weakness, numbness, or tingling along your sciatic nerve. The nerve starts in the lower back and runs down the back of each leg. Nerve damage or certain conditions pinch or put pressure on the sciatic nerve. This causes the pain, weakness, and other discomforts of sciatica. °HOME CARE  °· Only take medicine as told by your doctor. °· Apply ice to the affected area for 20 minutes. Do this 3-4 times a day for the first 48-72 hours. Then try heat in the same way. °· Exercise, stretch, or do your usual activities if these do not make your pain worse. °· Go to physical therapy as told by your doctor. °· Keep all doctor visits as told. °· Do not wear high heels or shoes that are not supportive. °· Get a firm mattress if your mattress is too soft to lessen pain and discomfort. °GET HELP RIGHT AWAY IF:  °· You cannot control when you poop (bowel movement) or pee (urinate). °· You have more weakness in your lower back, lower belly (pelvis), butt (buttocks), or legs. °· You have redness or puffiness (swelling) of your back. °· You have a burning feeling when you pee. °· You have pain that gets worse when you lie down. °· You have pain that wakes you from your sleep. °· Your pain is worse than past pain. °· Your pain lasts longer than 4 weeks. °· You are suddenly losing weight without reason. °MAKE SURE YOU:  °· Understand these instructions. °· Will watch this condition. °· Will get help right away if you are not doing well or get worse. °  °This information is not intended to replace advice given to you by your health care provider. Make sure you discuss any questions you have with your health care provider. °  °Document Released: 12/29/2007 Document Revised: 12/10/2014 Document Reviewed: 07/31/2011 °Elsevier Interactive Patient Education ©2016 Elsevier Inc. ° ° °

## 2015-06-04 ENCOUNTER — Telehealth: Payer: Self-pay | Admitting: Internal Medicine

## 2015-06-04 ENCOUNTER — Ambulatory Visit (HOSPITAL_COMMUNITY): Payer: Self-pay | Admitting: Anesthesiology

## 2015-06-04 ENCOUNTER — Ambulatory Visit (HOSPITAL_COMMUNITY)
Admission: RE | Admit: 2015-06-04 | Discharge: 2015-06-04 | Disposition: A | Discharge status: Home / Self Care | Payer: Self-pay | Source: Ambulatory Visit | Attending: Internal Medicine | Admitting: Internal Medicine

## 2015-06-04 ENCOUNTER — Encounter (HOSPITAL_COMMUNITY): Payer: Self-pay | Admitting: *Deleted

## 2015-06-04 ENCOUNTER — Encounter (HOSPITAL_COMMUNITY)
Admission: RE | Disposition: A | Discharge status: Home / Self Care | Payer: Self-pay | Source: Ambulatory Visit | Attending: Internal Medicine

## 2015-06-04 DIAGNOSIS — K5730 Diverticulosis of large intestine without perforation or abscess without bleeding: Secondary | ICD-10-CM | POA: Insufficient documentation

## 2015-06-04 DIAGNOSIS — K921 Melena: Secondary | ICD-10-CM | POA: Insufficient documentation

## 2015-06-04 DIAGNOSIS — R109 Unspecified abdominal pain: Secondary | ICD-10-CM | POA: Insufficient documentation

## 2015-06-04 DIAGNOSIS — Z79899 Other long term (current) drug therapy: Secondary | ICD-10-CM | POA: Insufficient documentation

## 2015-06-04 DIAGNOSIS — I10 Essential (primary) hypertension: Secondary | ICD-10-CM | POA: Insufficient documentation

## 2015-06-04 DIAGNOSIS — K2100 Gastro-esophageal reflux disease with esophagitis, without bleeding: Secondary | ICD-10-CM | POA: Insufficient documentation

## 2015-06-04 DIAGNOSIS — Z7984 Long term (current) use of oral hypoglycemic drugs: Secondary | ICD-10-CM | POA: Insufficient documentation

## 2015-06-04 DIAGNOSIS — K648 Other hemorrhoids: Secondary | ICD-10-CM | POA: Insufficient documentation

## 2015-06-04 DIAGNOSIS — E119 Type 2 diabetes mellitus without complications: Secondary | ICD-10-CM | POA: Insufficient documentation

## 2015-06-04 DIAGNOSIS — K259 Gastric ulcer, unspecified as acute or chronic, without hemorrhage or perforation: Secondary | ICD-10-CM | POA: Insufficient documentation

## 2015-06-04 HISTORY — PX: COLONOSCOPY WITH PROPOFOL: SHX5780

## 2015-06-04 HISTORY — PX: ESOPHAGOGASTRODUODENOSCOPY (EGD) WITH PROPOFOL: SHX5813

## 2015-06-04 LAB — GLUCOSE, CAPILLARY
GLUCOSE-CAPILLARY: 111 mg/dL — AB (ref 65–99)
Glucose-Capillary: 115 mg/dL — ABNORMAL HIGH (ref 65–99)

## 2015-06-04 SURGERY — COLONOSCOPY WITH PROPOFOL
Anesthesia: Monitor Anesthesia Care

## 2015-06-04 MED ORDER — LACTATED RINGERS IV SOLN
INTRAVENOUS | Status: DC
  Administered 2015-06-04: 1000 mL via INTRAVENOUS

## 2015-06-04 MED ORDER — ONDANSETRON HCL 4 MG/2ML IJ SOLN
4.0000 mg | Freq: Once | INTRAMUSCULAR | Status: AC
  Administered 2015-06-04: 4 mg via INTRAVENOUS

## 2015-06-04 MED ORDER — FENTANYL CITRATE (PF) 100 MCG/2ML IJ SOLN
INTRAMUSCULAR | Status: AC
  Filled 2015-06-04: qty 2

## 2015-06-04 MED ORDER — MIDAZOLAM HCL 2 MG/2ML IJ SOLN
INTRAMUSCULAR | Status: AC
  Filled 2015-06-04: qty 2

## 2015-06-04 MED ORDER — PROPOFOL 500 MG/50ML IV EMUL
INTRAVENOUS | Status: DC | PRN
  Administered 2015-06-04: 100 ug/kg/min via INTRAVENOUS
  Administered 2015-06-04: 09:00:00 via INTRAVENOUS

## 2015-06-04 MED ORDER — GLYCOPYRROLATE 0.2 MG/ML IJ SOLN
INTRAMUSCULAR | Status: AC
  Filled 2015-06-04: qty 1

## 2015-06-04 MED ORDER — LIDOCAINE VISCOUS 2 % MT SOLN
OROMUCOSAL | Status: AC
  Filled 2015-06-04: qty 15

## 2015-06-04 MED ORDER — ONDANSETRON HCL 4 MG/2ML IJ SOLN
INTRAMUSCULAR | Status: AC
  Filled 2015-06-04: qty 2

## 2015-06-04 MED ORDER — LIDOCAINE VISCOUS 2 % MT SOLN
15.0000 mL | OROMUCOSAL | Status: AC | PRN
  Administered 2015-06-04 (×2): 3 mL via OROMUCOSAL

## 2015-06-04 MED ORDER — ONDANSETRON HCL 4 MG/2ML IJ SOLN: 4.0000 mg | Freq: Once | INTRAMUSCULAR | Status: DC | PRN

## 2015-06-04 MED ORDER — PROPOFOL 10 MG/ML IV BOLUS
INTRAVENOUS | Status: AC
  Filled 2015-06-04: qty 40

## 2015-06-04 MED ORDER — MIDAZOLAM HCL 5 MG/5ML IJ SOLN
INTRAMUSCULAR | Status: DC | PRN
  Administered 2015-06-04 (×2): 2 mg via INTRAVENOUS

## 2015-06-04 MED ORDER — GLYCOPYRROLATE 0.2 MG/ML IJ SOLN
0.2000 mg | Freq: Once | INTRAMUSCULAR | Status: AC
  Administered 2015-06-04: 0.2 mg via INTRAVENOUS

## 2015-06-04 MED ORDER — MIDAZOLAM HCL 2 MG/2ML IJ SOLN
1.0000 mg | INTRAMUSCULAR | Status: DC | PRN
  Administered 2015-06-04: 2 mg via INTRAVENOUS

## 2015-06-04 MED ORDER — FENTANYL CITRATE (PF) 100 MCG/2ML IJ SOLN
25.0000 ug | INTRAMUSCULAR | Status: AC
  Administered 2015-06-04 (×2): 25 ug via INTRAVENOUS

## 2015-06-04 MED ORDER — FENTANYL CITRATE (PF) 100 MCG/2ML IJ SOLN
25.0000 ug | INTRAMUSCULAR | Status: DC | PRN
  Administered 2015-06-04 (×2): 50 ug via INTRAVENOUS

## 2015-06-04 MED ORDER — PROPOFOL 10 MG/ML IV BOLUS
INTRAVENOUS | Status: AC
  Filled 2015-06-04: qty 20

## 2015-06-04 NOTE — Interval H&P Note (Signed)
History and Physical Interval Note:  06/04/2015 8:40 AM  Mark Acosta  has presented today for surgery, with the diagnosis of rectal bleeding, heme postive stool  The various methods of treatment have been discussed with the patient and family. After consideration of risks, benefits and other options for treatment, the patient has consented to  Procedure(s) with comments: COLONOSCOPY WITH PROPOFOL (N/A) - 0845 ESOPHAGOGASTRODUODENOSCOPY (EGD) WITH PROPOFOL (N/A) as a surgical intervention .  The patient's history has been reviewed, patient examined, no change in status, stable for surgery.  I have reviewed the patient's chart and labs.  Questions were answered to the patient's satisfaction.     [name]  Pt denies any abd pain; rectal bleeding is primary issue; tcs today -+ EGD.  The risks, benefits, limitations, imponderables and alternatives regarding both EGD and colonoscopy have been reviewed with the patient. Questions have been answered. All parties agreeable.

## 2015-06-04 NOTE — Op Note (Signed)
[facility] [address] [place], [address]   ENDOSCOPY PROCEDURE REPORT  PATIENT: Mark, Acosta  MR#: [id number] BIRTHDATE: 1970/08/09 , 44  yrs. old GENDER: male ENDOSCOPIST: R.  Roetta Sessions, MD FACP [facility] REFERRED BY:  Jacquelin Hawking, PA-C PROCEDURE DATE:  06/20/2015 PROCEDURE:  EGD, diagnostic INDICATIONS:  abdominal pain. MEDICATIONS: Deep sedation per Dr.  [facility] ASA CLASS:      Class II  CONSENT: The risks, benefits, limitations, alternatives and imponderables have been discussed.  The potential for biopsy, esophogeal dilation, etc. have also been reviewed.  Questions have been answered.  All parties agreeable.  Please see the history and physical in the medical record for more information.  DESCRIPTION OF PROCEDURE: After the risks benefits and alternatives of the procedure were thoroughly explained, informed consent was obtained.  The [id number] ([id number]) endoscope was introduced through the mouth and advanced to the second portion of the duodenum , limited by Without limitations. The instrument was slowly withdrawn as the mucosa was fully examined. Estimated blood loss is zero unless otherwise noted in this procedure report.    5 mm erosions at the GE junction consistent with reflux injury.  No Barrett's epithelium seen; tubular esophagus patent throughout its course.  Stomach empty.  Normal-appearing gastric mucosa.  Patent pylorus.  Normal-appearing first and second duodenum.  Retroflexed views revealed no abnormalities.     The scope was then withdrawn from the patient and the procedure completed.  COMPLICATIONS: There were no immediate complications.  ENDOSCOPIC IMPRESSION: Mild erosive reflux esophagitis  RECOMMENDATIONS: Begin Protonix 40 mg daily.  See Colonoscopy report  REPEAT EXAM:  eSigned:  R. Roetta Sessions, MD Jerrel [facility] 20-Jun-2015 9:21 AM    CC:  CPT CODES: ICD CODES:  The ICD and CPT codes recommended  by this software are interpretations from the data that the clinical staff has captured with the software.  The verification of the translation of this report to the ICD and CPT codes and modifiers is the sole responsibility of the health care institution and practicing physician where this report was generated.  [employer] will not be held responsible for the validity of the ICD and CPT codes included on this report.  AMA assumes no liability for data contained or not contained herein. CPT is a Publishing rights manager of the [employer].  PATIENT NAME:  Mark, Acosta MR#: [id number]

## 2015-06-04 NOTE — Op Note (Signed)
[facility] [address] [place], [address]   1COLONOSCOPY PROCEDURE REPORT  PATIENT: Keeyon, Privitera  MR#: [id number] BIRTHDATE: May 25, 1970 , 44  yrs. old GENDER: male ENDOSCOPIST: R.  Roetta Sessions, MD FACP [facility] REFERRED LK:[id number] McElroy, PA-C PROCEDURE DATE:  June 14, 2015 PROCEDURE:   Colonoscopy, diagnostic INDICATIONS:Hematochezia; abdominal pain. MEDICATIONS: deep sedation per Dr.  [facility] ASA CLASS:       Class II  CONSENT: The risks, benefits, alternatives and imponderables including but not limited to bleeding, perforation as well as the possibility of a missed lesion have been reviewed.  The potential for biopsy, lesion removal, etc. have also been discussed. Questions have been answered.  All parties agreeable.  Please see the history and physical in the medical record for more information.  DESCRIPTION OF PROCEDURE:   After the risks benefits and alternatives of the procedure were thoroughly explained, informed consent was obtained.  The digital rectal exam revealed no abnormalities of the rectum.   The [id number] ([id number])  endoscope was introduced through the anus and advanced to the cecum, which was identified by both the appendix and ileocecal valve. No adverse events experienced.   The quality of the prep was adequate  The instrument was then slowly withdrawn as the colon was fully examined. Estimated blood loss is zero unless otherwise noted in this procedure report.      COLON FINDINGS: Anal papilla and internal hemorrhoids; otherwise, normal-appearing rectal mucosa.  Scattered left-sided diverticula; the remainder of the colonic mucosa appeared normal.  Retroflexion was performed. .  Withdrawal time=7 minutes 0 seconds.  The scope was withdrawn and the procedure completed. COMPLICATIONS: There were no immediate complications.  ENDOSCOPIC IMPRESSION: Internal hemorrhoids/anal papilla - likely source of  hematochezia. Colonic diverticulosis.  RECOMMENDATIONS: Begin Benefiber 2 tablespoons twice daily. Course of Anusol suppositories 1 per rectum twice a day -  10 days. See EGD report  eSigned:  R. Roetta Sessions, MD Jerrel Ivory [facility] 06/14/15 9:12 AM   cc:  CPT CODES: ICD CODES:  The ICD and CPT codes recommended by this software are interpretations from the data that the clinical staff has captured with the software.  The verification of the translation of this report to the ICD and CPT codes and modifiers is the sole responsibility of the health care institution and practicing physician where this report was generated.  [employer] will not be held responsible for the validity of the ICD and CPT codes included on this report.  AMA assumes no liability for data contained or not contained herein. CPT is a Publishing rights manager of the [employer].  PATIENT NAME:  Chanler, Mendonca MR#: [id number]

## 2015-06-04 NOTE — Transfer of Care (Signed)
Immediate Anesthesia Transfer of Care Note  Patient: Mark Acosta  Procedure(s) Performed: Procedure(s) with comments: COLONOSCOPY WITH PROPOFOL (N/A) - 0845 ESOPHAGOGASTRODUODENOSCOPY (EGD) WITH PROPOFOL (N/A)  Patient Location: PACU  Anesthesia Type:MAC  Level of Consciousness: awake and patient cooperative  Airway & Oxygen Therapy: Patient Spontanous Breathing and Patient connected to face mask oxygen  Post-op Assessment: Report given to RN, Post -op Vital signs reviewed and stable and Patient moving all extremities  Post vital signs: Reviewed and stable  Last Vitals:  Filed Vitals:   06/04/15 0840 06/04/15 0845  BP: 112/61 107/59  Temp:    Resp: 0 15    Complications: No apparent anesthesia complications

## 2015-06-04 NOTE — Anesthesia Postprocedure Evaluation (Signed)
Anesthesia Post Note  Patient: Mark Acosta  Procedure(s) Performed: Procedure(s) (LRB): COLONOSCOPY WITH PROPOFOL (N/A) ESOPHAGOGASTRODUODENOSCOPY (EGD) WITH PROPOFOL (N/A)  Patient location during evaluation: PACU Anesthesia Type: MAC Level of consciousness: awake, oriented and patient cooperative Pain management: pain level controlled Vital Signs Assessment: post-procedure vital signs reviewed and stable Respiratory status: nonlabored ventilation and respiratory function stable Cardiovascular status: blood pressure returned to baseline Postop Assessment: no signs of nausea or vomiting Anesthetic complications: no    Last Vitals:  Filed Vitals:   06/04/15 0840 06/04/15 0845  BP: 112/61 107/59  Temp:    Resp: 0 15    Last Pain:  Filed Vitals:   06/04/15 0851  PainSc: 6                  Danner Paulding J

## 2015-06-04 NOTE — Anesthesia Preprocedure Evaluation (Signed)
Anesthesia Evaluation  Patient identified by MRN, date of birth, ID band Patient awake    Reviewed: Allergy & Precautions, NPO status , Patient's Chart, lab work & pertinent test results  Airway Mallampati: III  TM Distance: >3 FB   Mouth opening: Limited Mouth Opening  Dental  (+) Poor Dentition, Missing, Chipped, Dental Advisory Given   Pulmonary neg pulmonary ROS,    breath sounds clear to auscultation       Cardiovascular hypertension,  Rhythm:Regular Rate:Normal     Neuro/Psych    GI/Hepatic (+)     substance abuse (in remission)  alcohol use, cocaine use and marijuana use,   Endo/Other  diabetes, Type 2  Renal/GU      Musculoskeletal   Abdominal   Peds  Hematology   Anesthesia Other Findings   Reproductive/Obstetrics                             Anesthesia Physical Anesthesia Plan  ASA: III  Anesthesia Plan: MAC   Post-op Pain Management:    Induction: Intravenous  Airway Management Planned: Simple Face Mask  Additional Equipment:   Intra-op Plan:   Post-operative Plan:   Informed Consent: I have reviewed the patients History and Physical, chart, labs and discussed the procedure including the risks, benefits and alternatives for the proposed anesthesia with the patient or authorized representative who has indicated his/her understanding and acceptance.     Plan Discussed with:   Anesthesia Plan Comments:         Anesthesia Quick Evaluation

## 2015-06-04 NOTE — Discharge Instructions (Signed)
Colonoscopy Discharge Instructions  Read the instructions outlined below and refer to this sheet in the next few weeks. These discharge instructions provide you with general information on caring for yourself after you leave the hospital. Your doctor may also give you specific instructions. While your treatment has been planned according to the most current medical practices available, unavoidable complications occasionally occur. If you have any problems or questions after discharge, call Dr. Gala Romney at [phone]. ACTIVITY  You may resume your regular activity, but move at a slower pace for the next 24 hours.   Take frequent rest periods for the next 24 hours.   Walking will help get rid of the air and reduce the bloated feeling in your belly (abdomen).   No driving for 24 hours (because of the medicine (anesthesia) used during the test).    Do not sign any important legal documents or operate any machinery for 24 hours (because of the anesthesia used during the test).  NUTRITION  Drink plenty of fluids.   You may resume your normal diet as instructed by your doctor.   Begin with a light meal and progress to your normal diet. Heavy or fried foods are harder to digest and may make you feel sick to your stomach (nauseated).   Avoid alcoholic beverages for 24 hours or as instructed.  MEDICATIONS  You may resume your normal medications unless your doctor tells you otherwise.  WHAT YOU CAN EXPECT TODAY  Some feelings of bloating in the abdomen.   Passage of more gas than usual.   Spotting of blood in your stool or on the toilet paper.  IF YOU HAD POLYPS REMOVED DURING THE COLONOSCOPY:  No aspirin products for 7 days or as instructed.   No alcohol for 7 days or as instructed.   Eat a soft diet for the next 24 hours.  FINDING OUT THE RESULTS OF YOUR TEST Not all test results are available during your visit. If your test results are not back during the visit, make an appointment  with your caregiver to find out the results. Do not assume everything is normal if you have not heard from your caregiver or the medical facility. It is important for you to follow up on all of your test results.  SEEK IMMEDIATE MEDICAL ATTENTION IF:  You have more than a spotting of blood in your stool.   Your belly is swollen (abdominal distention).   You are nauseated or vomiting.   You have a temperature over 101.   You have abdominal pain or discomfort that is severe or gets worse throughout the day.   EGD Discharge instructions Please read the instructions outlined below and refer to this sheet in the next few weeks. These discharge instructions provide you with general information on caring for yourself after you leave the hospital. Your doctor may also give you specific instructions. While your treatment has been planned according to the most current medical practices available, unavoidable complications occasionally occur. If you have any problems or questions after discharge, please call your doctor. ACTIVITY  You may resume your regular activity but move at a slower pace for the next 24 hours.   Take frequent rest periods for the next 24 hours.   Walking will help expel (get rid of) the air and reduce the bloated feeling in your abdomen.   No driving for 24 hours (because of the anesthesia (medicine) used during the test).   You may shower.   Do not sign any  important legal documents or operate any machinery for 24 hours (because of the anesthesia used during the test).  NUTRITION  Drink plenty of fluids.   You may resume your normal diet.   Begin with a light meal and progress to your normal diet.   Avoid alcoholic beverages for 24 hours or as instructed by your caregiver.  MEDICATIONS  You may resume your normal medications unless your caregiver tells you otherwise.  WHAT YOU CAN EXPECT TODAY  You may experience abdominal discomfort such as a feeling of  fullness or gas pains.  FOLLOW-UP  Your doctor will discuss the results of your test with you.  SEEK IMMEDIATE MEDICAL ATTENTION IF ANY OF THE FOLLOWING OCCUR:  Excessive nausea (feeling sick to your stomach) and/or vomiting.   Severe abdominal pain and distention (swelling).   Trouble swallowing.   Temperature over 101 F (37.8 C).   Rectal bleeding or vomiting of blood.     GERD, hemorrhoids and diverticulosis information provided  Begin Protonix 40 mg daily  10 day course of Anusol suppositories 1 per rectum twice daily  Office visit with [place] in 3 months Diverticulosis Diverticulosis is the condition that develops when small pouches (diverticula) form in the wall of your colon. Your colon, or large intestine, is where water is absorbed and stool is formed. The pouches form when the inside layer of your colon pushes through weak spots in the outer layers of your colon. CAUSES  No one knows exactly what causes diverticulosis. RISK FACTORS  Being older than 50. Your risk for this condition increases with age. Diverticulosis is rare in people younger than 40 years. By age 30, almost everyone has it.  Eating a low-fiber diet.  Being frequently constipated.  Being overweight.  Not getting enough exercise.  Smoking.  Taking over-the-counter pain medicines, like aspirin and ibuprofen. SYMPTOMS  Most people with diverticulosis do not have symptoms. DIAGNOSIS  Because diverticulosis often has no symptoms, health care providers often discover the condition during an exam for other colon problems. In many cases, a health care provider will diagnose diverticulosis while using a flexible scope to examine the colon (colonoscopy). TREATMENT  If you have never developed an infection related to diverticulosis, you may not need treatment. If you have had an infection before, treatment may include:  Eating more fruits, vegetables, and grains.  Taking a fiber  supplement.  Taking a live bacteria supplement (probiotic).  Taking medicine to relax your colon. HOME CARE INSTRUCTIONS   Drink at least 6-8 glasses of water each day to prevent constipation.  Try not to strain when you have a bowel movement.  Keep all follow-up appointments. If you have had an infection before:  Increase the fiber in your diet as directed by your health care provider or dietitian.  Take a dietary fiber supplement if your health care provider approves.  Only take medicines as directed by your health care provider. SEEK MEDICAL CARE IF:   You have abdominal pain.  You have bloating.  You have cramps.  You have not gone to the bathroom in 3 days. SEEK IMMEDIATE MEDICAL CARE IF:   Your pain gets worse.  Yourbloating becomes very bad.  You have a fever or chills, and your symptoms suddenly get worse.  You begin vomiting.  You have bowel movements that are bloody or black. MAKE SURE YOU:  Understand these instructions.  Will watch your condition.  Will get help right away if you are not doing well or get  worse.   This information is not intended to replace advice given to you by your health care provider. Make sure you discuss any questions you have with your health care provider.   Document Released: 12/17/2003 Document Revised: 03/26/2013 Document Reviewed: 02/13/2013 Elsevier Interactive Patient Education 2016 [employer] Gastroesophageal Reflux Disease, Adult Normally, food travels down the esophagus and stays in the stomach to be digested. However, when a person has gastroesophageal reflux disease (GERD), food and stomach acid move back up into the esophagus. When this happens, the esophagus becomes sore and inflamed. Over time, GERD can create small holes (ulcers) in the lining of the esophagus.  CAUSES This condition is caused by a problem with the muscle between the esophagus and the stomach (lower esophageal sphincter, or LES).  Normally, the LES muscle closes after food passes through the esophagus to the stomach. When the LES is weakened or abnormal, it does not close properly, and that allows food and stomach acid to go back up into the esophagus. The LES can be weakened by certain dietary substances, medicines, and medical conditions, including:  Tobacco use.  Pregnancy.  Having a hiatal hernia.  Heavy alcohol use.  Certain foods and beverages, such as coffee, chocolate, onions, and peppermint. RISK FACTORS This condition is more likely to develop in:  People who have an increased body weight.  People who have connective tissue disorders.  People who use NSAID medicines. SYMPTOMS Symptoms of this condition include:  Heartburn.  Difficult or painful swallowing.  The feeling of having a lump in the throat.  Abitter taste in the mouth.  Bad breath.  Having a large amount of saliva.  Having an upset or bloated stomach.  Belching.  Chest pain.  Shortness of breath or wheezing.  Ongoing (chronic) cough or a night-time cough.  Wearing away of tooth enamel.  Weight loss. Different conditions can cause chest pain. Make sure to see your health care provider if you experience chest pain. DIAGNOSIS Your health care provider will take a medical history and perform a physical exam. To determine if you have mild or severe GERD, your health care provider may also monitor how you respond to treatment. You may also have other tests, including:  An endoscopy toexamine your stomach and esophagus with a small camera.  A test thatmeasures the acidity level in your esophagus.  A test thatmeasures how much pressure is on your esophagus.  A barium swallow or modified barium swallow to show the shape, size, and functioning of your esophagus. TREATMENT The goal of treatment is to help relieve your symptoms and to prevent complications. Treatment for this condition may vary depending on how severe your  symptoms are. Your health care provider may recommend:  Changes to your diet.  Medicine.  Surgery. HOME CARE INSTRUCTIONS Diet  Follow a diet as recommended by your health care provider. This may involve avoiding foods and drinks such as:  Coffee and tea (with or without caffeine).  Drinks that containalcohol.  Energy drinks and sports drinks.  Carbonated drinks or sodas.  Chocolate and cocoa.  Peppermint and mint flavorings.  Garlic and onions.  Horseradish.  Spicy and acidic foods, including peppers, chili powder, curry powder, vinegar, hot sauces, and barbecue sauce.  Citrus fruit juices and citrus fruits, such as oranges, lemons, and limes.  Tomato-based foods, such as red sauce, chili, salsa, and pizza with red sauce.  Fried and fatty foods, such as donuts, french fries, potato chips, and high-fat dressings.  High-fat meats,  such as hot dogs and fatty cuts of red and white meats, such as rib eye steak, sausage, ham, and bacon.  High-fat dairy items, such as whole milk, butter, and cream cheese.  Eat small, frequent meals instead of large meals.  Avoid drinking large amounts of liquid with your meals.  Avoid eating meals during the 2-3 hours before bedtime.  Avoid lying down right after you eat.  Do not exercise right after you eat. General Instructions  Pay attention to any changes in your symptoms.  Take over-the-counter and prescription medicines only as told by your health care provider. Do not take aspirin, ibuprofen, or other NSAIDs unless your health care provider told you to do so.  Do not use any tobacco products, including cigarettes, chewing tobacco, and e-cigarettes. If you need help quitting, ask your health care provider.  Wear loose-fitting clothing. Do not wear anything tight around your waist that causes pressure on your abdomen.  Raise (elevate) the head of your bed 6 inches (15cm).  Try to reduce your stress, such as with yoga or  meditation. If you need help reducing stress, ask your health care provider.  If you are overweight, reduce your weight to an amount that is healthy for you. Ask your health care provider for guidance about a safe weight loss goal.  Keep all follow-up visits as told by your health care provider. This is important. SEEK MEDICAL CARE IF:  You have new symptoms.  You have unexplained weight loss.  You have difficulty swallowing, or it hurts to swallow.  You have wheezing or a persistent cough.  Your symptoms do not improve with treatment.  You have a hoarse voice. SEEK IMMEDIATE MEDICAL CARE IF:  You have pain in your arms, neck, jaw, teeth, or back.  You feel sweaty, dizzy, or light-headed.  You have chest pain or shortness of breath.  You vomit and your vomit looks like blood or coffee grounds.  You faint.  Your stool is bloody or black.  You cannot swallow, drink, or eat.   This information is not intended to replace advice given to you by your health care provider. Make sure you discuss any questions you have with your health care provider.   Document Released: 12/29/2004 Document Revised: 12/10/2014 Document Reviewed: 07/16/2014 Elsevier Interactive Patient Education 2016 [employer]. Hemorrhoids Hemorrhoids are puffy (swollen) veins around the rectum or anus. Hemorrhoids can cause pain, itching, bleeding, or irritation. HOME CARE  Eat foods with fiber, such as whole grains, beans, nuts, fruits, and vegetables. Ask your doctor about taking products with added fiber in them (fibersupplements).  Drink enough fluid to keep your pee (urine) clear or pale yellow.  Exercise often.  Go to the bathroom when you have the urge to poop. Do not wait.  Avoid straining to poop (bowel movement).  Keep the butt area dry and clean. Use wet toilet paper or moist paper towels.  Medicated creams and medicine inserted into the anus (anal suppository) may be used or applied as  told.  Only take medicine as told by your doctor.  Take a warm water bath (sitz bath) for 15-20 minutes to ease pain. Do this 3-4 times a day.  Place ice packs on the area if it is tender or puffy. Use the ice packs between the warm water baths.  Put ice in a plastic bag.  Place a towel between your skin and the bag.  Leave the ice on for 15-20 minutes, 03-04 times a day.  Do not use a donut-shaped pillow or sit on the toilet for a long time. GET HELP RIGHT AWAY IF:   You have more pain that is not controlled by treatment or medicine.  You have bleeding that will not stop.  You have trouble or are unable to poop (bowel movement).  You have pain or puffiness outside the area of the hemorrhoids. MAKE SURE YOU:   Understand these instructions.  Will watch your condition.  Will get help right away if you are not doing well or get worse.   This information is not intended to replace advice given to you by your health care provider. Make sure you discuss any questions you have with your health care provider.   Document Released: 12/29/2007 Document Revised: 03/07/2012 Document Reviewed: 01/31/2012 Elsevier Interactive Patient Education [employer]

## 2015-06-04 NOTE — H&P (View-Only) (Signed)
Primary Care Physician:  Jacquelin Hawking, PA-C Primary Gastroenterologist:  Dr. Jena Gauss  Chief Complaint  Patient presents with  . Blood In Stools    HPI:   45 year old male presents on referral from PCP for brbpr and heme+ stool. PCP notes reviewed. Heme positive stool first found in June 2016 in the emergency room when he presented with complaints of rectal bleeding. He has history of diverticula and when he was seen in the emergency room complaining of 3 days before seeing rectal bleeding which was initially just on the toilet tissue and then dark stools afterward. Also admitted blood in stool on baseline. He had a single positive iFobT. He is seen his primary care provider in October and December 2016 at which point he was urged to see GI. No history of endoscopy or colonoscopy in our system.  Today he states he has been having rectal bleeding for years, but "just put it on the back burner." Typically bright red and occasionally dark maroon and occasionally dark black. Also with some toilet tissue hematochezia. Has occasional abdominal pain but not much in the past month or so. Occasional morning nausea, denies vomiting. Denies fever, chills, unintentional weight loss. Has never had a colonoscopy or endoscopy. Has a bowel movement 4-5 times a day until recently, currently 2-3 times a day which changed about a month ago when starting percocet. First stool or two is harder then softens up as he goes about his day. After a couple bowel movements has rectal pain. Pain is throbbing. Denies chest pain, dyspnea, dizziness, lightheadedness, syncope, near syncope. Denies any other upper or lower GI symptoms.  His BP is elevated this morning but states he hasn't taken his medications yet. Advised him to take them as soon as he can.  Past Medical History  Diagnosis Date  . Gout   . Diverticula of colon   . Substance abuse     hx etoh, marijuana, cocaine. quit 02/2014    No past surgical history  on file.  Current Outpatient Prescriptions  Medication Sig Dispense Refill  . lisinopril (PRINIVIL,ZESTRIL) 20 MG tablet Take 20 mg by mouth daily.    Marland Kitchen lovastatin (MEVACOR) 20 MG tablet Take 1 tablet (20 mg total) by mouth at bedtime. 30 tablet 3  . metFORMIN (GLUCOPHAGE) 1000 MG tablet Take 1 tablet (1,000 mg total) by mouth 2 (two) times daily with a meal. 180 tablet 1  . metoprolol (LOPRESSOR) 50 MG tablet Take 1 tablet (50 mg total) by mouth 2 (two) times daily. 180 tablet 1  . Omega-3 Fatty Acids (FISH OIL PO) Take 1 tablet by mouth 2 (two) times daily.    . traZODone (DESYREL) 150 MG tablet Take 150 mg by mouth at bedtime.    Marland Kitchen ibuprofen (ADVIL,MOTRIN) 600 MG tablet Take 1 tablet (600 mg total) by mouth every 8 (eight) hours as needed. (Patient not taking: Reported on 05/11/2015) 15 tablet 0   No current facility-administered medications for this visit.    Allergies as of 05/11/2015  . (No Known Allergies)    No family history on file.  Social History   Social History  . Marital Status: Single    Spouse Name: N/A  . Number of Children: N/A  . Years of Education: N/A   Occupational History  . Not on file.   Social History Main Topics  . Smoking status: Never Smoker   . Smokeless tobacco: Not on file  . Alcohol Use: No  Comment: hx alcoholism.   . Drug Use: No     Comment: hx addiction- marij and cocaine  . Sexual Activity: Not on file   Other Topics Concern  . Not on file   Social History Narrative    Review of Systems: General: Negative for anorexia, weight loss, fever, chills, fatigue, weakness. Eyes: Negative for vision changes.  ENT: Negative for hoarseness, difficulty swallowing. CV: Negative for chest pain, angina, palpitations, peripheral edema.  Respiratory: Negative for dyspnea at rest, cough, sputum, wheezing.  GI: See history of present illness. MS: Admits chronic back pain.  Derm: Negative for rash or itching.  Endo: Negative for unusual  weight change.  Heme: Negative for bruising or bleeding.    Physical Exam: BP 172/99 mmHg  Pulse 83  Temp(Src) 98.6 F (37 C) (Oral)  Ht  (1.676 m)  Wt 278 lb 9.6 oz (126.372 kg)  BMI 44.99 kg/m2 General:   Morbidly obese male, alert and oriented. Pleasant and cooperative. Well-nourished and well-developed.  Head:  Normocephalic and atraumatic. Eyes:  Without icterus, sclera clear and conjunctiva pink.  Ears:  Normal auditory acuity. Cardiovascular:  S1, S2 present without murmurs appreciated. Extremities without clubbing or edema. Respiratory:  Clear to auscultation bilaterally. No wheezes, rales, or rhonchi. No distress.  Gastrointestinal:  +BS, obese but soft, non-tender and non-distended. No HSM noted. No guarding or rebound. No masses appreciated.  Rectal:  Deferred  Musculoskalatal:  Symmetrical without gross deformities. Skin:  Intact without significant lesions or rashes. Neurologic:  Alert and oriented x4;  grossly normal neurologically. Psych:  Alert and cooperative. Normal mood and affect. Heme/Lymph/Immune: No excessive bruising noted.    05/11/2015 8:50 AM

## 2015-06-04 NOTE — Telephone Encounter (Signed)
Corbin Ade, MD  Ferne Reus            Keep the one scheduled       Previous Messages     ----- Message -----   From: Ferne Reus   Sent: 06/04/2015  9:46 AM    To: Corbin Ade, MD   Cliffton Asters, RN from Short Stay called to make post procedure follow up in 3 months with [place], but patient already has OV follow up on 4/6 with Minerva Areola. Should patient come in April or wait 3 months?

## 2015-06-07 ENCOUNTER — Emergency Department (HOSPITAL_COMMUNITY)
Admission: EM | Admit: 2015-06-07 | Discharge: 2015-06-07 | Disposition: A | Discharge status: Home / Self Care | Payer: Self-pay | Attending: Emergency Medicine | Admitting: Emergency Medicine

## 2015-06-07 ENCOUNTER — Encounter (HOSPITAL_COMMUNITY): Payer: Self-pay

## 2015-06-07 DIAGNOSIS — M5432 Sciatica, left side: Secondary | ICD-10-CM | POA: Insufficient documentation

## 2015-06-07 DIAGNOSIS — M5442 Lumbago with sciatica, left side: Secondary | ICD-10-CM

## 2015-06-07 DIAGNOSIS — E119 Type 2 diabetes mellitus without complications: Secondary | ICD-10-CM | POA: Insufficient documentation

## 2015-06-07 DIAGNOSIS — Z79899 Other long term (current) drug therapy: Secondary | ICD-10-CM | POA: Insufficient documentation

## 2015-06-07 DIAGNOSIS — Z7984 Long term (current) use of oral hypoglycemic drugs: Secondary | ICD-10-CM | POA: Insufficient documentation

## 2015-06-07 MED ORDER — DIAZEPAM 5 MG PO TABS: 5.0000 mg | ORAL_TABLET | Freq: Two times a day (BID) | ORAL | Status: DC

## 2015-06-07 MED ORDER — DICLOFENAC SODIUM 50 MG PO TBEC: 50.0000 mg | DELAYED_RELEASE_TABLET | Freq: Two times a day (BID) | ORAL | Status: DC

## 2015-06-07 MED ORDER — METHYLPREDNISOLONE SODIUM SUCC 125 MG IJ SOLR
125.0000 mg | Freq: Once | INTRAMUSCULAR | Status: AC
  Administered 2015-06-07: 125 mg via INTRAMUSCULAR
  Filled 2015-06-07: qty 2

## 2015-06-07 NOTE — Discharge Instructions (Signed)
Muscle Cramps and Spasms Muscle cramps and spasms occur when a muscle or muscles tighten and you have no control over this tightening (involuntary muscle contraction). They are a common problem and can develop in any muscle. The most common place is in the calf muscles of the leg. Both muscle cramps and muscle spasms are involuntary muscle contractions, but they also have differences:   Muscle cramps are sporadic and painful. They may last a few seconds to a quarter of an hour. Muscle cramps are often more forceful and last longer than muscle spasms.  Muscle spasms may or may not be painful. They may also last just a few seconds or much longer. CAUSES  It is uncommon for cramps or spasms to be due to a serious underlying problem. In many cases, the cause of cramps or spasms is unknown. Some common causes are:   Overexertion.   Overuse from repetitive motions (doing the same thing over and over).   Remaining in a certain position for a long period of time.   Improper preparation, form, or technique while performing a sport or activity.   Dehydration.   Injury.   Side effects of some medicines.   Abnormally low levels of the salts and ions in your blood (electrolytes), especially potassium and calcium. This could happen if you are taking water pills (diuretics) or you are pregnant.  Some underlying medical problems can make it more likely to develop cramps or spasms. These include, but are not limited to:   Diabetes.   Parkinson disease.   Hormone disorders, such as thyroid problems.   Alcohol abuse.   Diseases specific to muscles, joints, and bones.   Blood vessel disease where not enough blood is getting to the muscles.  HOME CARE INSTRUCTIONS   Stay well hydrated. Drink enough water and fluids to keep your urine clear or pale yellow.  It may be helpful to massage, stretch, and relax the affected muscle.  For tight or tense muscles, use a warm towel, heating  pad, or hot shower water directed to the affected area.  If you are sore or have pain after a cramp or spasm, applying ice to the affected area may relieve discomfort.  Put ice in a plastic bag.  Place a towel between your skin and the bag.  Leave the ice on for 15-20 minutes, 03-04 times a day.  Medicines used to treat a known cause of cramps or spasms may help reduce their frequency or severity. Only take over-the-counter or prescription medicines as directed by your caregiver. SEEK MEDICAL CARE IF:  Your cramps or spasms get more severe, more frequent, or do not improve over time.  MAKE SURE YOU:   Understand these instructions.  Will watch your condition.  Will get help right away if you are not doing well or get worse.   This information is not intended to replace advice given to you by your health care provider. Make sure you discuss any questions you have with your health care provider.   Document Released: 09/10/2001 Document Revised: 07/16/2012 Document Reviewed: 03/07/2012 Elsevier Interactive Patient Education 2016 Elsevier Inc. Sciatica Sciatica is pain, weakness, numbness, or tingling along the path of the sciatic nerve. The nerve starts in the lower back and runs down the back of each leg. The nerve controls the muscles in the lower leg and in the back of the knee, while also providing sensation to the back of the thigh, lower leg, and the sole of your foot. Sciatica  is a symptom of another medical condition. For instance, nerve damage or certain conditions, such as a herniated disk or bone spur on the spine, pinch or put pressure on the sciatic nerve. This causes the pain, weakness, or other sensations normally associated with sciatica. Generally, sciatica only affects one side of the body. CAUSES   Herniated or slipped disc.  Degenerative disk disease.  A pain disorder involving the narrow muscle in the buttocks (piriformis syndrome).  Pelvic injury or  fracture.  Pregnancy.  Tumor (rare). SYMPTOMS  Symptoms can vary from mild to very severe. The symptoms usually travel from the low back to the buttocks and down the back of the leg. Symptoms can include:  Mild tingling or dull aches in the lower back, leg, or hip.  Numbness in the back of the calf or sole of the foot.  Burning sensations in the lower back, leg, or hip.  Sharp pains in the lower back, leg, or hip.  Leg weakness.  Severe back pain inhibiting movement. These symptoms may get worse with coughing, sneezing, laughing, or prolonged sitting or standing. Also, being overweight may worsen symptoms. DIAGNOSIS  Your caregiver will perform a physical exam to look for common symptoms of sciatica. He or she may ask you to do certain movements or activities that would trigger sciatic nerve pain. Other tests may be performed to find the cause of the sciatica. These may include:  Blood tests.  X-rays.  Imaging tests, such as an MRI or CT scan. TREATMENT  Treatment is directed at the cause of the sciatic pain. Sometimes, treatment is not necessary and the pain and discomfort goes away on its own. If treatment is needed, your caregiver may suggest:  Over-the-counter medicines to relieve pain.  Prescription medicines, such as anti-inflammatory medicine, muscle relaxants, or narcotics.  Applying heat or ice to the painful area.  Steroid injections to lessen pain, irritation, and inflammation around the nerve.  Reducing activity during periods of pain.  Exercising and stretching to strengthen your abdomen and improve flexibility of your spine. Your caregiver may suggest losing weight if the extra weight makes the back pain worse.  Physical therapy.  Surgery to eliminate what is pressing or pinching the nerve, such as a bone spur or part of a herniated disk. HOME CARE INSTRUCTIONS   Only take over-the-counter or prescription medicines for pain or discomfort as directed by  your caregiver.  Apply ice to the affected area for 20 minutes, 3-4 times a day for the first 48-72 hours. Then try heat in the same way.  Exercise, stretch, or perform your usual activities if these do not aggravate your pain.  Attend physical therapy sessions as directed by your caregiver.  Keep all follow-up appointments as directed by your caregiver.  Do not wear high heels or shoes that do not provide proper support.  Check your mattress to see if it is too soft. A firm mattress may lessen your pain and discomfort. SEEK IMMEDIATE MEDICAL CARE IF:   You lose control of your bowel or bladder (incontinence).  You have increasing weakness in the lower back, pelvis, buttocks, or legs.  You have redness or swelling of your back.  You have a burning sensation when you urinate.  You have pain that gets worse when you lie down or awakens you at night.  Your pain is worse than you have experienced in the past.  Your pain is lasting longer than 4 weeks.  You are suddenly losing weight without  reason. MAKE SURE YOU:  Understand these instructions.  Will watch your condition.  Will get help right away if you are not doing well or get worse.   This information is not intended to replace advice given to you by your health care provider. Make sure you discuss any questions you have with your health care provider.   Document Released: 03/15/2001 Document Revised: 12/10/2014 Document Reviewed: 07/31/2011 Elsevier Interactive Patient Education [employer]2016 Elsevier Inc.

## 2015-06-07 NOTE — ED Notes (Signed)
Pt ambulated from BR with slight limp- PA evaluation in progress

## 2015-06-07 NOTE — ED Notes (Signed)
Patient reports of left sided lower back pain that started 06-01-15. 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 Reports of dysuria. Ambulatory to triage.

## 2015-06-07 NOTE — ED Provider Notes (Signed)
CSN: [id number]     Arrival date & time 06/07/15  1109 History  By signing my name below, I, Ronney LionSuzanne Le, attest that this documentation has been prepared under the direction and in the presence of Langston MaskerKaren Carrine Kroboth, [place]PA-C. Electronically Signed: Ronney LionSuzanne Le, ED Scribe. 06/07/2015. 12:27 PM.    Chief Complaint  Patient presents with  . Back Pain   The history is provided by the patient. No language interpreter was used.    HPI Comments: Mark Acosta is a 45 y.o. male with a history of DM, who presents to the Emergency Department complaining of constant, 9/10, left-sided lower back pain that began 6 days ago. He states he was seen at the ED about 4 days ago for the same and was prescribed prednisone, Flexeril, and Percocet, all of which he has taken with minimal relief. He states he was told to return to the ED for an MRI if his pain worsens, and he states his pain has worsened significantly since being seen 4 days ago. He denies a history of any chronic medical conditions, including DM. He denies a history of history of smoking or EtOH consumption. He denies any extremity pain. Patient has NKDA. 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  Past Medical History  Diagnosis Date  . Gout   . Diverticula of colon   . Substance abuse     hx etoh, marijuana, cocaine. quit 02/2014  . 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 None to date      As of 05/11/15   Family History  Problem Relation Age of Onset  . Colon cancer Neg Hx    Social History  Substance Use Topics  . Smoking status: Never Smoker   . Smokeless tobacco: Never Used  . Alcohol Use: No     Comment: hx alcoholism. Now only a drink on special occasions (2-3 times a year)    Review of Systems  Musculoskeletal: Positive for back pain.  Neurological: Negative for weakness and  numbness.  All other systems reviewed and are negative.   Allergies  Review of patient's allergies indicates no known allergies.  Home Medications   Prior to Admission medications   Medication Sig Start Date End Date Taking? Authorizing Provider  acetaminophen (TYLENOL) 500 MG tablet Take 1,000 mg by mouth every 6 (six) hours as needed.    Historical Provider, MD  cyclobenzaprine (FLEXERIL) 10 MG tablet Take 1 tablet (10 mg total) by mouth 3 (three) times daily as needed. 06/03/15   Tammy Triplett, PA-C  gabapentin (NEURONTIN) 400 MG capsule Take 400 mg by mouth as needed.     Historical Provider, MD  ibuprofen (ADVIL,MOTRIN) 600 MG tablet Take 1 tablet (600 mg total) by mouth every 8 (eight) hours as needed. Patient taking differently: Take 600 mg by mouth every 8 (eight) hours as needed for moderate pain.  09/15/14   Azalia BilisKevin Campos, MD  lisinopril (PRINIVIL,ZESTRIL) 20 MG tablet TAKE 1 Tablet BY MOUTH ONCE DAILY 05/18/15   Jacquelin HawkingShannon McElroy, PA-C  lovastatin (MEVACOR) 20 MG tablet Take 1 tablet (20 mg total) by mouth at bedtime. 02/17/15   Jacquelin HawkingShannon McElroy, PA-C  metFORMIN (GLUCOPHAGE-XR) 500 MG 24 hr tablet TAKE 1 Tablet BY MOUTH ONCE DAILY 05/18/15   Jacquelin HawkingShannon McElroy, PA-C  metoprolol (LOPRESSOR) 50 MG tablet Take 1 tablet (50 mg  total) by mouth 2 (two) times daily. 01/20/15   Jacquelin Hawking, PA-C  Omega-3 Fatty Acids (FISH OIL PO) Take 1 tablet by mouth 2 (two) times daily. Reported on 06/03/2015    Historical Provider, MD  oxyCODONE-acetaminophen (PERCOCET) 7.5-325 MG tablet Take 1 tablet by mouth every 4 (four) hours as needed for severe pain. 06/03/15   Tammy Triplett, PA-C  predniSONE (DELTASONE) 10 MG tablet Take 6 tablets day one, 5 tablets day two, 4 tablets day three, 3 tablets day four, 2 tablets day five, then 1 tablet day six 06/03/15   Tammy Triplett, PA-C  traZODone (DESYREL) 150 MG tablet Take 150 mg by mouth at bedtime.    Historical Provider, MD   BP 122/75 mmHg  Pulse 83  Temp(Src)  98.2 F (36.8 C) (Oral)  Resp 18  Ht  (1.676 m)  Wt 280 lb (127.007 kg)  BMI 45.21 kg/m2  SpO2 98% Physical Exam  Constitutional: He is oriented to person, place, and time. He appears well-developed and well-nourished. No distress.  HENT:  Head: Normocephalic and atraumatic.  Eyes: Conjunctivae and EOM are normal.  Neck: Neck supple. No tracheal deviation present.  Cardiovascular: Normal rate.   Pulmonary/Chest: Effort normal. No respiratory distress.  Musculoskeletal: Normal range of motion. He exhibits tenderness.  Tender left sciatic notch. Pain with ROM. No spinal tenderness.  Neurological: He is alert and oriented to person, place, and time.  Skin: Skin is warm and dry.  Psychiatric: He has a normal mood and affect. His behavior is normal.  Nursing note and vitals reviewed.   ED Course  Procedures (including critical care time)  DIAGNOSTIC STUDIES: Oxygen Saturation is 98% on RA, normal by my interpretation.    COORDINATION OF CARE: 12:21 PM - Discussed treatment plan with pt at bedside. Pt verbalized understanding and agreed to plan.   Labs Review Labs Reviewed - No data to display  Imaging Review No results found. I have personally reviewed and evaluated these images and lab results as part of my medical decision-making.   EKG Interpretation None      MDM   Final diagnoses:  Low back pain with left-sided sciatica, unspecified back pain laterality    Meds ordered this encounter  Medications  . methylPREDNISolone sodium succinate (SOLU-MEDROL) 125 mg/2 mL injection 125 mg    Sig:   . diclofenac (VOLTAREN) 50 MG EC tablet    Sig: Take 1 tablet (50 mg total) by mouth 2 (two) times daily.    Dispense:  20 tablet    Refill:  0    Order Specific Question:  Supervising Provider    Answer:  MILLER, BRIAN [[id number]]  . diazepam (VALIUM) 5 MG tablet    Sig: Take 1 tablet (5 mg total) by mouth 2 (two) times daily.    Dispense:  10 tablet    Refill:  0     Order Specific Question:  Supervising Provider    Answer:  Eber Hong [[id number]]  An After Visit Summary was printed and given to the patient..  I personally performed the services in this documentation, which was scribed in my presence.  The recorded information has been reviewed and considered.   Barnet Pall.   Lonia Skinner Burnham, PA-C 06/07/15 1302  Bethann Berkshire, MD 06/07/15 1350

## 2015-06-08 ENCOUNTER — Encounter (HOSPITAL_COMMUNITY): Payer: Self-pay | Admitting: Internal Medicine

## 2015-06-17 ENCOUNTER — Ambulatory Visit: Payer: Self-pay | Admitting: Physician Assistant

## 2015-06-25 ENCOUNTER — Encounter: Payer: Self-pay | Admitting: Physician Assistant

## 2015-07-06 ENCOUNTER — Emergency Department (HOSPITAL_COMMUNITY)
Admission: EM | Admit: 2015-07-06 | Discharge: 2015-07-06 | Disposition: A | Discharge status: Other Acute Inpatient Hospital | Payer: Self-pay | Attending: Emergency Medicine | Admitting: Emergency Medicine

## 2015-07-06 ENCOUNTER — Encounter (HOSPITAL_COMMUNITY): Payer: Self-pay | Admitting: Emergency Medicine

## 2015-07-06 ENCOUNTER — Inpatient Hospital Stay (HOSPITAL_COMMUNITY)
Admission: AD | Admit: 2015-07-06 | Discharge: 2015-07-22 | DRG: 885 | Disposition: A | Discharge status: Rehabilitation Facility | Payer: No Typology Code available for payment source | Source: Intra-hospital | Attending: Psychiatry | Admitting: Psychiatry

## 2015-07-06 DIAGNOSIS — R45851 Suicidal ideations: Secondary | ICD-10-CM | POA: Insufficient documentation

## 2015-07-06 DIAGNOSIS — E785 Hyperlipidemia, unspecified: Secondary | ICD-10-CM | POA: Diagnosis present

## 2015-07-06 DIAGNOSIS — G4700 Insomnia, unspecified: Secondary | ICD-10-CM | POA: Diagnosis present

## 2015-07-06 DIAGNOSIS — E1165 Type 2 diabetes mellitus with hyperglycemia: Secondary | ICD-10-CM

## 2015-07-06 DIAGNOSIS — F1424 Cocaine dependence with cocaine-induced mood disorder: Secondary | ICD-10-CM | POA: Diagnosis present

## 2015-07-06 DIAGNOSIS — F1494 Cocaine use, unspecified with cocaine-induced mood disorder: Secondary | ICD-10-CM | POA: Diagnosis present

## 2015-07-06 DIAGNOSIS — Z801 Family history of malignant neoplasm of trachea, bronchus and lung: Secondary | ICD-10-CM | POA: Diagnosis not present

## 2015-07-06 DIAGNOSIS — Z79899 Other long term (current) drug therapy: Secondary | ICD-10-CM | POA: Diagnosis not present

## 2015-07-06 DIAGNOSIS — I10 Essential (primary) hypertension: Secondary | ICD-10-CM | POA: Diagnosis present

## 2015-07-06 DIAGNOSIS — R739 Hyperglycemia, unspecified: Secondary | ICD-10-CM

## 2015-07-06 DIAGNOSIS — F332 Major depressive disorder, recurrent severe without psychotic features: Principal | ICD-10-CM | POA: Diagnosis present

## 2015-07-06 DIAGNOSIS — K219 Gastro-esophageal reflux disease without esophagitis: Secondary | ICD-10-CM | POA: Diagnosis present

## 2015-07-06 DIAGNOSIS — E119 Type 2 diabetes mellitus without complications: Secondary | ICD-10-CM | POA: Diagnosis present

## 2015-07-06 DIAGNOSIS — E7800 Pure hypercholesterolemia, unspecified: Secondary | ICD-10-CM | POA: Diagnosis present

## 2015-07-06 DIAGNOSIS — F1410 Cocaine abuse, uncomplicated: Secondary | ICD-10-CM | POA: Insufficient documentation

## 2015-07-06 DIAGNOSIS — Z7984 Long term (current) use of oral hypoglycemic drugs: Secondary | ICD-10-CM | POA: Insufficient documentation

## 2015-07-06 DIAGNOSIS — Z791 Long term (current) use of non-steroidal anti-inflammatories (NSAID): Secondary | ICD-10-CM | POA: Insufficient documentation

## 2015-07-06 DIAGNOSIS — F410 Panic disorder [episodic paroxysmal anxiety] without agoraphobia: Secondary | ICD-10-CM | POA: Diagnosis present

## 2015-07-06 LAB — CBC WITH DIFFERENTIAL/PLATELET
BASOS PCT: 0 %
Basophils Absolute: 0 10*3/uL (ref 0.0–0.1)
Eosinophils Absolute: 0.2 10*3/uL (ref 0.0–0.7)
Eosinophils Relative: 2 %
HCT: 40.1 % (ref 39.0–52.0)
HEMOGLOBIN: 13.7 g/dL (ref 13.0–17.0)
LYMPHS ABS: 1.6 10*3/uL (ref 0.7–4.0)
Lymphocytes Relative: 22 %
MCH: 30 pg (ref 26.0–34.0)
MCHC: 34.2 g/dL (ref 30.0–36.0)
MCV: 87.7 fL (ref 78.0–100.0)
MONOS PCT: 11 %
Monocytes Absolute: 0.8 10*3/uL (ref 0.1–1.0)
NEUTROS ABS: 4.6 10*3/uL (ref 1.7–7.7)
NEUTROS PCT: 65 %
PLATELETS: 243 10*3/uL (ref 150–400)
RBC: 4.57 MIL/uL (ref 4.22–5.81)
RDW: 13.2 % (ref 11.5–15.5)
WBC: 7.1 10*3/uL (ref 4.0–10.5)

## 2015-07-06 LAB — COMPREHENSIVE METABOLIC PANEL
ALBUMIN: 4 g/dL (ref 3.5–5.0)
ALT: 41 U/L (ref 17–63)
ANION GAP: 9 (ref 5–15)
AST: 22 U/L (ref 15–41)
Alkaline Phosphatase: 67 U/L (ref 38–126)
BUN: 25 mg/dL — ABNORMAL HIGH (ref 6–20)
CO2: 25 mmol/L (ref 22–32)
Calcium: 8.7 mg/dL — ABNORMAL LOW (ref 8.9–10.3)
Chloride: 108 mmol/L (ref 101–111)
Creatinine, Ser: 0.8 mg/dL (ref 0.61–1.24)
GFR calc Af Amer: >60 mL/min (ref >60)
GFR calc non Af Amer: >60 mL/min (ref >60)
GLUCOSE: 197 mg/dL — AB (ref 65–99)
POTASSIUM: 4 mmol/L (ref 3.5–5.1)
SODIUM: 142 mmol/L (ref 135–145)
TOTAL PROTEIN: 6.9 g/dL (ref 6.5–8.1)
Total Bilirubin: 0.3 mg/dL (ref 0.3–1.2)

## 2015-07-06 LAB — RAPID URINE DRUG SCREEN, HOSP PERFORMED
Amphetamines: NOT DETECTED
BENZODIAZEPINES: NOT DETECTED
Barbiturates: NOT DETECTED
Cocaine: POSITIVE — AB
OPIATES: NOT DETECTED
Tetrahydrocannabinol: NOT DETECTED

## 2015-07-06 LAB — ETHANOL: Alcohol, Ethyl (B): <5 mg/dL (ref <5)

## 2015-07-06 MED ORDER — TRAZODONE HCL 50 MG PO TABS: 150.0000 mg | ORAL_TABLET | Freq: Every day | ORAL | Status: DC

## 2015-07-06 MED ORDER — TRAZODONE HCL 150 MG PO TABS
150.0000 mg | ORAL_TABLET | Freq: Every day | ORAL | Status: DC
  Administered 2015-07-06 – 2015-07-09 (×4): 150 mg via ORAL
  Filled 2015-07-06 (×7): qty 1

## 2015-07-06 MED ORDER — PRAVASTATIN SODIUM 10 MG PO TABS
20.0000 mg | ORAL_TABLET | Freq: Every day | ORAL | Status: DC
  Administered 2015-07-06: 20 mg via ORAL
  Filled 2015-07-06 (×2): qty 2

## 2015-07-06 MED ORDER — LORAZEPAM 1 MG PO TABS
1.0000 mg | ORAL_TABLET | Freq: Three times a day (TID) | ORAL | Status: DC | PRN
  Administered 2015-07-06: 1 mg via ORAL
  Filled 2015-07-06: qty 1

## 2015-07-06 MED ORDER — LISINOPRIL 10 MG PO TABS: 20.0000 mg | ORAL_TABLET | Freq: Every day | ORAL | Status: DC

## 2015-07-06 MED ORDER — ALUM & MAG HYDROXIDE-SIMETH 200-200-20 MG/5ML PO SUSP: 30.0000 mL | ORAL | Status: DC | PRN

## 2015-07-06 MED ORDER — HYDROXYZINE HCL 50 MG PO TABS
50.0000 mg | ORAL_TABLET | Freq: Every evening | ORAL | Status: DC | PRN
  Administered 2015-07-06 – 2015-07-15 (×9): 50 mg via ORAL
  Filled 2015-07-06 (×12): qty 1

## 2015-07-06 MED ORDER — MAGNESIUM HYDROXIDE 400 MG/5ML PO SUSP: 30.0000 mL | Freq: Every day | ORAL | Status: DC | PRN

## 2015-07-06 MED ORDER — ACETAMINOPHEN 325 MG PO TABS
650.0000 mg | ORAL_TABLET | Freq: Four times a day (QID) | ORAL | Status: DC | PRN
  Administered 2015-07-07 – 2015-07-21 (×12): 650 mg via ORAL
  Filled 2015-07-06 (×12): qty 2

## 2015-07-06 MED ORDER — METFORMIN HCL ER 500 MG PO TB24
500.0000 mg | ORAL_TABLET | Freq: Every day | ORAL | Status: DC
  Filled 2015-07-06 (×3): qty 1

## 2015-07-06 MED ORDER — ACETAMINOPHEN 500 MG PO TABS: 1000.0000 mg | ORAL_TABLET | Freq: Four times a day (QID) | ORAL | Status: DC | PRN

## 2015-07-06 MED ORDER — METOPROLOL TARTRATE 50 MG PO TABS: 50.0000 mg | ORAL_TABLET | Freq: Two times a day (BID) | ORAL | Status: DC

## 2015-07-06 MED ORDER — ACETAMINOPHEN 325 MG PO TABS: 650.0000 mg | ORAL_TABLET | ORAL | Status: DC | PRN

## 2015-07-06 MED ORDER — TRAZODONE HCL 50 MG PO TABS
50.0000 mg | ORAL_TABLET | Freq: Every evening | ORAL | Status: DC | PRN
  Filled 2015-07-06: qty 1

## 2015-07-06 MED ORDER — IBUPROFEN 400 MG PO TABS: 600.0000 mg | ORAL_TABLET | Freq: Three times a day (TID) | ORAL | Status: DC | PRN

## 2015-07-06 NOTE — BH Assessment (Addendum)
Tele Assessment Note   Mark Acosta is an 45 y.o. male  who presents   reporting symptoms of depression and suicidal ideation. Pt has a history of bipolar and has recent stressors of his mom dying 2 years ago and his dad currently dying.  He says he has been increasingly depressed and not sleeping for the past 2 months, and "I pray to god to take me, I am tired of myself". He states that this weekend he relapsed after 14 months sobriety and used 1/2 gram of cocaine in an attempt for his "heart to blow up". He has also thought about slitting his wrist or hanging himself. In 2016, he was about to hang himself with a rope near a tree, when his mom dies, but his father talked him out of it and he want to [facility].  Pt reports stopping medication a couple of months ago.  He states he was on "[facility]" from [facility], and the MD at mental health took him off and put him on Trazadone for sleep, which did not work, so he stopped taking it. He hasn't gone back to Op treatment for 6 mo.  Pt acknowledges symptoms including crying spells, social withdrawal, loss of interest in usual pleasures, decreased concentration, fatigue, irritability, decreased sleep, decreased appetite and feelings of hopelessness. PT denies homicidal ideation or history of violence. Pt admits to mild auditory and visual hallucinations "hearing bumps and seeing shadows". Pt denies alcohol or substance abuse.   Pt lives with a roommate who drinks, and had lived for 10 months in a halfway house for [facility]) and was doing well at that time. His supports include his family, but he states, "They have their own lives". Pt denies history of abuse and trauma. 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 Pt has fair insight and judgement.   Pt is casually dressed, alert, oriented x4 with normal speech and normal motor behavior. Eye contact is good.  Pt's mood is depressed and affect is depressed and  tearful. Affect is congruent with mood. Thought process is coherent and relevant. There is no indication Pt is currently responding to internal stimuli or experiencing delusional thought content. Pt was cooperative throughout assessment. Pt is currently unable to contract for safety outside the hospital and wants inpatient psychiatric treatment. Mark Means, DNP recommends [facility] treatment, TTS will seek placement.    Diagnosis: Bipolar Disorder  Past Medical History:  Past Medical History  Diagnosis Date  . Gout   . Diverticula of colon   . Substance abuse     hx etoh, marijuana, cocaine. quit 02/2014  . Diabetes mellitus without complication (HCC)     boarderline    Past Surgical History  Procedure Laterality Date  . None to date      As of 05/11/15  . Colonoscopy with propofol N/A 06/04/2015    Procedure: COLONOSCOPY WITH PROPOFOL;  Surgeon: Corbin Ade, MD;  Location: [facility];  Service: Endoscopy;  Laterality: N/A;  0845  . Esophagogastroduodenoscopy (egd) with propofol N/A 06/04/2015    Procedure: ESOPHAGOGASTRODUODENOSCOPY (EGD) WITH PROPOFOL;  Surgeon: Corbin Ade, MD;  Location: [facility];  Service: Endoscopy;  Laterality: N/A;    Family History:  Family History  Problem Relation Age of Onset  . Colon cancer Neg Hx     Social History:  reports that he has never smoked. He has never used smokeless tobacco. He reports that he uses illicit drugs (  Cocaine and Marijuana). He reports that he does not drink alcohol.  Additional Social History:  Alcohol / Drug Use Pain Medications: denies currently Prescriptions: denies currently Over the Counter: denies currently History of alcohol / drug use?: Yes Longest period of sobriety (when/how long): 14 months Negative Consequences of Use: Financial, Personal relationships Withdrawal Symptoms:  (denies currently) Substance #1 Name of Substance 1: cocaine 1 - Age of First Use: unk 1 - Amount (size/oz): 1/2 oz 1 -  Frequency: 1 time use this weekend 1 - Duration: 1 time 1 - Last Use / Amount: this weekend  CIWA: CIWA-Ar BP: 134/72 mmHg Pulse Rate: 92 COWS:    PATIENT STRENGTHS: (choose at least two) Ability for insight Average or above average intelligence Capable of independent living Communication skills Work skills  Allergies: No Known Allergies  Home Medications:  (Not in a hospital admission)  OB/GYN Status:  No LMP for male patient.  General Assessment Data Location of Assessment: [facility] TTS Assessment: In system Is this a Tele or Face-to-Face Assessment?: Tele Assessment Is this an Initial Assessment or a Re-assessment for this encounter?: Initial Assessment Marital status: Single Living Arrangements:  (roommate) Can pt return to current living arrangement?: Yes Admission Status: Voluntary Is patient capable of signing voluntary admission?: Yes Referral Source: Self/Family/Friend Insurance type: SP     Crisis Care Plan Living Arrangements:  (roommate) Name of Psychiatrist: none Name of Therapist: none  Education Status Is patient currently in school?: No  Risk to self with the past 6 months Suicidal Ideation: Yes-Currently Present Has patient been a risk to self within the past 6 months prior to admission? : Yes Suicidal Intent: Yes-Currently Present Has patient had any suicidal intent within the past 6 months prior to admission? : Yes Is patient at risk for suicide?: Yes Suicidal Plan?: Yes-Currently Present Has patient had any suicidal plan within the past 6 months prior to admission? : Yes Specify Current Suicidal Plan: overdose on cocaine, hang self, slit wrists Access to Acosta: Yes Specify Access to Suicidal Acosta: environment What has been your use of drugs/alcohol within the last 12 months?: cocaine last weekend Previous Attempts/Gestures: Yes How many times?: 1 Other Self Harm Risks:  (none known) Triggers for Past Attempts:  (depression) Intentional  Self Injurious Behavior: None Family Suicide History: Unknown Recent stressful life event(s): Loss (Comment), Financial Problems (mom died, dad dying) Persecutory voices/beliefs?: Yes Depression: Yes Depression Symptoms: Despondent, Insomnia, Tearfulness, Isolating, Fatigue, Guilt, Loss of interest in usual pleasures, Feeling worthless/self pity, Feeling angry/irritable Substance abuse history and/or treatment for substance abuse?: Yes Suicide prevention information given to non-admitted patients: Not applicable  Risk to Others within the past 6 months Homicidal Ideation: No Does patient have any lifetime risk of violence toward others beyond the six months prior to admission? : No Thoughts of Harm to Others: No Current Homicidal Intent: No Current Homicidal Plan: No Access to Homicidal Acosta: No History of harm to others?: No Assessment of Violence: None Noted Does patient have access to weapons?: Yes (Comment) ("anything can be used as a weapon") Criminal Charges Pending?: No Does patient have a court date: No Is patient on probation?: No  Psychosis Hallucinations: Auditory, Visual (bumps, shadows) Delusions: None noted  Mental Status Report Appearance/Hygiene: Disheveled Eye Contact: Good Motor Activity: Unremarkable Speech: Logical/coherent Level of Consciousness: Alert Mood: Depressed, Sad Affect: Depressed, Sad Anxiety Level: Severe Thought Processes: Coherent, Relevant Judgement: Partial Orientation: Person, Place, Time, Situation, Appropriate for developmental age Obsessive Compulsive Thoughts/Behaviors: Moderate  Cognitive Functioning Concentration: Poor Memory: Recent Intact, Remote Intact IQ: Average Insight: Fair Impulse Control: Fair Appetite: Fair Weight Loss: 0 Weight Gain: 0 Sleep: Decreased Total Hours of Sleep:  ("very little") Vegetative Symptoms: Decreased grooming, Staying in bed  ADLScreening [facility] Assessment Services) Patient's cognitive  ability adequate to safely complete daily activities?: Yes Patient able to express need for assistance with ADLs?: Yes Independently performs ADLs?: Yes (appropriate for developmental age)  Prior Inpatient Therapy Prior Inpatient Therapy: Yes Prior Therapy Dates: 2016 Prior Therapy Facilty/Provider(s): [facility] Reason for Treatment: depression  Prior Outpatient Therapy Prior Outpatient Therapy: Yes Prior Therapy Dates: unk Prior Therapy Facilty/Provider(s): [facility] Reason for Treatment: depression Does patient have an ACCT team?: No Does patient have Intensive In-House Services?  : No Does patient have [facility] services? : No Does patient have P4CC services?: No  ADL Screening (condition at time of admission) Patient's cognitive ability adequate to safely complete daily activities?: Yes Is the patient deaf or have difficulty hearing?: No Does the patient have difficulty seeing, even when wearing glasses/contacts?: No Does the patient have difficulty concentrating, remembering, or making decisions?: Yes Patient able to express need for assistance with ADLs?: Yes Does the patient have difficulty dressing or bathing?: No Independently performs ADLs?: Yes (appropriate for developmental age) Does the patient have difficulty walking or climbing stairs?: No Weakness of Legs: None Weakness of Arms/Hands: None  Home Assistive Devices/Equipment Home Assistive Devices/Equipment: None    Abuse/Neglect Assessment (Assessment to be complete while patient is alone) Physical Abuse: Denies Verbal Abuse: Denies Sexual Abuse: Denies Exploitation of patient/patient's resources: Denies Self-Neglect: Denies Values / Beliefs Cultural Requests During Hospitalization: None Spiritual Requests During Hospitalization: None   Advance Directives (For Healthcare) Does patient have an advance directive?: No    Additional Information 1:1 In Past 12 Months?: No CIRT Risk: No Elopement Risk:  No Does patient have medical clearance?: Yes     Disposition:  Disposition Initial Assessment Completed for this Encounter: Yes Disposition of Patient: Inpatient treatment program Type of inpatient treatment program: Adult  [facility],Maurica Omura Hines 07/06/2015 1:12 PM

## 2015-07-06 NOTE — Progress Notes (Signed)
Mark Acosta is a 45 year old male being admitted voluntarily to 300-[facility] from [facility].  He came to the ED reporting symptoms of depression and suicidal ideation.  He reports history of bipolar disorder.  His current stressor is that his father is dying and his mother passed away two years ago.  He admits to poor sleep and increasing depression.  He prays that the lord will take him.  He relapsed after 14 months of sobriety over the weekend.  He was hoping that his heart would explode after using crack cocaine.  He has history of psychiatric hospitalization after attempting to hang self and father talked him out of it.  He hasn't being following up with outpatient treatment.  He admits to seeing shadows and hearing noises.  He states that he still has SI thoughts but no plan at this time.  He is able to contract for safety on the unit.  Admission paperwork completed and signed.  Belongings searched and secured in locker # 27 (shoes with strings, cell phones, keys, knife, wallet with NCDL, hat, wooden cross and duffle bag-in room behind the curtain).  Skin assessment completed and noted right upper leg and right lower leg well healed scar.  Q 15 minute checks initiated for safety.  We will monitor the progress towards his goals.

## 2015-07-06 NOTE — Tx Team (Addendum)
Initial Interdisciplinary Treatment Plan   PATIENT STRESSORS: Financial difficulties Marital or family conflict Medication change or noncompliance   PATIENT STRENGTHS: Average or above average intelligence Communication skills Work skills   PROBLEM LIST: Problem List/Patient Goals Date to be addressed Date deferred Reason deferred Estimated date of resolution  Depression 07/06/15     Suicidal ideation 07/06/15     Psychosis 07/06/15     "Have a better understanding of what I am going through" 07/06/15     "Find medication that will help" 07/06/15                              DISCHARGE CRITERIA:  Motivation to continue treatment in a less acute level of care Verbal commitment to aftercare and medication compliance  PRELIMINARY DISCHARGE PLAN: Outpatient therapy Medication management  PATIENT/FAMIILY INVOLVEMENT: This treatment plan has been presented to and reviewed with the patient, Caprice KluverCurtis Peary.  The patient and family have been given the opportunity to ask questions and make suggestions.  Norm ParcelHeather V Branae Crail 07/06/2015, 11:20 PM

## 2015-07-06 NOTE — ED Provider Notes (Signed)
CSN: [id number]     Arrival date & time 07/06/15  1044 History   First MD Initiated Contact with Patient 07/06/15 1210     Chief Complaint  Patient presents with  . V70.1    HPI Patient presents to the emergency room with complaints of depression and suicidal ideation. Patient has a history of cocaine abuse. Over the last 2 weeks he has been feeling depressed and suicidal. He started using cocaine again on Saturday. Patient's had a lot of family stressors with his father's illness. He has been having a lot of nightmares.  He denies having auditory command hallucinations but states he often thinks he is hearing things that started on him. He has been thinking about cutting his wrists. Denies any other trouble chest pain shortness of breath. He feels like he needs help with his mental health problems Past Medical History  Diagnosis Date  . Gout   . Diverticula of colon   . Substance abuse     hx etoh, marijuana, cocaine. quit 02/2014  . 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 None to date      As of 05/11/15  . Colonoscopy with propofol N/A 06/04/2015    Procedure: COLONOSCOPY WITH PROPOFOL;  Surgeon: Corbin Ade, MD;  Location: [facility];  Service: Endoscopy;  Laterality: N/A;  0845  . Esophagogastroduodenoscopy (egd) with propofol N/A 06/04/2015    Procedure: ESOPHAGOGASTRODUODENOSCOPY (EGD) WITH PROPOFOL;  Surgeon: Corbin Ade, MD;  Location: [facility];  Service: Endoscopy;  Laterality: N/A;   Family History  Problem Relation Age of Onset  . Colon cancer Neg Hx    Social History  Substance Use Topics  . Smoking status: Never Smoker   . Smokeless tobacco: Never Used  . Alcohol Use: No     Comment: hx alcoholism. Now only a drink on special occasions (2-3 times a year)    Review of Systems  All other systems reviewed and are negative.     Allergies  Review of patient's allergies indicates no  known allergies.  Home Medications   Prior to Admission medications   Medication Sig Start Date End Date Taking? Authorizing Provider  acetaminophen (TYLENOL) 500 MG tablet Take 1,000 mg by mouth every 6 (six) hours as needed for mild pain.     Historical Provider, MD  cyclobenzaprine (FLEXERIL) 10 MG tablet Take 1 tablet (10 mg total) by mouth 3 (three) times daily as needed. Patient taking differently: Take 10 mg by mouth 3 (three) times daily as needed for muscle spasms.  06/03/15   Tammy Triplett, PA-C  diazepam (VALIUM) 5 MG tablet Take 1 tablet (5 mg total) by mouth 2 (two) times daily. 06/07/15   Elson Areas, PA-C  diclofenac (VOLTAREN) 50 MG EC tablet Take 1 tablet (50 mg total) by mouth 2 (two) times daily. 06/07/15   Elson Areas, PA-C  gabapentin (NEURONTIN) 400 MG capsule Take 400 mg by mouth as needed (neuropathay).     Historical Provider, MD  ibuprofen (ADVIL,MOTRIN) 600 MG tablet Take 1 tablet (600 mg total) by mouth every 8 (eight) hours as needed. Patient taking differently: Take 600 mg by mouth every 8 (eight) hours as needed for moderate pain.  09/15/14   Azalia Bilis, MD  lisinopril (PRINIVIL,ZESTRIL) 20 MG tablet TAKE 1 Tablet BY MOUTH ONCE DAILY 05/18/15   Jacquelin Hawking, PA-C  lovastatin (MEVACOR) 20 MG tablet Take  1 tablet (20 mg total) by mouth at bedtime. 02/17/15   Jacquelin HawkingShannon McElroy, PA-C  metFORMIN (GLUCOPHAGE-XR) 500 MG 24 hr tablet TAKE 1 Tablet BY MOUTH ONCE DAILY 05/18/15   Jacquelin HawkingShannon McElroy, PA-C  metoprolol (LOPRESSOR) 50 MG tablet Take 1 tablet (50 mg total) by mouth 2 (two) times daily. 01/20/15   Jacquelin HawkingShannon McElroy, PA-C  Omega-3 Fatty Acids (FISH OIL PO) Take 1 tablet by mouth 2 (two) times daily. Reported on 06/03/2015    Historical Provider, MD  oxyCODONE-acetaminophen (PERCOCET) 7.5-325 MG tablet Take 1 tablet by mouth every 4 (four) hours as needed for severe pain. Patient not taking: Reported on 06/07/2015 06/03/15   Tammy Triplett, PA-C  predniSONE (DELTASONE) 10 MG  tablet Take 6 tablets day one, 5 tablets day two, 4 tablets day three, 3 tablets day four, 2 tablets day five, then 1 tablet day six Patient taking differently: Take 10-60 mg by mouth daily. Take 6 tablets day one, 5 tablets day two, 4 tablets day three, 3 tablets day four, 2 tablets day five, then 1 tablet day six 06/03/15   Tammy Triplett, PA-C  traZODone (DESYREL) 150 MG tablet Take 150 mg by mouth at bedtime.    Historical Provider, MD   BP 134/72 mmHg  Pulse 92  Temp(Src) 98.3 F (36.8 C)  Resp 18  Ht 5\' 6"  (1.676 m)  Wt 127.007 kg  BMI 45.21 kg/m2  SpO2 98% Physical Exam  Constitutional: He appears well-developed and well-nourished. No distress.  HENT:  Head: Normocephalic and atraumatic.  Right Ear: External ear normal.  Left Ear: External ear normal.  Eyes: Conjunctivae are normal. Right eye exhibits no discharge. Left eye exhibits no discharge. No scleral icterus.  Neck: Neck supple. No tracheal deviation present.  Cardiovascular: Normal rate, regular rhythm and intact distal pulses.   Pulmonary/Chest: Effort normal and breath sounds normal. No stridor. No respiratory distress. He has no wheezes. He has no rales.  Abdominal: Soft. Bowel sounds are normal. He exhibits no distension. There is no tenderness. There is no rebound and no guarding.  Musculoskeletal: He exhibits no edema or tenderness.  Neurological: He is alert. He has normal strength. No cranial nerve deficit (no facial droop, extraocular movements intact, no slurred speech) or sensory deficit. He exhibits normal muscle tone. He displays no seizure activity. Coordination normal.  Skin: Skin is warm and dry. No rash noted.  Psychiatric: He has a normal mood and affect.  Nursing note and vitals reviewed.   ED Course  Procedures (including critical care time) Labs Review Labs Reviewed  COMPREHENSIVE METABOLIC PANEL - Abnormal; Notable for the following:    Glucose, Bld 197 (*)    BUN 25 (*)    Calcium 8.7 (*)     All other components within normal limits  URINE RAPID DRUG SCREEN, HOSP PERFORMED - Abnormal; Notable for the following:    Cocaine POSITIVE (*)    All other components within normal limits  ETHANOL  CBC WITH DIFFERENTIAL/PLATELET   I have personally reviewed and evaluated these images and lab results as part of my medical decision-making.   EKG Interpretation   Date/Time:  Monday July 06 2015 10:56:48 EDT Ventricular Rate:  90 PR Interval:  134 QRS Duration: 94 QT Interval:  366 QTC Calculation: 447 R Axis:   89 Text Interpretation:  Normal sinus rhythm Normal ECG Interpretation  limited secondary to artifact Confirmed by Manus GunningANCOUR  MD, STEPHEN ([id number]) on  07/06/2015 12:08:31 PM      MDM  Final diagnoses:  Cocaine abuse  Suicidal ideation  Hyperglycemia    Pt is medically stable.  Labs notable for cocaine in his urine which the patient has admitted to using recently and hyperglycemia.  He has history of borderline blood sugars.  No DKA and patient can follow up with a PCP routinely about that.  May benefit from diet modification.  Will consult with psychiatry/TTS.    Linwood Dibbles, MD 07/06/15 1308

## 2015-07-06 NOTE — Progress Notes (Signed)
[facility] to Dr. Dub MikesLugo, room 300-2, pt to arrive at 20:00. Call report at [phone]. [facility] RN Darel HongJudy was informed.  [place] Abtsatia Rane Blitch, LCSWA Disposition staff 07/06/2015 3:40 PM

## 2015-07-06 NOTE — ED Notes (Signed)
Pt states he has been feeling suicidal x 2 weeks. States he relapsed and used cocaine on Saturday. Pt reports his father is dying. Pt reports visual and auditory hallucinations and nightmares. Pt reports plan to cut his wrists. Denies HI.

## 2015-07-07 ENCOUNTER — Encounter (HOSPITAL_COMMUNITY): Payer: Self-pay

## 2015-07-07 DIAGNOSIS — F1494 Cocaine use, unspecified with cocaine-induced mood disorder: Secondary | ICD-10-CM

## 2015-07-07 DIAGNOSIS — F332 Major depressive disorder, recurrent severe without psychotic features: Secondary | ICD-10-CM | POA: Diagnosis present

## 2015-07-07 DIAGNOSIS — R45851 Suicidal ideations: Secondary | ICD-10-CM

## 2015-07-07 MED ORDER — HYDROXYZINE HCL 50 MG PO TABS
50.0000 mg | ORAL_TABLET | Freq: Four times a day (QID) | ORAL | Status: DC | PRN
  Administered 2015-07-07 – 2015-07-10 (×5): 50 mg via ORAL
  Filled 2015-07-07 (×2): qty 1

## 2015-07-07 NOTE — Progress Notes (Signed)
D:  Patient's self inventory sheet, patient has fair sleep, sleep medication is helpful.  Good appetite, low energy level, poor concentration.  Rated depression and hopeless 8, anxiety 10.  Denied withdrawals.  SI, contracts for safety.  Physical problem, headaches.  Pain back, worst pain in past 24 hours is #7.  Goal is to figure things out.  Plans to talk to MD.  No discharge plans. A:  Medications administered per MD orders.  Emotional support and encouragement given patient. R:  Denied SI while talking to nurse, contracts for safety.   Denied HI.  Denied A/V hallucinations.  Safety maintained with 15 minute checks.

## 2015-07-07 NOTE — H&P (Signed)
Psychiatric Admission Assessment Adult  Patient Identification: Mark Acosta MRN:  [id number] Date of Evaluation:  07/07/2015 Chief Complaint:  Bipolar Disorder Principal Diagnosis: <principal problem not specified> Diagnosis:   Patient Active Problem List   Diagnosis Date Noted  . Cocaine-induced mood disorder ([place]) [F14.94] 07/06/2015  . Reflux esophagitis [K21.0]   . Diverticulosis of large intestine without hemorrhage [K57.30]   . Hematochezia [K92.1]   . Heme + stool [R19.5] 05/11/2015  . Rectal bleeding [K62.5] 05/11/2015  . Morbid obesity ([place]) [E66.01] 01/24/2015  . Type II diabetes mellitus, uncontrolled ([place]) [E11.65] 01/20/2015  . Essential hypertension, benign [I10] 01/20/2015  . Hyperlipemia [E78.5] 01/20/2015   History of Present Illness:: 45 Y/O male who states a year ago he lost his mother. He got very depressed suicidal. He went to [facility].  Now recently his father was diagnosed with lung cancer. He got very depressed and the depression got worst when his father had a bad reaction to chemotherapy. States he is adopted. He met his biological family but does not know anything about their medical history. He relapsed this weekend on cocaine. States he was trying to kill himself by overdosing on cocaine.    Mark Acosta is an 45 y.o. male who presents reporting symptoms of depression and suicidal ideation. Pt has a history of bipolar and has recent stressors of his mom dying 2 years ago and his dad currently dying. He says he has been increasingly depressed and not sleeping for the past 2 months, and "I pray to god to take me, I am tired of myself". He states that this weekend he relapsed after 14 months sobriety and used 1/2 gram of cocaine in an attempt for his "heart to blow up". He has also thought about slitting his wrist or hanging himself. In 05-09-14, he was about to hang himself with a rope near a tree, when his mom die, but his father talked him out of it and he want  to [facility] help. Pt reports stopping medication a couple of months ago. He states he was on "[facility] good" from [facility], and the MD at mental health took him off and put him on Trazadone for sleep, which did not work, so he stopped taking it. He hasn't gone back to Op treatment for 6 mo.  Pt acknowledges symptoms including crying spells, social withdrawal, loss of interest in usual pleasures, decreased concentration, fatigue, irritability, decreased sleep, decreased appetite and feelings of hopelessness. PT denies homicidal ideation or history of violence. Pt admits to mild auditory and visual hallucinations "hearing bumps and seeing shadows". Pt denies alcohol or substance abuse.   Associated Signs/Symptoms: Depression Symptoms:  depressed mood, anhedonia, insomnia, fatigue, difficulty concentrating, suicidal thoughts with specific plan, anxiety, panic attacks, loss of energy/fatigue, disturbed sleep, (Hypo) Manic Symptoms:  Labiality of Mood, Anxiety Symptoms:  Excessive Worry, Panic Symptoms, since mother died 05/09/12 Psychotic Symptoms:  Hallucinations: Auditory Visual PTSD Symptoms: Had a traumatic exposure:  saw people died had to give CPR  Re-experiencing:  Flashbacks Intrusive Thoughts Nightmares Total Time spent with patient: 45 minutes  Past Psychiatric History:   Is the patient at risk to self? Yes.    Has the patient been a risk to self in the past 6 months? Yes.    Has the patient been a risk to self within the distant past? Yes.    Is the patient a risk to others? No.  Has the patient been a risk to others in the past 6  months? No.  Has the patient been a risk to others within the distant past? No.   Prior Inpatient Therapy:  [facility] twice 2016 end of January end of 2015 states he was prescribed 5-6 different medications. Went to [facility] several times hearing voices seeing things did huffing carburator fluid ACID LSD mushrooms Prior Outpatient Therapy:   [facility] Dr. Hoyle Barr took him off medications that was given at [facility]  Alcohol Screening: 1. How often do you have a drink containing alcohol?: Never 9. Have you or someone else been injured as a result of your drinking?: No 10. Has a relative or friend or a doctor or another health worker been concerned about your drinking or suggested you cut down?: No Alcohol Use Disorder Identification Test Final Score (AUDIT): 0 Brief Intervention: AUDIT score less than 7 or less-screening does not suggest unhealthy drinking-brief intervention not indicated Substance Abuse History in the last 12 months:  Yes.   Consequences of Substance Abuse: Negative Previous Psychotropic Medications: Yes  Psychological Evaluations: No  Past Medical History:  Past Medical History  Diagnosis Date  . Gout   . Diverticula of colon   . Substance abuse     hx etoh, marijuana, cocaine. quit 02/2014  . Diabetes mellitus without complication ([place])     boarderline    Past Surgical History  Procedure Laterality Date  . None to date      As of 05/11/15  . Colonoscopy with propofol N/A 06/04/2015    Procedure: COLONOSCOPY WITH PROPOFOL;  Surgeon: Daneil Dolin, MD;  Location: [facility];  Service: Endoscopy;  Laterality: N/A;  0845  . Esophagogastroduodenoscopy (egd) with propofol N/A 06/04/2015    Procedure: ESOPHAGOGASTRODUODENOSCOPY (EGD) WITH PROPOFOL;  Surgeon: Daneil Dolin, MD;  Location: [facility];  Service: Endoscopy;  Laterality: N/A;   Family History:  Family History  Problem Relation Age of Onset  . Colon cancer Neg Hx   Adopted  Family Psychiatric  History: adopted has a twin sister with physical problems Tobacco Screening: _0 ([phone])::1)@ Social History:  History  Alcohol Use No    Comment: hx alcoholism. Now only a drink on special occasions (2-3 times a year)     History  Drug Use  . Yes  . Special: Cocaine, Marijuana   [employer] few years. Graduated HS, single  no children. Did couple of years in prison. Nothing in years. Additional Social History:      Pain Medications: denies currently Prescriptions: denies currently Over the Counter: denies currently History of alcohol / drug use?: Yes Longest period of sobriety (when/how long): 14 months Negative Consequences of Use: Financial, Personal relationships Withdrawal Symptoms: Other (Comment) (None reported) Name of Substance 1: cocaine 1 - Age of First Use: unk 1 - Amount (size/oz): 1/2 oz 1 - Duration: 1 time 1 - Last Use / Amount: past weekend                  Allergies:  No Known Allergies Lab Results:  Results for orders placed or performed during the hospital encounter of 07/06/15 (from the past 48 hour(s))  Comprehensive metabolic panel     Status: Abnormal   Collection Time: 07/06/15 11:22 AM  Result Value Ref Range   Sodium 142 135 - 145 mmol/L   Potassium 4.0 3.5 - 5.1 mmol/L   Chloride 108 101 - 111 mmol/L   CO2 25 22 - 32 mmol/L   Glucose, Bld 197 (H) 65 - 99 mg/dL  BUN 25 (H) 6 - 20 mg/dL   Creatinine, Ser 0.80 0.61 - 1.24 mg/dL   Calcium 8.7 (L) 8.9 - 10.3 mg/dL   Total Protein 6.9 6.5 - 8.1 g/dL   Albumin 4.0 3.5 - 5.0 g/dL   AST 22 15 - 41 U/L   ALT 41 17 - 63 U/L   Alkaline Phosphatase 67 38 - 126 U/L   Total Bilirubin 0.3 0.3 - 1.2 mg/dL   GFR calc non Af Amer >60 >60 mL/min   GFR calc Af Amer >60 >60 mL/min    Comment: (NOTE) The eGFR has been calculated using the CKD EPI equation. This calculation has not been validated in all clinical situations. eGFR's persistently <60 mL/min signify possible Chronic Kidney Disease.    Anion gap 9 5 - 15  Ethanol     Status: None   Collection Time: 07/06/15 11:22 AM  Result Value Ref Range   Alcohol, Ethyl (B) <5 <5 mg/dL    Comment:        LOWEST DETECTABLE LIMIT FOR SERUM ALCOHOL IS 5 mg/dL FOR MEDICAL PURPOSES ONLY   CBC with Diff     Status: None   Collection Time: 07/06/15 11:22 AM  Result Value Ref  Range   WBC 7.1 4.0 - 10.5 K/uL   RBC 4.57 4.22 - 5.81 MIL/uL   Hemoglobin 13.7 13.0 - 17.0 g/dL   HCT 40.1 39.0 - 52.0 %   MCV 87.7 78.0 - 100.0 fL   MCH 30.0 26.0 - 34.0 pg   MCHC 34.2 30.0 - 36.0 g/dL   RDW 13.2 11.5 - 15.5 %   Platelets 243 150 - 400 K/uL   Neutrophils Relative % 65 %   Neutro Abs 4.6 1.7 - 7.7 K/uL   Lymphocytes Relative 22 %   Lymphs Abs 1.6 0.7 - 4.0 K/uL   Monocytes Relative 11 %   Monocytes Absolute 0.8 0.1 - 1.0 K/uL   Eosinophils Relative 2 %   Eosinophils Absolute 0.2 0.0 - 0.7 K/uL   Basophils Relative 0 %   Basophils Absolute 0.0 0.0 - 0.1 K/uL  Urine rapid drug screen (hosp performed)not at [facility]     Status: Abnormal   Collection Time: 07/06/15 12:24 PM  Result Value Ref Range   Opiates NONE DETECTED NONE DETECTED   Cocaine POSITIVE (A) NONE DETECTED   Benzodiazepines NONE DETECTED NONE DETECTED   Amphetamines NONE DETECTED NONE DETECTED   Tetrahydrocannabinol NONE DETECTED NONE DETECTED   Barbiturates NONE DETECTED NONE DETECTED    Comment:        DRUG SCREEN FOR MEDICAL PURPOSES ONLY.  IF CONFIRMATION IS NEEDED FOR ANY PURPOSE, NOTIFY LAB WITHIN 5 DAYS.        LOWEST DETECTABLE LIMITS FOR URINE DRUG SCREEN Drug Class       Cutoff (ng/mL) Amphetamine      1000 Barbiturate      200 Benzodiazepine   240 Tricyclics       973 Opiates          300 Cocaine          300 THC              50     Blood Alcohol level:  Lab Results  Component Value Date   ETH <5 53/29/9242    Metabolic Disorder Labs:  Lab Results  Component Value Date   HGBA1C 7.0* 01/20/2015   MPG 154* 01/20/2015   No results found for: PROLACTIN Lab Results  Component Value Date   CHOL 122* 01/20/2015   TRIG 187* 01/20/2015   HDL 34* 01/20/2015   CHOLHDL 3.6 01/20/2015   VLDL 37* 01/20/2015   LDLCALC 51 01/20/2015    Current Medications: Current Facility-Administered Medications  Medication Dose Route Frequency Provider Last Rate Last Dose  .  acetaminophen (TYLENOL) tablet 650 mg  650 mg Oral Q6H PRN Niel Hummer, NP   650 mg at 07/07/15 1026  . alum & mag hydroxide-simeth (MAALOX/MYLANTA) 200-200-20 MG/5ML suspension 30 mL  30 mL Oral Q4H PRN Niel Hummer, NP      . hydrOXYzine (ATARAX/VISTARIL) tablet 50 mg  50 mg Oral QHS PRN Niel Hummer, NP   50 mg at 07/07/15 1027  . magnesium hydroxide (MILK OF MAGNESIA) suspension 30 mL  30 mL Oral Daily PRN Niel Hummer, NP      . traZODone (DESYREL) tablet 150 mg  150 mg Oral QHS Laverle Hobby, PA-C   150 mg at 07/06/15 2259   PTA Medications: Prescriptions prior to admission  Medication Sig Dispense Refill Last Dose  . acetaminophen (TYLENOL) 500 MG tablet Take 1,000 mg by mouth every 6 (six) hours as needed for mild pain.    07/06/2015 at Unknown time  . ibuprofen (ADVIL,MOTRIN) 600 MG tablet Take 1 tablet (600 mg total) by mouth every 8 (eight) hours as needed. (Patient taking differently: Take 600 mg by mouth every 8 (eight) hours as needed for moderate pain. ) 15 tablet 0 unknown  . lisinopril (PRINIVIL,ZESTRIL) 20 MG tablet TAKE 1 Tablet BY MOUTH ONCE DAILY 90 tablet 1 07/06/2015 at Unknown time  . lovastatin (MEVACOR) 20 MG tablet Take 1 tablet (20 mg total) by mouth at bedtime. 30 tablet 3 07/05/2015 at Unknown time  . metFORMIN (GLUCOPHAGE-XR) 500 MG 24 hr tablet TAKE 1 Tablet BY MOUTH ONCE DAILY 90 tablet 2 07/06/2015 at Unknown time  . metoprolol (LOPRESSOR) 50 MG tablet Take 1 tablet (50 mg total) by mouth 2 (two) times daily. 180 tablet 1 07/06/2015 at 0800  . traZODone (DESYREL) 150 MG tablet Take 150 mg by mouth at bedtime.   unknown    Musculoskeletal: Strength & Muscle Tone: within normal limits Gait & Station: normal Patient leans: normal  Psychiatric Specialty Exam: Physical Exam  Review of Systems  Constitutional: Positive for malaise/fatigue.  HENT: Negative.   Eyes: Negative.   Respiratory: Negative.   Cardiovascular: Negative.   Gastrointestinal: Negative.    Genitourinary: Negative.   Musculoskeletal: Negative.   Skin: Negative.   Neurological: Positive for weakness.  Endo/Heme/Allergies: Negative.   Psychiatric/Behavioral: Positive for depression, suicidal ideas and substance abuse. The patient is nervous/anxious and has insomnia.     Blood pressure 128/71, pulse 101, temperature 98.7 F (37.1 C), temperature source Oral, resp. rate 16, height 5' 6" (1.676 m), weight 127.007 kg (280 lb), SpO2 98 %.Body mass index is 45.21 kg/(m^2).  General Appearance: Fairly Groomed  Engineer, water::  Fair  Speech:  Clear and Coherent  Volume:  Decreased  Mood:  Anxious, Depressed and Dysphoric  Affect:  anxious depressed worried  Thought Process:  Coherent and Goal Directed  Orientation:  Full (Time, Place, and Person)  Thought Content:  symptoms events worries concerns  Suicidal Thoughts:  Yes.  with intent/plan  Homicidal Thoughts:  No  Memory:  Immediate;   Fair Recent;   Fair Remote;   Fair  Judgement:  Fair  Insight:  Present  Psychomotor Activity:  Restlessness  Concentration:  Fair  Recall:  Smiley Houseman of Knowledge:Fair  Language: Fair  Akathisia:  No  Handed:  Right  AIMS (if indicated):     Assets:  Desire for Improvement Housing Vocational/Educational  ADL's:  Intact  Cognition: WNL  Sleep:        Treatment Plan Summary: Daily contact with patient to assess and evaluate symptoms and progress in treatment and Medication management  Observation Level/Precautions:  15 minute checks  Laboratory:  As per the ED  Psychotherapy:  Individual/group  Medications:  Will monitor detox needs and address accodingly  Consultations:    Discharge Concerns:  Need for a residential treatment program  Estimated LOS: 3-5 days  Other:     I certify that inpatient services furnished can reasonably be expected to improve the patient's condition.   Supportive approach/coping skills Cocaine dependence; monitor for withdrawal-detox  needs Depression; reassess for the need for an antidepressant Mood instability; reassess for a mood stabilizer Work with CBT/mindfulness; work on grief and loss as he anticipates the death of his father Explore need for a residential treatment program  Nicholaus Bloom, MD 4/4/201710:34 AM

## 2015-07-07 NOTE — Tx Team (Addendum)
Interdisciplinary Treatment Plan Update (Adult)  Date:  07/07/2015  Time Reviewed:  8:53 AM   Progress in Treatment: Attending groups: Intermittently  Participating in groups:  Yes, when he attends  Taking medication as prescribed:  Yes. Tolerating medication:  Yes. Family/Significant othe contact made:  SPE completed with pt; as he declined to consent to family contact.  Patient understands diagnosis:  Yes. and As evidenced by:  seeking treatment for Discussing patient identified problems/goals with staff:  Yes. Medical problems stabilized or resolved:  Yes. Denies suicidal/homicidal ideation: Yes. Issues/concerns per patient self-inventory:  Other:  Discharge Plan or Barriers: CSW assessing for appropriate referrals. Pt requested [facility] and [facility]. After this was provided, pt stated that he did not want to pursue either and cannot identify plan at this time. "I'm not sure where I want to go or what I want to do." Pt given information about how to get set up for [facility] services per his request.   Reason for Continuation of Hospitalization: Depression Hallucinations Medication stabilization  Comments:  Mark Acosta is an 45 y.o. male who presents reporting symptoms of depression and suicidal ideation. Pt has a history of bipolar and has recent stressors of his mom dying 2 years ago and his dad currently dying. He says he has been increasingly depressed and not sleeping for the past 2 months, and "I pray to god to take me, I am tired of myself". He states that this weekend he relapsed after 14 months sobriety and used 1/2 gram of cocaine in an attempt for his "heart to blow up". He has also thought about slitting his wrist or hanging himself. In 2016, he was about to hang himself with a rope near a tree, when his mom dies, but his father talked him out of it and he went to [facility] for help.Pt reports stopping medication a couple of months ago. He states he was  on "something good" from [facility], and the MD at mental health took him off and put him on Trazadone for sleep, which did not work, so he stopped taking it. He hasn't gone back to Op treatment for 6 mo.Pt acknowledges symptoms including crying spells, social withdrawal, loss of interest in usual pleasures, decreased concentration, fatigue, irritability, decreased sleep, decreased appetite and feelings of hopelessness. PT denies homicidal ideation or history of violence. Pt admits to mild auditory and visual hallucinations "hearing bumps and seeing shadows". Pt denies alcohol or substance abuse--although UDS positive for cocaine. Pt lives with a roommate who drinks, and had lived for 10 months in a halfway house for men [facility]) and was doing well at that time. His supports include his family, but he states, "They have their own lives". Pt denies history of abuse and trauma. 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 Pt has fair insight and judgement. Diagnosis: Bipolar Disorder  Estimated length of stay:  3-5 days   New goal(s): to develop effective aftercare plan   Additional Comments:  Patient and CSW reviewed pt's identified goals and treatment plan. Patient verbalized understanding and agreed to treatment plan. CSW reviewed [facility] "Discharge Process and Patient Involvement" Form. Pt verbalized understanding of information provided and signed form.    Review of initial/current patient goals per problem list:  1. Goal(s): Patient will participate in aftercare plan  Met: No.   Target date: at discharge  As evidenced by: Patient will participate within aftercare plan AEB aftercare provider and housing plan at discharge  being identified.  4/4: CSW assessing for appropriate referrals.   4/6: Multiple resources provided to pt; he continues to struggle with committing to a discharge plan. CSW continuing to assess.   2. Goal (s): Patient will exhibit decreased depressive  symptoms and suicidal ideations.  Met: No.    Target date: at discharge  As evidenced by: Patient will utilize self rating of depression at 3 or below and demonstrate decreased signs of depression or be deemed stable for discharge by MD.  4/4: Pt rates depression as high. Denies SI/HI/AVH currently.   4/6: Pt rates depression as high. Passive SI at times (able to contract for safety on the unit).   3. Goal(s): Patient will demonstrate decreased signs of withdrawal due to substance abuse  Met:No-Goal progressing.  Target date:at discharge   As evidenced by: Patient will produce a CIWA/COWS score of 0, have stable vitals signs, and no symptoms of withdrawal.  4/4: Pt reports mild withdrawals with no COWS and high standing Pulse/Sitting BP.   4/6: Pt reports mild withdrawals with COWS of 1 and stable vitals. Goal progressing.  Attendees: Patient:   07/07/2015 8:53 AM   Family:   07/07/2015 8:53 AM   Physician:  Dr. Carlton Adam, MD 07/07/2015 8:53 AM   Nursing:   Laretta Bolster RN 07/07/2015 8:53 AM   Clinical Social Worker: Maxie Better, LCSW 07/07/2015 8:53 AM   Clinical Social Worker: Peri Maris LCSWA  07/07/2015 8:53 AM   Other:  Gerline Legacy Nurse Case Manager 07/07/2015 8:53 AM   Other:  Agustina Caroli NP 07/07/2015 8:53 AM   Other:   07/07/2015 8:53 AM   Other:  07/07/2015 8:53 AM   Other:  07/07/2015 8:53 AM   Other:  07/07/2015 8:53 AM    07/07/2015 8:53 AM    07/07/2015 8:53 AM    07/07/2015 8:53 AM    07/07/2015 8:53 AM    Scribe for Treatment Team:   Maxie Better, LCSW 07/07/2015 8:53 AM

## 2015-07-07 NOTE — Plan of Care (Signed)
Problem: Consults Goal: Depression Patient Education See Patient Education Module for education specifics.  Outcome: Progressing Nurse discussed depression/coping skills with patient.        

## 2015-07-07 NOTE — [facility] Group Notes (Signed)
[facility] LCSW Group Therapy  07/07/2015 2:20 PM  Type of Therapy:  Group Therapy  Participation Level:  Active  Participation Quality:  Attentive  Affect:  Appropriate  Cognitive:  Alert and Oriented  Insight:  Improving  Engagement in Therapy:  Improving  Modes of Intervention:  Confrontation, Discussion, Education, Exploration, Problem-solving, Rapport Building, Socialization and Support  Summary of Progress/Problems: [facility] Speaker came to talk about his personal journey with substance abuse and addiction. The pt processed ways by which to relate to the speaker. [facility] speaker provided handouts and educational information pertaining to groups and services offered by the [facility].   Smart, Terriyah Westra LCSW 07/07/2015, 2:20 PM  

## 2015-07-07 NOTE — Progress Notes (Signed)
D    Pt is pleasant and appropriate   He was concerned because he wasn't started back up on his home medications yet   He denies withdrawal and said he slept well last night    He attends groups and interacts appropriately with others  A   Verbal support given    Medications administered and effectiveness monitored    Q 15 min checks   Encouraged pt to speak with his doctor about his medications R   Pt safe at present and agreed to speak with his doctor about his medications

## 2015-07-07 NOTE — [facility] Suicide Risk Assessment (Signed)
[facility]   Nursing information obtained from:  Patient Demographic factors:  Male, Caucasian, Living alone Current Mental Status:  Suicidal ideation indicated by patient Loss Factors:  Loss of significant relationship Historical Factors:  Prior suicide attempts, Impulsivity Risk Reduction Factors:  Sense of responsibility to family  Total Time spent with patient: 45 minutes Principal Problem: Severe recurrent major depression without psychotic features (HCC) Diagnosis:   Patient Active Problem List   Diagnosis Date Noted  . Severe recurrent major depression without psychotic features (HCC) [F33.2] 07/07/2015  . Cocaine-induced mood disorder (HCC) [F14.94] 07/06/2015  . Reflux esophagitis [K21.0]   . Diverticulosis of large intestine without hemorrhage [K57.30]   . Hematochezia [K92.1]   . Heme + stool [R19.5] 05/11/2015  . Rectal bleeding [K62.5] 05/11/2015  . Morbid obesity (HCC) [E66.01] 01/24/2015  . Type II diabetes mellitus, uncontrolled (HCC) [E11.65] 01/20/2015  . Essential hypertension, benign [I10] 01/20/2015  . Hyperlipemia [E78.5] 01/20/2015   Subjective Data: see admission H and P  Continued Clinical Symptoms:  Alcohol Use Disorder Identification Test Final Score (AUDIT): 0 The "Alcohol Use Disorders Identification Test", Guidelines for Use in Primary Care, Second Edition.  World Science writerHealth Organization [facility](WHO). Score between 0-7:  no or low risk or alcohol related problems. Score between 8-15:  moderate risk of alcohol related problems. Score between 16-19:  high risk of alcohol related problems. Score 20 or above:  warrants further diagnostic evaluation for alcohol dependence and treatment.   CLINICAL FACTORS:   Depression:   Comorbid alcohol abuse/dependence Alcohol/Substance Abuse/Dependencies   Psychiatric Specialty Exam: ROS  Blood pressure 128/71, pulse 101, temperature 98.7 F (37.1 C), temperature source Oral, resp. rate 16,  height 5\' 6"  (1.676 m), weight 127.007 kg (280 lb), SpO2 98 %.Body mass index is 45.21 kg/(m^2).   COGNITIVE FEATURES THAT CONTRIBUTE TO RISK:  Closed-mindedness, Polarized thinking and Thought constriction (tunnel vision)    SUICIDE RISK:   Moderate:  Frequent suicidal ideation with limited intensity, and duration, some specificity in terms of plans, no associated intent, good self-control, limited dysphoria/symptomatology, some risk factors present, and identifiable protective factors, including available and accessible social support.  PLAN OF CARE: see admission H and P  I certify that inpatient services furnished can reasonably be expected to improve the patient's condition.   Rachael FeeLUGO,Janera Peugh A, MD 07/07/2015, 4:42 PM

## 2015-07-07 NOTE — [facility] Suicide Risk Assessment (Signed)
[facility] INPATIENT:  Family/Significant Other Suicide Prevention Education  Suicide Prevention Education:  Patient Refusal for Family/Significant Other Suicide Prevention Education: The patient Mark Acosta has refused to provide written consent for family/significant other to be provided Family/Significant Other Suicide Prevention Education during admission and/or prior to discharge.  Physician notified.  SPE completed with pt, as pt refused to consent to family contact. SPI pamphlet provided to pt and pt was encouraged to share information with support network, ask questions, and talk about any concerns relating to SPE. Pt denies access to guns/firearms and verbalized understanding of information provided. Mobile Crisis information also provided to pt.   Smart, Sylvana Bonk LCSW 07/07/2015, 1:39 PM

## 2015-07-07 NOTE — Progress Notes (Signed)
Pt mentioned he had a good first day. Pt goal for tomorrow is to have a better tomorrow.

## 2015-07-07 NOTE — [facility] Group Notes (Signed)
Patient did not attend group.

## 2015-07-07 NOTE — Progress Notes (Signed)
Recreation Therapy Notes  Animal-Assisted Activity (AAA) Program Checklist/Progress Notes Patient Eligibility Criteria Checklist & Daily Group note for Rec Tx Intervention  Date: 04.04.2017 Time: 2:45pm Location: 400 Hall Dayroom    AAA/T Program Assumption of Risk Form signed by Patient/ or Parent Legal Guardian Yes  Patient is free of allergies or sever asthma Yes  Patient reports no fear of animals Yes  Patient reports no history of cruelty to animals Yes  Patient understands his/her participation is voluntary Yes  Patient washes hands before animal contact Yes  Patient washes hands after animal contact Yes  Behavioral Response: Appropriate   Education: Hand Washing, Appropriate Animal Interaction   Education Outcome: Acknowledges education.   Clinical Observations/Feedback: Patient interacted appropriately with therapy dog and peers during session.    Mark Acosta L Mark Acosta, [name]        Mark Acosta L 07/07/2015 3:04 PM 

## 2015-07-07 NOTE — [facility] Group Notes (Signed)
The focus of this group is to educate the patient on the purpose and policies of crisis stabilization and provide a format to answer questions about their admission.  The group details unit policies and expectations of patients while admitted.  Patient did not attend 0900 nurse education orientation group this morning.  Patient stayed in bed.   

## 2015-07-07 NOTE — Progress Notes (Signed)
EKG completed and put in MD's office for review.

## 2015-07-07 NOTE — [facility] Counselor (Signed)
Adult Comprehensive Assessment  Patient ID: Mark Acosta, male   DOB: 09/18/1970, 45 y.o.   MRN: [id number]  Information Source: Information source: Patient  Current Stressors:  Educational / Learning stressors: high school Employment / Job issues: remodeling Family Relationships: close to father and some siblings Museum/gallery curator / Lack of resources (include bankruptcy): income from employment Housing / Lack of housing: living alone. "I think I need to move and start fresh somewhere else."  Physical health (include injuries & life threatening diseases): none identified Social relationships: some friends in [place] but has not reached out to them in [place].  Substance abuse: crack cocaine abuse,  Bereavement / Loss: recent death of mother ( 2 years ago). father is sick/dying of cancer.   Living/Environment/Situation:  Living Arrangements: Alone Living conditions (as described by patient or guardian): living alone currently. had a roomate but was not a good situation. How long has patient lived in current situation?: few years ('but I've been living in [place] for my whole life."  What is atmosphere in current home: Comfortable  Family History:  Marital status: Single Are you sexually active?: No What is your sexual orientation?: heterosexual Has your sexual activity been affected by drugs, alcohol, medication, or emotional stress?: no motivation or strenght. isolating.   Childhood History:  By whom was/is the patient raised?: Both parents Additional childhood history information: adopted when I was one. Met both biological parents. bio mom is dad. bio dad is dead. My dad is a man's man. mom was wonderful Description of patient's relationship with caregiver when they were a child: close to both parents Patient's description of current relationship with people who raised him/her: close to father who is sick with cancer. close to mother until her death 2 years ago How were you  disciplined when you got in trouble as a child/adolescent?: spoken to.  Does patient have siblings?: Yes Number of Siblings: 5 Description of patient's current relationship with siblings: four sisters and one brother. twin sister-me and her were both adopted.  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 It's adding to the stress and anxiety of my life."  What is the longest time patient has a held a job?: see above Where was the patient employed at that time?: see above.  Has patient ever been in the [employer]?: Yes (Describe in comment) ([employer]) Has patient ever served in combat?: No Did You Receive Any Psychiatric Treatment/Services While in the [place]?: No Are There Guns or Other Weapons in [place]?: No Are These [place]?:  (n/a)  Financial Resources:   Financial resources: Income from employment Does patient have a representative payee or guardian?: No  Alcohol/Substance Abuse:   What has been your use of drugs/alcohol within the last 12 months?: recent use  of crack cocaine "I was trying to blow my heart up." hx of substance abuse and reocvery/sobriety.  If attempted suicide, did  drugs/alcohol play a role in this?: Yes ("I smoked crack cocaine last weekend trying to blow up my heart." ) Alcohol/Substance Abuse Treatment Hx: Past Tx, Inpatient, Past Tx, Outpatient If yes, describe treatment: [facility] last year for similiar issues (SI/depression). [facility] but [facility] 3 months ago.  Has alcohol/substance abuse ever caused legal problems?: No  Social Support System:   Patient's Community Support System: Poor Describe Community Support System: few friends/social supports. father is sick. Type of faith/religion: christian How does patient's faith help to cope with current illness?: prayer  Leisure/Recreation:   Leisure and Hobbies: outdoors stuff; remodeling.   Strengths/Needs:   What things does the patient do well?: hard worker; nice guy; motivated to get treatment and get well. In what areas does patient struggle / problems for patient: coping with grief/loss; depression/insomnia.   Discharge Plan:   Does patient have access to transportation?: Yes (license but truck is broken down. depression is getting deeper because of that.) Will patient be returning to same living situation after discharge?: Yes Currently receiving community mental health services: No If no, would patient like referral for services when discharged?: Yes (What county?) ([place]) Does patient have financial barriers related to discharge medications?: Yes Patient description of barriers related to discharge medications: no insurance. limited income.  Summary/Recommendations:   Summary and Recommendations (to be completed by the evaluator): Patient is 45 year old male living in [place] with a prior diagnosis of MDD severe and Bipolar Disorder with psychosis.  He presents to the hospital seeking treatment for suicidal ideations with a plan, increased depression/mood lability, cocaine abuse, auditory/visual hallucinations, and for medication  stabilization. Recommendations for patient include: crisis stabilization, therapeutic milieu, encourage group attendance and participation, medication management for mood stabilization/elimination of AVH, and development of comprehensive mental wellness/sobriety plan.   Smart, Sayler Mickiewicz LCSW 07/07/2015 10:50 AM

## 2015-07-08 LAB — LIPID PANEL
Cholesterol: 181 mg/dL (ref 0–200)
HDL: 41 mg/dL (ref >40)
LDL Cholesterol: 99 mg/dL (ref 0–99)
TRIGLYCERIDES: 203 mg/dL — AB (ref <150)
Total CHOL/HDL Ratio: 4.4 RATIO
VLDL: 41 mg/dL — ABNORMAL HIGH (ref 0–40)

## 2015-07-08 MED ORDER — GABAPENTIN 300 MG PO CAPS
300.0000 mg | ORAL_CAPSULE | Freq: Three times a day (TID) | ORAL | Status: DC
Start: 2015-07-08 — End: 2015-07-22
  Administered 2015-07-08 – 2015-07-22 (×41): 300 mg via ORAL
  Filled 2015-07-08 (×44): qty 1

## 2015-07-08 MED ORDER — METFORMIN HCL ER 500 MG PO TB24
500.0000 mg | ORAL_TABLET | Freq: Every day | ORAL | Status: DC
  Administered 2015-07-09 – 2015-07-22 (×14): 500 mg via ORAL
  Filled 2015-07-08 (×16): qty 1

## 2015-07-08 MED ORDER — LISINOPRIL 20 MG PO TABS
20.0000 mg | ORAL_TABLET | Freq: Every day | ORAL | Status: DC
  Administered 2015-07-08 – 2015-07-22 (×15): 20 mg via ORAL
  Filled 2015-07-08 (×17): qty 1

## 2015-07-08 MED ORDER — METOPROLOL TARTRATE 50 MG PO TABS: 50.0000 mg | ORAL_TABLET | Freq: Two times a day (BID) | ORAL | Status: DC

## 2015-07-08 MED ORDER — ESCITALOPRAM OXALATE 5 MG PO TABS
5.0000 mg | ORAL_TABLET | Freq: Every day | ORAL | Status: DC
  Filled 2015-07-08 (×2): qty 1

## 2015-07-08 MED ORDER — PRAVASTATIN SODIUM 10 MG PO TABS: 10.0000 mg | ORAL_TABLET | Freq: Every day | ORAL | Status: DC

## 2015-07-08 MED ORDER — LAMOTRIGINE 25 MG PO TABS
25.0000 mg | ORAL_TABLET | Freq: Every day | ORAL | Status: DC
  Administered 2015-07-08 – 2015-07-11 (×4): 25 mg via ORAL
  Filled 2015-07-08 (×7): qty 1

## 2015-07-08 MED ORDER — PRAVASTATIN SODIUM 20 MG PO TABS
20.0000 mg | ORAL_TABLET | Freq: Every day | ORAL | Status: DC
  Administered 2015-07-09 – 2015-07-21 (×13): 20 mg via ORAL
  Filled 2015-07-08 (×16): qty 1

## 2015-07-08 MED ORDER — METOPROLOL TARTRATE 50 MG PO TABS
50.0000 mg | ORAL_TABLET | Freq: Two times a day (BID) | ORAL | Status: DC
  Administered 2015-07-08 – 2015-07-22 (×28): 50 mg via ORAL
  Filled 2015-07-08 (×31): qty 1

## 2015-07-08 NOTE — Progress Notes (Signed)
D:  Patient's self inventory sheet, patient has poor sleep, sleep medication is not helpful.  Good appetite, low energy level, poor concentration.  Rated depression and hopeless 8, anxiety 9.  Withdrawals, cravings, irritability.  SI off/on, contracts for safety.  Denied physical problems.  Physical pain, back, worst pain in past 24 hours is #7.  Pain medication is helpful.  Goal is to feel better. Plans to try to be around people.  No discharge plans. A:  Safety maintained with 15 minute checks. R:  While patient was talking to nurse this morning, patient denied SI and HI, contracts for safety.  Denied A/V hallucinations.  Safety maintained with 15 minute checks.

## 2015-07-08 NOTE — [facility] Group Notes (Signed)
[facility] LCSW Group Therapy  07/08/2015 3:10 PM  Type of Therapy:  Group Therapy  Participation Level:  Did Not Attend-Pt chose to rest in room.   Summary of Progress/Problems: Feelings around Relapse.   Smart, Ayrianna Mcginniss LCSW 07/08/2015, 3:10 PM

## 2015-07-08 NOTE — Progress Notes (Signed)
Recreation Therapy Notes  Date: 04.04.02017 Time: 9:30am Location: 300 Hall Group Room   Group Topic: Stress Management  Goal Area(s) Addresses:  Patient will actively participate in stress management techniques presented during session.   Behavioral Response: Did not attend.   Garison Genova L Hakiem Malizia, [name]        Gola Bribiesca L 07/08/2015 2:20 PM 

## 2015-07-08 NOTE — Progress Notes (Signed)
[facility]  07/08/2015 4:39 PM Mark Acosta  MRN:  [id number] Subjective:  Mark Acosta describes a pattern of mood fluctuations when he goes all out. States that when he accomplishes some sort of stability he suddenly changes does impulsive things spends money and ends up destroying what he has accomplished. States that sometimes his use of substances has to do with his trying to regain control of the way he is feeling. He still cant remember the medications he has been given does say Lamictal sounds familiar Principal Problem: Severe recurrent major depression without psychotic features (HCC) Diagnosis:   Patient Active Problem List   Diagnosis Date Noted  . Severe recurrent major depression without psychotic features (HCC) [F33.2] 07/07/2015  . Cocaine-induced mood disorder (HCC) [F14.94] 07/06/2015  . Reflux esophagitis [K21.0]   . Diverticulosis of large intestine without hemorrhage [K57.30]   . Hematochezia [K92.1]   . Heme + stool [R19.5] 05/11/2015  . Rectal bleeding [K62.5] 05/11/2015  . Morbid obesity (HCC) [E66.01] 01/24/2015  . Type II diabetes mellitus, uncontrolled (HCC) [E11.65] 01/20/2015  . Essential hypertension, benign [I10] 01/20/2015  . Hyperlipemia [E78.5] 01/20/2015   Total Time spent with patient: 20 minutes  Past Psychiatric History: see admission H and P  Past Medical History:  Past Medical History  Diagnosis Date  . Gout   . Diverticula of colon   . Substance abuse     hx etoh, marijuana, cocaine. quit 02/2014  . Diabetes mellitus without complication (HCC)     boarderline    Past Surgical History  Procedure Laterality Date  . None to date      As of 05/11/15  . Colonoscopy with propofol N/A 06/04/2015    RMR: internal hemorrhoids/anal papilla  . Esophagogastroduodenoscopy (egd) with propofol N/A 06/04/2015    RMR: mild erosive reflux   Family History:  Family History  Problem Relation Age of Onset  . Colon cancer Neg Hx    Family Psychiatric   History: see admission H and P Social History:  History  Alcohol Use No    Comment: hx alcoholism. Now only a drink on special occasions (2-3 times a year)     History  Drug Use  . Yes  . Special: Cocaine, Marijuana    Social History   Social History  . Marital Status: Single    Spouse Name: N/A  . Number of Children: N/A  . Years of Education: N/A   Social History Main Topics  . Smoking status: Never Smoker   . Smokeless tobacco: Never Used  . Alcohol Use: No     Comment: hx alcoholism. Now only a drink on special occasions (2-3 times a year)  . Drug Use: Yes    Special: Cocaine, Marijuana  . Sexual Activity: Not Asked   Other Topics Concern  . None   Social History Narrative   Additional Social History:    Pain Medications: denies currently Prescriptions: denies currently Over the Counter: denies currently History of alcohol / drug use?: Yes Longest period of sobriety (when/how long): 14 months Negative Consequences of Use: Financial, Personal relationships Withdrawal Symptoms: Other (Comment) (None reported) Name of Substance 1: cocaine 1 - Age of First Use: unk 1 - Amount (size/oz): 1/2 oz 1 - Duration: 1 time 1 - Last Use / Amount: past weekend                  Sleep: Fair  Appetite:  Fair  Current Medications: Current Facility-Administered Medications  Medication Dose  Route Frequency Provider Last Rate Last Dose  . acetaminophen (TYLENOL) tablet 650 mg  650 mg Oral Q6H PRN Thermon Leyland, NP   650 mg at 07/07/15 1026  . alum & mag hydroxide-simeth (MAALOX/MYLANTA) 200-200-20 MG/5ML suspension 30 mL  30 mL Oral Q4H PRN Thermon Leyland, NP      . gabapentin (NEURONTIN) capsule 300 mg  300 mg Oral TID Rachael Fee, MD      . hydrOXYzine (ATARAX/VISTARIL) tablet 50 mg  50 mg Oral QHS PRN Thermon Leyland, NP   50 mg at 07/07/15 2132  . hydrOXYzine (ATARAX/VISTARIL) tablet 50 mg  50 mg Oral Q6H PRN Rachael Fee, MD   50 mg at 07/08/15 0959  .  lisinopril (PRINIVIL,ZESTRIL) tablet 20 mg  20 mg Oral Daily Rachael Fee, MD   20 mg at 07/08/15 1552  . magnesium hydroxide (MILK OF MAGNESIA) suspension 30 mL  30 mL Oral Daily PRN Thermon Leyland, NP      . Melene Muller ON 07/09/2015] metFORMIN (GLUCOPHAGE-XR) 24 hr tablet 500 mg  500 mg Oral Q breakfast Rachael Fee, MD      . metoprolol (LOPRESSOR) tablet 50 mg  50 mg Oral BID Rachael Fee, MD      . pravastatin (PRAVACHOL) tablet 20 mg  20 mg Oral q1800 Rachael Fee, MD      . traZODone (DESYREL) tablet 150 mg  150 mg Oral QHS Kerry Hough, PA-C   150 mg at 07/07/15 2132    Lab Results:  Results for orders placed or performed during the hospital encounter of 07/06/15 (from the past 48 hour(s))  Lipid panel     Status: Abnormal   Collection Time: 07/08/15  6:22 AM  Result Value Ref Range   Cholesterol 181 0 - 200 mg/dL   Triglycerides 161 (H) <150 mg/dL   HDL 41 >09 mg/dL   Total CHOL/HDL Ratio 4.4 RATIO   VLDL 41 (H) 0 - 40 mg/dL   LDL Cholesterol 99 0 - 99 mg/dL    Comment:        Total Cholesterol/HDL:CHD Risk Coronary Heart Disease Risk Table                     Men   Women  1/2 Average Risk   3.4   3.3  Average Risk       5.0   4.4  2 X Average Risk   9.6   7.1  3 X Average Risk  23.4   11.0        Use the calculated Patient Ratio above and the CHD Risk Table to determine the patient's CHD Risk.        ATP III CLASSIFICATION (LDL):  <100     mg/dL   Optimal  604-540  mg/dL   Near or Above                    Optimal  130-159  mg/dL   Borderline  981-191  mg/dL   High  >478     mg/dL   Very High Performed at [facility]     Blood Alcohol level:  Lab Results  Component Value Date   [facility] <5 07/06/2015    Physical Findings: AIMS: Facial and Oral Movements Muscles of Facial Expression: None, normal Lips and Perioral Area: None, normal Jaw: None, normal Tongue: None, normal,Extremity Movements Upper (arms, wrists, hands, fingers): None, normal Lower  (  legs, knees, ankles, toes): None, normal, Trunk Movements Neck, shoulders, hips: None, normal, Overall Severity Severity of abnormal movements (highest score from questions above): None, normal Incapacitation due to abnormal movements: None, normal Patient's awareness of abnormal movements (rate only patient's report): No Awareness, Dental Status Current problems with teeth and/or dentures?: No Does patient usually wear dentures?: No  CIWA:  CIWA-Ar Total: 1 COWS:  COWS Total Score: 2  Musculoskeletal: Strength & Muscle Tone: within normal limits Gait & Station: normal Patient leans: normal  Psychiatric Specialty Exam: Review of Systems  Constitutional: Positive for malaise/fatigue.  HENT: Negative.   Eyes: Negative.   Respiratory: Negative.   Cardiovascular: Negative.   Gastrointestinal: Negative.   Genitourinary: Negative.   Musculoskeletal: Negative.   Skin: Negative.   Neurological: Positive for weakness.  Endo/Heme/Allergies: Negative.   Psychiatric/Behavioral: Positive for depression, suicidal ideas and substance abuse. The patient is nervous/anxious.     Blood pressure 129/77, pulse 86, temperature 98.4 F (36.9 C), temperature source Oral, resp. rate 17, height  (1.676 m), weight 127.007 kg (280 lb), SpO2 98 %.Body mass index is 45.21 kg/(m^2).  General Appearance: Fairly Groomed  Patent attorney::  Fair  Speech:  Clear and Coherent  Volume:  Decreased  Mood:  Anxious, Depressed, Dysphoric and Hopeless  Affect:  Restricted and worried anxious  Thought Process:  Coherent and Goal Directed  Orientation:  Full (Time, Place, and Person)  Thought Content:  symptoms events worries concerns  Suicidal Thoughts:  Yes.  without intent/plan  Homicidal Thoughts:  No  Memory:  Immediate;   Fair Recent;   Fair Remote;   Fair  Judgement:  Fair  Insight:  Present and Shallow  Psychomotor Activity:  Restlessness  Concentration:  Fair  Recall:  [employer] of Knowledge:Fair   Language: Fair  Akathisia:  No  Handed:  Right  AIMS (if indicated):     Assets:  Desire for Improvement  ADL's:  Intact  Cognition: WNL  Sleep:  Number of Hours: 6.75   Treatment Plan Summary: Daily contact with patient to assess and evaluate symptoms and progress in treatment and Medication management Supportive approach/coping skills Cocaine abuse; continue to work a relapse prevention plan Mood instability; will start Lamictal and use Neurontin ( he has used it before for pain and he states it  has help him) will reassess for the use of another mood stabilizer Work with CBT/mindfulness Tania Perrott A, MD 07/08/2015, 4:39 PM

## 2015-07-08 NOTE — Plan of Care (Signed)
Problem: Consults Goal: Psychosis Patient Education See Patient Education Module for education specifics.  Outcome: Progressing Nurse discussed depression/anxiety/coping skills with patient.

## 2015-07-08 NOTE — Progress Notes (Signed)
Pt did not attend NA meeting this evening. Pt mentioned his anxiety level was high and he could not focus.

## 2015-07-08 NOTE — [facility] Group Notes (Signed)
[facility]   07/08/2015 9:39 AM  Participation Quality: Minimal   Mood/Affect:  Appropriate  Depression Rating:  8  Anxiety Rating:  9  Thoughts of Suicide:  No Will you contract for safety?   NA  Current AVH:  No  Plan for Discharge/Comments:  Pt reports that he is not feeling well this morning. Depressed/flat affect. Pt reports poor sleep and continued shakes. [facility] and [facility] information per his request. States that he is no longer interested.   Transportation Means: unknown at this time.   Supports: none identified by pt.   Smart, Doria Fern LCSW

## 2015-07-08 NOTE — Progress Notes (Signed)
D:Patient in the dayroom on first approach.  Patient states he has been anxious today.  Patient state she has had some crying spells.  Patient cannot pinpoint anything that is triggering his feelings.  Patient states, "I guess its just life."  Patient denies SI/HI and denies AVH. A: Staff to monitor Q 15 mins for safety.  Encouragement and support offered.  Scheduled medications administered per orders.   Vistaril administered prn. R: Patient remains safe on the unit.  Patient attended group tonight.  Patient visible on the unit.  Patient taking administered medications.

## 2015-07-09 ENCOUNTER — Encounter: Payer: Self-pay | Admitting: Nurse Practitioner

## 2015-07-09 ENCOUNTER — Ambulatory Visit: Payer: Self-pay | Admitting: Nurse Practitioner

## 2015-07-09 ENCOUNTER — Telehealth: Payer: Self-pay | Admitting: Nurse Practitioner

## 2015-07-09 LAB — HEMOGLOBIN A1C
HEMOGLOBIN A1C: 7.2 % — AB (ref 4.8–5.6)
Mean Plasma Glucose: 160 mg/dL

## 2015-07-09 LAB — TSH: TSH: 1.475 u[IU]/mL (ref 0.350–4.500)

## 2015-07-09 NOTE — Telephone Encounter (Signed)
Noted  

## 2015-07-09 NOTE — Telephone Encounter (Signed)
Letter mailed

## 2015-07-09 NOTE — Progress Notes (Signed)
DAR NOTE: Patient mood and affect remained depressed. Denies auditory and visual hallucinations.  Reports suicidal thoughts during assessment but verbally contracts for safety.  Rates depression at 8, hopelessness at 8, and anxiety at 7.  Maintained on routine safety checks.  Medications given as prescribed.  Support and encouragement offered as needed.  Attended group and participated.  States goal for today is "feeling better."  Patient observed socializing with peers in the dayroom.  Patient requested and received Tylenol 650 mg for pain and Vistaril 50 mg for anxiety with good effect.

## 2015-07-09 NOTE — Progress Notes (Signed)
Patient ID: Mark Acosta, male   DOB: Oct 26, 1970, 45 y.o.   MRN: [id number] [facility] MD Progress Note  07/09/2015 5:11 PM Mark Acosta  MRN:  [id number] Subjective:  Mark Acosta continues to describes a pattern of mood fluctuations. States that when he accomplishes some sort of stability he suddenly changes does impulsive things spends money and ends up destroying what he has accomplished. Feels that he loses control. Then becomes anergic despondent, does not want to do anything. States that sometimes his use of substances has to do with his trying to regain control of the way he is feeling.  Principal Problem: Severe recurrent major depression without psychotic features (HCC) Diagnosis:   Patient Active Problem List   Diagnosis Date Noted  . Severe recurrent major depression without psychotic features (HCC) [F33.2] 07/07/2015  . Cocaine-induced mood disorder (HCC) [F14.94] 07/06/2015  . Reflux esophagitis [K21.0]   . Diverticulosis of large intestine without hemorrhage [K57.30]   . Hematochezia [K92.1]   . Heme + stool [R19.5] 05/11/2015  . Rectal bleeding [K62.5] 05/11/2015  . Morbid obesity (HCC) [E66.01] 01/24/2015  . Type II diabetes mellitus, uncontrolled (HCC) [E11.65] 01/20/2015  . Essential hypertension, benign [I10] 01/20/2015  . Hyperlipemia [E78.5] 01/20/2015   Total Time spent with patient: 20 minutes  Past Psychiatric History: see admission H and P  Past Medical History:  Past Medical History  Diagnosis Date  . Gout   . Diverticula of colon   . Substance abuse     hx etoh, marijuana, cocaine. quit 02/2014  . Diabetes mellitus without complication (HCC)     boarderline    Past Surgical History  Procedure Laterality Date  . None to date      As of 05/11/15  . Colonoscopy with propofol N/A 06/04/2015    RMR: internal hemorrhoids/anal papilla  . Esophagogastroduodenoscopy (egd) with propofol N/A 06/04/2015    RMR: mild erosive reflux   Family History:  Family History  Problem  Relation Age of Onset  . Colon cancer Neg Hx    Family Psychiatric  History: see admission H and P Social History:  History  Alcohol Use No    Comment: hx alcoholism. Now only a drink on special occasions (2-3 times a year)     History  Drug Use  . Yes  . Special: Cocaine, Marijuana    Social History   Social History  . Marital Status: Single    Spouse Name: N/A  . Number of Children: N/A  . Years of Education: N/A   Social History Main Topics  . Smoking status: Never Smoker   . Smokeless tobacco: Never Used  . Alcohol Use: No     Comment: hx alcoholism. Now only a drink on special occasions (2-3 times a year)  . Drug Use: Yes    Special: Cocaine, Marijuana  . Sexual Activity: Not Asked   Other Topics Concern  . None   Social History Narrative   Additional Social History:    Pain Medications: denies currently Prescriptions: denies currently Over the Counter: denies currently History of alcohol / drug use?: Yes Longest period of sobriety (when/how long): 14 months Negative Consequences of Use: Financial, Personal relationships Withdrawal Symptoms: Other (Comment) (None reported) Name of Substance 1: cocaine 1 - Age of First Use: unk 1 - Amount (size/oz): 1/2 oz 1 - Duration: 1 time 1 - Last Use / Amount: past weekend                  Sleep: Fair  Appetite:  Fair  Current Medications: Current Facility-Administered Medications  Medication Dose Route Frequency Provider Last Rate Last Dose  . acetaminophen (TYLENOL) tablet 650 mg  650 mg Oral Q6H PRN Thermon Leyland, NP   650 mg at 07/09/15 1621  . alum & mag hydroxide-simeth (MAALOX/MYLANTA) 200-200-20 MG/5ML suspension 30 mL  30 mL Oral Q4H PRN Thermon Leyland, NP      . gabapentin (NEURONTIN) capsule 300 mg  300 mg Oral TID Rachael Fee, MD   300 mg at 07/09/15 1621  . hydrOXYzine (ATARAX/VISTARIL) tablet 50 mg  50 mg Oral QHS PRN Thermon Leyland, NP   50 mg at 07/08/15 2139  . hydrOXYzine  (ATARAX/VISTARIL) tablet 50 mg  50 mg Oral Q6H PRN Rachael Fee, MD   50 mg at 07/09/15 1201  . lamoTRIgine (LAMICTAL) tablet 25 mg  25 mg Oral Daily Rachael Fee, MD   25 mg at 07/09/15 0817  . lisinopril (PRINIVIL,ZESTRIL) tablet 20 mg  20 mg Oral Daily Rachael Fee, MD   20 mg at 07/09/15 0818  . magnesium hydroxide (MILK OF MAGNESIA) suspension 30 mL  30 mL Oral Daily PRN Thermon Leyland, NP      . metFORMIN (GLUCOPHAGE-XR) 24 hr tablet 500 mg  500 mg Oral Q breakfast Rachael Fee, MD   500 mg at 07/09/15 0819  . metoprolol (LOPRESSOR) tablet 50 mg  50 mg Oral BID Rachael Fee, MD   50 mg at 07/09/15 0818  . pravastatin (PRAVACHOL) tablet 20 mg  20 mg Oral q1800 Rachael Fee, MD   20 mg at 07/08/15 2100  . traZODone (DESYREL) tablet 150 mg  150 mg Oral QHS Kerry Hough, PA-C   150 mg at 07/08/15 2139    Lab Results:  Results for orders placed or performed during the hospital encounter of 07/06/15 (from the past 48 hour(s))  Hemoglobin A1c     Status: Abnormal   Collection Time: 07/08/15  6:22 AM  Result Value Ref Range   Hgb A1c MFr Bld 7.2 (H) 4.8 - 5.6 %    Comment: (NOTE)         Pre-diabetes: 5.7 - 6.4         Diabetes: >6.4         Glycemic control for adults with diabetes: <7.0    Mean Plasma Glucose 160 mg/dL    Comment: (NOTE) Performed At: [facility] [address] [place], [place] [id number] Mila Homer MD ON:[id number] Performed at [facility]   Lipid panel     Status: Abnormal   Collection Time: 07/08/15  6:22 AM  Result Value Ref Range   Cholesterol 181 0 - 200 mg/dL   Triglycerides 440 (H) <150 mg/dL   HDL 41 >10 mg/dL   Total CHOL/HDL Ratio 4.4 RATIO   VLDL 41 (H) 0 - 40 mg/dL   LDL Cholesterol 99 0 - 99 mg/dL    Comment:        Total Cholesterol/HDL:CHD Risk Coronary Heart Disease Risk Table                     Men   Women  1/2 Average Risk   3.4   3.3  Average Risk       5.0   4.4  2 X Average Risk   9.6   7.1   3 X Average Risk  23.4   11.0  Use the calculated Patient Ratio above and the CHD Risk Table to determine the patient's CHD Risk.        ATP III CLASSIFICATION (LDL):  <100     mg/dL   Optimal  [id number]  mg/dL   Near or Above                    Optimal  130-159  mg/dL   Borderline  [id number]  mg/dL   High  >324>190     mg/dL   Very High [facility]     Blood Alcohol level:  Lab Results  Component Value Date   [facility]2015    Physical Findings: AIMS: Facial and Oral Movements Muscles of Facial Expression: None, normal Lips and Perioral Area: None, normal Jaw: None, normal Tongue: None, normal,Extremity Movements Upper (arms, wrists, hands, fingers): None, normal Lower (legs, knees, ankles, toes): None, normal, Trunk Movements Neck, shoulders, hips: None, normal, Overall Severity Severity of abnormal movements (highest score from questions above): None, normal Incapacitation due to abnormal movements: None, normal Patient's awareness of abnormal movements (rate only patient's report): No Awareness, Dental Status Current problems with teeth and/or dentures?: No Does patient usually wear dentures?: No  CIWA:  CIWA-Ar Total: 1 COWS:  COWS Total Score: 2  Musculoskeletal: Strength & Muscle Tone: within normal limits Gait & Station: normal Patient leans: normal  Psychiatric Specialty Exam: Review of Systems  Constitutional: Positive for malaise/fatigue.  HENT: Negative.   Eyes: Negative.   Respiratory: Negative.   Cardiovascular: Negative.   Gastrointestinal: Negative.   Genitourinary: Negative.   Musculoskeletal: Negative.   Skin: Negative.   Neurological: Positive for weakness.  Endo/Heme/Allergies: Negative.   Psychiatric/Behavioral: Positive for depression, suicidal ideas and substance abuse. The patient is nervous/anxious.     Blood pressure 106/88, pulse 81, temperature 98.4 F (36.9 C), temperature source Oral, resp. rate 18,  height 5\' 6"  (1.676 m), weight 127.007 kg (280 lb), SpO2 98 %.Body mass index is 45.21 kg/(m^2).  General Appearance: Fairly Groomed  Patent attorneyye Contact::  Fair  Speech:  Clear and Coherent  Volume:  Decreased  Mood:  Anxious, Depressed, Dysphoric and Hopeless  Affect:  Restricted and worried anxious  Thought Process:  Coherent and Goal Directed  Orientation:  Full (Time, Place, and Person)  Thought Content:  symptoms events worries concerns  Suicidal Thoughts:  Yes.  without intent/plan  Homicidal Thoughts:  No  Memory:  Immediate;   Fair Recent;   Fair Remote;   Fair  Judgement:  Fair  Insight:  Present and Shallow  Psychomotor Activity:  Restlessness  Concentration:  Fair  Recall:  FiservFair  Fund of Knowledge:Fair  Language: Fair  Akathisia:  No  Handed:  Right  AIMS (if indicated):     Assets:  Desire for Improvement  ADL's:  Intact  Cognition: WNL  Sleep:  Number of Hours: 6.75   Treatment Plan Summary: Daily contact with patient to assess and evaluate symptoms and progress in treatment and Medication management Supportive approach/coping skills Cocaine abuse; continue to work a relapse prevention plan Mood instability; will continue the Lamictal and the Neurontin Insomnia; continue the Trazodone 150 HS (feels he slept a little better) He agrees that what ever is going on with him will get worst if he continues to drink or  uses substance Work with CBT/mindfulness Mark Acosta A, MD 07/09/2015, 5:11 PM

## 2015-07-09 NOTE — Progress Notes (Signed)
[facility] Group Notes:  (Nursing/MHT/Case Management/Adjunct)  Date:  07/09/2015  Time:  2100  Type of Therapy:  wrap up group  Participation Level:  Active  Participation Quality:  Appropriate, Attentive, Sharing and Supportive  Affect:  Flat  Cognitive:  Appropriate  Insight:  Improving  Engagement in Group:  Engaged  Modes of Intervention:  Clarification, Education and Support  Summary of Progress/Problems: Pt reported something good about the day is that he didn't hurt himself. Pt shares that he desperately needs a new start in a new place. Pt is open to extended treatment and would like to relocate. Pt shares that he has no supportive family, " they all have their own problems and don't want to bother with me".  Johann CapersMcNeil, Aleks Nawrot S 07/09/2015, 10:05 PM

## 2015-07-09 NOTE — Telephone Encounter (Signed)
Pt was a no show

## 2015-07-09 NOTE — [facility] Group Notes (Signed)
[facility] Group Notes:  (Nursing/MHT/Case Management/Adjunct)  Date:  07/09/2015  Time:  12:23 PM  Type of Therapy:  Nurse Education  Participation Level:  Did Not Attend    Mickie Baillizabeth O Iwenekha 07/09/2015, 12:23 PM

## 2015-07-09 NOTE — Progress Notes (Signed)
D:Patient in the dayroom on approach.  Patient patient does appear irritable but states he continues to be depressed.  Patient states he wants to get better.  Patient states he is working with the doctor to get his medications adjusted.  Patient states he is passive SI but verbally contracts for safety.  Patient denies HI and denies AVH.   A: Staff to monitor Q 15 mins for safety.  Encouragement and support offered.  Scheduled medications administered per orders. R: Patient remains safe on the unit.  Patient attended group tonight.  Patient visible on the unit.  Patient taking administered medications.

## 2015-07-10 LAB — TESTOSTERONE: TESTOSTERONE: 215 ng/dL — AB (ref 348–1197)

## 2015-07-10 LAB — TESTOSTERONE, FREE: Testosterone, Free: 8.9 pg/mL (ref 6.8–21.5)

## 2015-07-10 LAB — SEX HORMONE BINDING GLOBULIN: Sex Hormone Binding: 23.5 nmol/L (ref 16.5–55.9)

## 2015-07-10 LAB — T3: T3, Total: 135 ng/dL (ref 71–180)

## 2015-07-10 MED ORDER — TRAZODONE HCL 100 MG PO TABS
200.0000 mg | ORAL_TABLET | Freq: Every day | ORAL | Status: DC
  Administered 2015-07-10 – 2015-07-14 (×5): 200 mg via ORAL
  Filled 2015-07-10 (×6): qty 2

## 2015-07-10 MED ORDER — PRAZOSIN HCL 1 MG PO CAPS
1.0000 mg | ORAL_CAPSULE | Freq: Every day | ORAL | Status: DC
  Administered 2015-07-10 – 2015-07-12 (×3): 1 mg via ORAL
  Filled 2015-07-10 (×6): qty 1

## 2015-07-10 MED ORDER — LORAZEPAM 1 MG PO TABS
1.0000 mg | ORAL_TABLET | Freq: Three times a day (TID) | ORAL | Status: DC | PRN
  Administered 2015-07-10 – 2015-07-22 (×23): 1 mg via ORAL
  Filled 2015-07-10 (×23): qty 1

## 2015-07-10 NOTE — Progress Notes (Signed)
DAR NOTE: Patient presents with flat affect and depressed mood.  Denies auditory and visual hallucinations.  Rates depression at 9, hopelessness at 9, and anxiety at 10.  Describes energy level as low and concentration as poor.  Maintained on routine safety checks.  Medications given as prescribed.  Support and encouragement offered as needed.  Attended group and participated.  States goal for today is "feel better."  Patient up and in the dayroom.  Requested and received Tylenol 650 mg and Vistaril 50 mg for complain of pain and anxiety with good effect.

## 2015-07-10 NOTE — Tx Team (Signed)
Interdisciplinary Treatment Plan Update (Adult)  Date:  07/10/2015  Time Reviewed:  1:08 PM   Progress in Treatment: Attending groups: Intermittently  Participating in groups:  Yes, when he attends  Taking medication as prescribed:  Yes. Tolerating medication:  Yes. Family/Significant othe contact made:  SPE completed with pt; as he declined to consent to family contact.  Patient understands diagnosis:  Yes. and As evidenced by:  seeking treatment for Discussing patient identified problems/goals with staff:  Yes. Medical problems stabilized or resolved:  Yes. Denies suicidal/homicidal ideation: Yes. Issues/concerns per patient self-inventory:  Other:  Discharge Plan or Barriers: CSW assessing for appropriate referrals. Pt requested [facility] and [employer]. After this was provided, pt stated that he did not want to pursue either and cannot identify plan at this time. "I'm not sure where I want to go or what I want to do." Pt given information about how to get set up for [facility] services per his request. Pt continues to state that he does not know what to do from here and is resistant to receiving information for placement/[facility] houses/etc. He is now isolating in his room and not attending groups. 07/10/2015 1:08 PM   Reason for Continuation of Hospitalization: Depression Hallucinations Medication stabilization  Comments:  Mark Acosta is an 45 y.o. male who presents reporting symptoms of depression and suicidal ideation. Pt has a history of bipolar and has recent stressors of his mom dying 2 years ago and his dad currently dying. He says he has been increasingly depressed and not sleeping for the past 2 months, and "I pray to god to take me, I am tired of myself". He states that this weekend he relapsed after 14 months sobriety and used 1/2 gram of cocaine in an attempt for his "heart to blow up". He has also thought about slitting his wrist or hanging himself. In 2016, he  was about to hang himself with a rope near a tree, when his mom dies, but his father talked him out of it and he went to [facility] for help.Pt reports stopping medication a couple of months ago. He states he was on "something good" from [facility], and the MD at mental health took him off and put him on Trazadone for sleep, which did not work, so he stopped taking it. He hasn't gone back to Op treatment for 6 mo.Pt acknowledges symptoms including crying spells, social withdrawal, loss of interest in usual pleasures, decreased concentration, fatigue, irritability, decreased sleep, decreased appetite and feelings of hopelessness. PT denies homicidal ideation or history of violence. Pt admits to mild auditory and visual hallucinations "hearing bumps and seeing shadows". Pt denies alcohol or substance abuse--although UDS positive for cocaine. Pt lives with a roommate who drinks, and had lived for 10 months in a halfway house for men [facility]) and was doing well at that time. His supports include his family, but he states, "They have their own lives". Pt denies history of abuse and trauma. 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 Pt has fair insight and judgement. Diagnosis: Bipolar Disorder  Estimated length of stay:  3-5 days   New goal(s): to develop effective aftercare plan   Additional Comments:  Patient and CSW reviewed pt's identified goals and treatment plan. Patient verbalized understanding and agreed to treatment plan. CSW reviewed [facility] "Discharge Process and Patient Involvement" Form. Pt verbalized understanding of information provided and signed form.    Review of initial/current patient goals per problem  list:  1. Goal(s): Patient will participate in aftercare plan  Met: No.   Target date: at discharge  As evidenced by: Patient will participate within aftercare plan AEB aftercare provider and housing plan at discharge being identified.  4/4: CSW assessing  for appropriate referrals.   4/7 Multiple resources provided to pt; he continues to struggle with committing to a discharge plan. CSW continuing to assess.   2. Goal (s): Patient will exhibit decreased depressive symptoms and suicidal ideations.  Met: No.    Target date: at discharge  As evidenced by: Patient will utilize self rating of depression at 3 or below and demonstrate decreased signs of depression or be deemed stable for discharge by MD.  4/4: Pt rates depression as high. Denies SI/HI/AVH currently.   4/7: Pt rates depression as high. Passive SI at times (able to contract for safety on the unit).   3. Goal(s): Patient will demonstrate decreased signs of withdrawal due to substance abuse  Met:No-Goal progressing.  Target date:at discharge   As evidenced by: Patient will produce a CIWA/COWS score of 0, have stable vitals signs, and no symptoms of withdrawal.  4/4: Pt reports mild withdrawals with no COWS and high standing Pulse/Sitting BP.   4/7: Pt reports mild withdrawals with COWS of 1 and stable vitals. Goal progressing.  Attendees: Patient:   07/10/2015 1:08 PM   Family:   07/10/2015 1:08 PM   Physician:  Dr. Carlton Adam, MD 07/10/2015 1:08 PM   Nursing:   Jasmine Awe RN  07/10/2015 1:08 PM   Clinical Social Worker: Maxie Better, LCSW 07/10/2015 1:08 PM   Clinical Social Worker: Peri Maris LCSWA  07/10/2015 1:08 PM   Other:  Gerline Legacy Nurse Case Manager 07/10/2015 1:08 PM   Other:  Agustina Caroli NP 07/10/2015 1:08 PM   Other:   07/10/2015 1:08 PM   Other:  07/10/2015 1:08 PM   Other:  07/10/2015 1:08 PM   Other:  07/10/2015 1:08 PM    07/10/2015 1:08 PM    07/10/2015 1:08 PM    07/10/2015 1:08 PM    07/10/2015 1:08 PM    Scribe for Treatment Team:   Maxie Better, LCSW 07/10/2015 1:08 PM

## 2015-07-10 NOTE — [facility] Group Notes (Signed)
[facility] LCSW Group Therapy  07/10/2015 1:10 PM  Type of Therapy:  Group Therapy  Participation Level:  Active  Participation Quality:  Attentive  Affect:  Appropriate  Cognitive:  Alert and Oriented  Insight:  Improving  Engagement in Therapy:  Improving  Modes of Intervention:  Confrontation, Discussion, Education, Exploration, Problem-solving, Rapport Building, Socialization and Support  Summary of Progress/Problems: Feelings around Relapse. Group members discussed the meaning of relapse and shared personal stories of relapse, how it affected them and others, and how they perceived themselves during this time. Group members were encouraged to identify triggers, warning signs and coping skills used when facing the possibility of relapse. Social supports were discussed and explored in detail. Post Acute Withdrawal Syndrome (handout provided) was introduced and examined. Pt's were encouraged to ask questions, talk about key points associated with PAWS, and process this information in terms of relapse prevention. Mark Acosta was attentive and engaged during today's processing group. He shared how PAWS has affected his life and how he has lost motivation to continue on. "I have a hard time opening up but I know that I have to start to get better."   Smart, Mark Lothamer LCSW 07/10/2015, 1:10 PM

## 2015-07-10 NOTE — [facility] Group Notes (Signed)
Pt attended AA meeting.  Corneshia Hines, MHT 

## 2015-07-10 NOTE — [facility] Group Notes (Signed)
[facility]   07/10/2015 1:09 PM  Participation Quality:  Invited-DID NOT ATTEND. Pt chose to remain in bed.   Smart, Mark Pinheiro LCSW

## 2015-07-10 NOTE — Progress Notes (Signed)
[facility] MD Progress Note  07/10/2015 4:36 PM Jemery Stacey  MRN:  [id number] Subjective:  Mark Acosta is still endorsing anxiety depression, continues to state that he has suicidal ruminations as does not want to keep feeling like this, he would rather be dead. He continues to endorse a history of mood fluctuations apart from his use of cocaine and outside events. Endorses that he is having a lot of anxiety and the vistaril in not helping Principal Problem: Severe recurrent major depression without psychotic features (HCC) Diagnosis:   Patient Active Problem List   Diagnosis Date Noted  . Severe recurrent major depression without psychotic features (HCC) [F33.2] 07/07/2015  . Cocaine-induced mood disorder (HCC) [F14.94] 07/06/2015  . Reflux esophagitis [K21.0]   . Diverticulosis of large intestine without hemorrhage [K57.30]   . Hematochezia [K92.1]   . Heme + stool [R19.5] 05/11/2015  . Rectal bleeding [K62.5] 05/11/2015  . Morbid obesity (HCC) [E66.01] 01/24/2015  . Type II diabetes mellitus, uncontrolled (HCC) [E11.65] 01/20/2015  . Essential hypertension, benign [I10] 01/20/2015  . Hyperlipemia [E78.5] 01/20/2015   Total Time spent with patient: 20 minutes  Past Psychiatric History: see admission H and P  Past Medical History:  Past Medical History  Diagnosis Date  . Gout   . Diverticula of colon   . Substance abuse     hx etoh, marijuana, cocaine. quit 02/2014  . Diabetes mellitus without complication (HCC)     boarderline    Past Surgical History  Procedure Laterality Date  . None to date      As of 05/11/15  . Colonoscopy with propofol N/A 06/04/2015    RMR: internal hemorrhoids/anal papilla  . Esophagogastroduodenoscopy (egd) with propofol N/A 06/04/2015    RMR: mild erosive reflux   Family History:  Family History  Problem Relation Age of Onset  . Colon cancer Neg Hx    Family Psychiatric  History: See admission H and P Social History:  History  Alcohol Use No    Comment:  hx alcoholism. Now only a drink on special occasions (2-3 times a year)     History  Drug Use  . Yes  . Special: Cocaine, Marijuana    Social History   Social History  . Marital Status: Single    Spouse Name: N/A  . Number of Children: N/A  . Years of Education: N/A   Social History Main Topics  . Smoking status: Never Smoker   . Smokeless tobacco: Never Used  . Alcohol Use: No     Comment: hx alcoholism. Now only a drink on special occasions (2-3 times a year)  . Drug Use: Yes    Special: Cocaine, Marijuana  . Sexual Activity: Not Asked   Other Topics Concern  . None   Social History Narrative   Additional Social History:    Pain Medications: denies currently Prescriptions: denies currently Over the Counter: denies currently History of alcohol / drug use?: Yes Longest period of sobriety (when/how long): 14 months Negative Consequences of Use: Financial, Personal relationships Withdrawal Symptoms: Other (Comment) (None reported) Name of Substance 1: cocaine 1 - Age of First Use: unk 1 - Amount (size/oz): 1/2 oz 1 - Duration: 1 time 1 - Last Use / Amount: past weekend                  Sleep: Fair  Appetite:  Fair  Current Medications: Current Facility-Administered Medications  Medication Dose Route Frequency Provider Last Rate Last Dose  . acetaminophen (TYLENOL) tablet 650  mg  650 mg Oral Q6H PRN Thermon Leyland, NP   650 mg at 07/10/15 1042  . alum & mag hydroxide-simeth (MAALOX/MYLANTA) 200-200-20 MG/5ML suspension 30 mL  30 mL Oral Q4H PRN Thermon Leyland, NP      . gabapentin (NEURONTIN) capsule 300 mg  300 mg Oral TID Rachael Fee, MD   300 mg at 07/10/15 1205  . hydrOXYzine (ATARAX/VISTARIL) tablet 50 mg  50 mg Oral QHS PRN Thermon Leyland, NP   50 mg at 07/09/15 2129  . hydrOXYzine (ATARAX/VISTARIL) tablet 50 mg  50 mg Oral Q6H PRN Rachael Fee, MD   50 mg at 07/10/15 1043  . lamoTRIgine (LAMICTAL) tablet 25 mg  25 mg Oral Daily Rachael Fee, MD    25 mg at 07/10/15 0834  . lisinopril (PRINIVIL,ZESTRIL) tablet 20 mg  20 mg Oral Daily Rachael Fee, MD   20 mg at 07/10/15 0834  . magnesium hydroxide (MILK OF MAGNESIA) suspension 30 mL  30 mL Oral Daily PRN Thermon Leyland, NP      . metFORMIN (GLUCOPHAGE-XR) 24 hr tablet 500 mg  500 mg Oral Q breakfast Rachael Fee, MD   500 mg at 07/10/15 0800  . metoprolol (LOPRESSOR) tablet 50 mg  50 mg Oral BID Rachael Fee, MD   50 mg at 07/10/15 [phone]  . pravastatin (PRAVACHOL) tablet 20 mg  20 mg Oral q1800 Rachael Fee, MD   20 mg at 07/09/15 2129  . prazosin (MINIPRESS) capsule 1 mg  1 mg Oral QHS Rachael Fee, MD      . traZODone (DESYREL) tablet 200 mg  200 mg Oral QHS Rachael Fee, MD        Lab Results:  Results for orders placed or performed during the hospital encounter of 07/06/15 (from the past 48 hour(s))  Testosterone     Status: Abnormal   Collection Time: 07/09/15  6:20 AM  Result Value Ref Range   Testosterone 215 (L) 348 - 1197 ng/dL   Comment, Testosterone Comment     Comment: (NOTE) Adult male reference interval is based on a population of lean males up to 43 years old. Performed At: [facility] [address] [place], [place] [id number] Mila Homer MD [id number] Performed at [facility]   Sex hormone binding globulin     Status: None   Collection Time: 07/09/15  6:20 AM  Result Value Ref Range   Sex Hormone Binding 23.5 16.5 - 55.9 nmol/L    Comment: (NOTE) Performed At: [facility] [address] [place], [place] [id number] Mila Homer MD [id number] Performed at [facility]   TSH     Status: None   Collection Time: 07/09/15  7:00 PM  Result Value Ref Range   TSH 1.475 0.350 - 4.500 uIU/mL    Comment: Performed at [facility]  T3     Status: None   Collection Time: 07/09/15  7:00 PM  Result Value Ref Range   T3, Total 135 71 - 180 ng/dL    Comment: (NOTE) Performed  At: [facility] [address] [place], [place] [id number] Mila Homer MD [id number] Performed at [facility]     Blood Alcohol level:  Lab Results  Component Value Date   [facility] <5 07/06/2015    Physical Findings: AIMS: Facial and Oral Movements Muscles of Facial Expression: None, normal Lips and Perioral Area:  None, normal Jaw: None, normal Tongue: None, normal,Extremity Movements Upper (arms, wrists, hands, fingers): None, normal Lower (legs, knees, ankles, toes): None, normal, Trunk Movements Neck, shoulders, hips: None, normal, Overall Severity Severity of abnormal movements (highest score from questions above): None, normal Incapacitation due to abnormal movements: None, normal Patient's awareness of abnormal movements (rate only patient's report): No Awareness, Dental Status Current problems with teeth and/or dentures?: No Does patient usually wear dentures?: No  CIWA:  CIWA-Ar Total: 1 COWS:  COWS Total Score: 2  Musculoskeletal: Strength & Muscle Tone: within normal limits Gait & Station: normal Patient leans: normal  Psychiatric Specialty Exam: Review of Systems  Constitutional: Positive for malaise/fatigue.  HENT: Negative.   Eyes: Negative.   Respiratory: Negative.   Cardiovascular: Negative.   Gastrointestinal: Negative.   Genitourinary: Negative.   Musculoskeletal: Negative.   Skin: Negative.   Neurological: Negative.   Endo/Heme/Allergies: Negative.   Psychiatric/Behavioral: Positive for depression, suicidal ideas and substance abuse. The patient is nervous/anxious.     Blood pressure 129/81, pulse 79, temperature 98.2 F (36.8 C), temperature source Oral, resp. rate 24, height 5\' 6"  (1.676 m), weight 127.007 kg (280 lb), SpO2 98 %.Body mass index is 45.21 kg/(m^2).  General Appearance: Fairly Groomed  Patent attorneyye Contact::  Fair  Speech:  Clear and Coherent and Slow  Volume:  Decreased  Mood:  Anxious and Depressed   Affect:  anxious worried  Thought Process:  Coherent and Goal Directed  Orientation:  Full (Time, Place, and Person)  Thought Content:  symptoms events worries concerns  Suicidal Thoughts:  Yes.  without intent/plan  Homicidal Thoughts:  No  Memory:  Immediate;   Fair Recent;   Fair Remote;   Fair  Judgement:  Fair  Insight:  Present and Shallow  Psychomotor Activity:  Restlessness  Concentration:  Fair  Recall:  FiservFair  Fund of Knowledge:Fair  Language: Fair  Akathisia:  No  Handed:  Right  AIMS (if indicated):     Assets:  Desire for Improvement  ADL's:  Intact  Cognition: WNL  Sleep:  Number of Hours: 6.25   Treatment Plan Summary: Daily contact with patient to assess and evaluate symptoms and progress in treatment and Medication management Supportive approach/coping skills Cocaine dependence; work a relapse prevention plan Mood instability; continue to work with the Lamictal and optimize dose response.   Anxiety agitation; Neurontin ( off label) and will make Ativan available on a PRN basis  Nightmares; will use minipress 1 mg what he has used successfully in the past Low total testosterone; will check  the free testosterone before making a plan to address Work with CBT/mindfulness Ranyia Witting A, MD 07/10/2015, 4:36 PM

## 2015-07-11 MED ORDER — LAMOTRIGINE 25 MG PO TABS: 25.0000 mg | ORAL_TABLET | Freq: Every day | ORAL | Status: DC

## 2015-07-11 MED ORDER — LISINOPRIL 20 MG PO TABS: 20.0000 mg | ORAL_TABLET | Freq: Every day | ORAL | Status: DC

## 2015-07-11 MED ORDER — GABAPENTIN 300 MG PO CAPS: 300.0000 mg | ORAL_CAPSULE | Freq: Three times a day (TID) | ORAL | Status: DC

## 2015-07-11 MED ORDER — LAMOTRIGINE 25 MG PO TABS
25.0000 mg | ORAL_TABLET | Freq: Two times a day (BID) | ORAL | Status: DC
  Administered 2015-07-11 – 2015-07-22 (×22): 25 mg via ORAL
  Filled 2015-07-11 (×24): qty 1

## 2015-07-11 MED ORDER — PRAZOSIN HCL 1 MG PO CAPS: 1.0000 mg | ORAL_CAPSULE | Freq: Every day | ORAL | Status: DC

## 2015-07-11 MED ORDER — HYDROXYZINE HCL 50 MG PO TABS: 50.0000 mg | ORAL_TABLET | Freq: Every evening | ORAL | Status: DC | PRN

## 2015-07-11 MED ORDER — PRAVASTATIN SODIUM 20 MG PO TABS: 20.0000 mg | ORAL_TABLET | Freq: Every day | ORAL | Status: DC

## 2015-07-11 MED ORDER — TRAZODONE HCL 100 MG PO TABS: 200.0000 mg | ORAL_TABLET | Freq: Every day | ORAL | Status: DC

## 2015-07-11 NOTE — Progress Notes (Signed)
D.  Pt flat but pleasant on approach, denies complaints at this time.  Positive for evening wrap up group, interacting appropriately with peers on the unit.  Denies SI/HI/hallucinatinons at this time, but did express passive SI to day shift.  A.  Support and encouragement offered, medications given as ordered  R.  Pt remains safe on the unit, will continue to monitor.

## 2015-07-11 NOTE — Discharge Summary (Signed)
Physician Discharge Summary Note  Patient:  Mark Acosta is an 45 y.o., male MRN:  [id number] DOB:  01/23/1971 Patient phone:  [phone] (home)  Patient address:   [place] [address],  Total Time spent with patient: 30 minutes  Date of Admission:  07/06/2015 Date of Discharge: 07/11/2015  Reason for Admission: PER HPI-45 Y/O male who states a year ago he lost his mother. He got very depressed suicidal. He went to [facility]. Now recently his father was diagnosed with lung cancer. He got very depressed and the depression got worst when his father had a bad reaction to chemotherapy. States he is adopted. He met his biological family but does not know anything about their medical history. He relapsed this weekend on cocaine. States he was trying to kill himself by overdosing on cocaine. Mark Acosta is an 45 y.o. male who presents reporting symptoms of depression and suicidal ideation. Pt has a history of bipolar and has recent stressors of his mom dying 2 years ago and his dad currently dying. He says he has been increasingly depressed and not sleeping for the past 2 months, and "I pray to god to take me, I am tired of myself". He states that this weekend he relapsed after 14 months sobriety and used 1/2 gram of cocaine in an attempt for his "heart to blow up". He has also thought about slitting his wrist or hanging himself. In 2016, he was about to hang himself with a rope near a tree, when his mom die, but his father talked him out of it and he want to [facility] [place] for help.Pt reports stopping medication a couple of months ago. He states he was on "something good" from [facility], and the MD at mental health took him off and put him on Trazadone for sleep, which did not work, so he stopped taking it. He hasn't gone back to Op treatment for 6 mo. Pt acknowledges symptoms including crying spells, social withdrawal, loss of interest in usual pleasures, decreased concentration, fatigue,  irritability, decreased sleep, decreased appetite and feelings of hopelessness. PT denies homicidal ideation or history of violence. Pt admits to mild auditory and visual hallucinations "hearing bumps and seeing shadows". Pt denies alcohol or substance abuse.   Principal Problem: Severe recurrent major depression without psychotic features [facility]) Discharge Diagnoses: Patient Active Problem List   Diagnosis Date Noted  . Severe recurrent major depression without psychotic features ([place]) [F33.2] 07/07/2015  . Cocaine-induced mood disorder ([place]) [F14.94] 07/06/2015  . Reflux esophagitis [K21.0]   . Diverticulosis of large intestine without hemorrhage [K57.30]   . Hematochezia [K92.1]   . Heme + stool [R19.5] 05/11/2015  . Rectal bleeding [K62.5] 05/11/2015  . Morbid obesity ([place]) [E66.01] 01/24/2015  . Type II diabetes mellitus, uncontrolled ([place]) [E11.65] 01/20/2015  . Essential hypertension, benign [I10] 01/20/2015  . Hyperlipemia [E78.5] 01/20/2015    Past Psychiatric History: See Above  Past Medical History:  Past Medical History  Diagnosis Date  . Gout   . Diverticula of colon   . Substance abuse     hx etoh, marijuana, cocaine. quit 02/2014  . Diabetes mellitus without complication ([place])     boarderline    Past Surgical History  Procedure Laterality Date  . None to date      As of 05/11/15  . Colonoscopy with propofol N/A 06/04/2015    RMR: internal hemorrhoids/anal papilla  . Esophagogastroduodenoscopy (egd) with propofol N/A 06/04/2015    RMR: mild erosive reflux  Family History:  Family History  Problem Relation Age of Onset  . Colon cancer Neg Hx    Family Psychiatric  History: See Above Social History:  History  Alcohol Use No    Comment: hx alcoholism. Now only a drink on special occasions (2-3 times a year)     History  Drug Use  . Yes  . Special: Cocaine, Marijuana    Social History   Social History  . Marital Status: Single    Spouse Name: N/A  .  Number of Children: N/A  . Years of Education: N/A   Social History Main Topics  . Smoking status: Never Smoker   . Smokeless tobacco: Never Used  . Alcohol Use: No     Comment: hx alcoholism. Now only a drink on special occasions (2-3 times a year)  . Drug Use: Yes    Special: Cocaine, Marijuana  . Sexual Activity: Not Asked   Other Topics Concern  . None   Social History Narrative    Hospital Course: Mark Acosta was admitted for Severe recurrent major depression without psychotic features ([place]) and crisis management.  Pt was treated discharged with the medications listed below under Medication List.  Medical problems were identified and treated as needed.  Home medications were restarted as appropriate.  Improvement was monitored by observation and Annamary Carolin 's daily report of symptom reduction.  Emotional and mental status was monitored by daily self-inventory reports completed by Annamary Carolin and clinical staff.         Mark Acosta was evaluated by the treatment team for stability and plans for continued recovery upon discharge. Mark Acosta 's motivation was an integral factor for scheduling further treatment. Employment, transportation, bed availability, health status, family support, and any pending legal issues were also considered during hospital stay. Pt was offered further treatment options upon discharge including but not limited to Residential, Intensive Outpatient, and Outpatient treatment.  Mark Acosta will follow up with the services as listed below under Follow Up Information.     Upon completion of this admission the patient was both mentally and medically stable for discharge denying suicidal/homicidal ideation, auditory/visual/tactile hallucinations, delusional thoughts and paranoia.    Annamary Carolin responded well to treatment with Neurontin, lamictal, and minipress, trazodone without adverse effects. Pt demonstrated improvement without reported or observed adverse  effects to the point of stability appropriate for outpatient management. Pertinent labs include: Testosterone, Hemoglobin A1c7.2 elevated. Lipid and CMP for which outpatient follow-up is necessary for lab recheck as mentioned below. Reviewed CBC, CMP, BAL, and UDS+Cocain; all unremarkable aside from noted exceptions.   Physical Findings: AIMS: Facial and Oral Movements Muscles of Facial Expression: None, normal Lips and Perioral Area: None, normal Jaw: None, normal Tongue: None, normal,Extremity Movements Upper (arms, wrists, hands, fingers): None, normal Lower (legs, knees, ankles, toes): None, normal, Trunk Movements Neck, shoulders, hips: None, normal, Overall Severity Severity of abnormal movements (highest score from questions above): None, normal Incapacitation due to abnormal movements: None, normal Patient's awareness of abnormal movements (rate only patient's report): No Awareness, Dental Status Current problems with teeth and/or dentures?: No Does patient usually wear dentures?: No  CIWA:  CIWA-Ar Total: 1 COWS:  COWS Total Score: 2  Musculoskeletal: Strength & Muscle Tone: within normal limits Gait & Station: normal Patient leans: N/A  Psychiatric Specialty Exam: See SRA BY MD Review of Systems  Psychiatric/Behavioral: Negative for suicidal ideas and hallucinations. Depression: stable.  All other systems reviewed and are negative.   Blood  pressure 124/64, pulse 88, temperature 98.2 F (36.8 C), temperature source Oral, resp. rate 20, height 5' 6"  (1.676 m), weight 127.007 kg (280 lb), SpO2 98 %.Body mass index is 45.21 kg/(m^2).  Have you used any form of tobacco in the last 30 days? (Cigarettes, Smokeless Tobacco, Cigars, and/or Pipes): No  Has this patient used any form of tobacco in the last 30 days? (Cigarettes, Smokeless Tobacco, Cigars, and/or Pipes)  No  Blood Alcohol level:  Lab Results  Component Value Date   ETH <5 74/03/8785    Metabolic Disorder Labs:   Lab Results  Component Value Date   HGBA1C 7.2* 07/08/2015   MPG 160 07/08/2015   MPG 154* 01/20/2015   No results found for: PROLACTIN Lab Results  Component Value Date   CHOL 181 07/08/2015   TRIG 203* 07/08/2015   HDL 41 07/08/2015   CHOLHDL 4.4 07/08/2015   VLDL 41* 07/08/2015   LDLCALC 99 07/08/2015   LDLCALC 51 01/20/2015    See Psychiatric Specialty Exam and Suicide Risk Assessment completed by Attending Physician prior to discharge.  Discharge destination:  Home  Is patient on multiple antipsychotic therapies at discharge:  No   Has Patient had three or more failed trials of antipsychotic monotherapy by history:  No  Recommended Plan for Multiple Antipsychotic Therapies: NA  Discharge Instructions    Activity as tolerated - No restrictions    Complete by:  As directed      Diet Carb Modified    Complete by:  As directed      Discharge instructions    Complete by:  As directed   Take all medications as prescribed. Keep all follow-up appointments as scheduled.  Do not consume alcohol or use illegal drugs while on prescription medications. Report any adverse effects from your medications to your primary care provider promptly.  In the event of recurrent symptoms or worsening symptoms, call 911, a crisis hotline, or go to the nearest emergency department for evaluation.            Medication List    STOP taking these medications        acetaminophen 500 MG tablet  Commonly known as:  TYLENOL     ibuprofen 600 MG tablet  Commonly known as:  ADVIL,MOTRIN     lovastatin 20 MG tablet  Commonly known as:  MEVACOR  Replaced by:  pravastatin 20 MG tablet      TAKE these medications      Indication   gabapentin 300 MG capsule  Commonly known as:  NEURONTIN  Take 1 capsule (300 mg total) by mouth 3 (three) times daily.   Indication:  mood stablilzation     hydrOXYzine 50 MG tablet  Commonly known as:  ATARAX/VISTARIL  Take 1 tablet (50 mg total) by  mouth at bedtime as needed (sleep).   Indication:  Sedation     lamoTRIgine 25 MG tablet  Commonly known as:  LAMICTAL  Take 1 tablet (25 mg total) by mouth daily.   Indication:  mood stablilzation     lisinopril 20 MG tablet  Commonly known as:  PRINIVIL,ZESTRIL  Take 1 tablet (20 mg total) by mouth daily.   Indication:  High Blood Pressure     metFORMIN 500 MG 24 hr tablet  Commonly known as:  GLUCOPHAGE-XR  TAKE 1 Tablet BY MOUTH ONCE DAILY      metoprolol 50 MG tablet  Commonly known as:  LOPRESSOR  Take 1 tablet (50 mg total)  by mouth 2 (two) times daily.   Indication:  High Blood Pressure     pravastatin 20 MG tablet  Commonly known as:  PRAVACHOL  Take 1 tablet (20 mg total) by mouth daily at 6 PM.   Indication:  cardo     prazosin 1 MG capsule  Commonly known as:  MINIPRESS  Take 1 capsule (1 mg total) by mouth at bedtime.   Indication:  High Blood Pressure     traZODone 100 MG tablet  Commonly known as:  DESYREL  Take 2 tablets (200 mg total) by mouth at bedtime.   Indication:  Trouble Sleeping, mood stablilzation           Follow-up Information    Follow up with Tamela Gammon .      Follow-up recommendations:  Activity:  as tolerated Diet:  heart healthy  Comments: Take all medications as prescribed. Keep all follow-up appointments as scheduled.  Do not consume alcohol or use illegal drugs while on prescription medications. Report any adverse effects from your medications to your primary care provider promptly.  In the event of recurrent symptoms or worsening symptoms, call 911, a crisis hotline, or go to the nearest emergency department for evaluation.   Signed: Derrill Center, NP 07/11/2015, 8:14 AM  I personally assessed the patient and formulated the plan Geralyn Flash A. Sabra Heck, M.D.

## 2015-07-11 NOTE — [facility] Group Notes (Signed)
[facility] Group Notes:  (Clinical Social Work)   01/31/2015     10:00-11:00AM  Summary of Progress/Problems:   In today's process group a decisional balance exercise was used to explore in depth the perceived benefits and costs of alcohol and drugs, as well as the  benefits and costs of replacing these with healthy coping skills.  Patients listed healthy and unhealthy coping techniques, determining with CSW guidance that unhealthy coping techniques work initially, but eventually become harmful.  Motivational Interviewing and the whiteboard were utilized for the exercises.  The patient was late to group but once he arrived was one of the more thoughtful and participating patients.  He was able to understand the topic immediately and made appropriate comments.  Type of Therapy:  Group Therapy - Process   Participation Level:  Active  Participation Quality:  Attentive and Sharing  Affect:  Blunted  Cognitive:  Appropriate and Oriented  Insight:  Engaged  Engagement in Therapy:  Engaged  Modes of Intervention:  Education, Motivational Interviewing  Ambrose MantleMareida Grossman-Orr, LCSW 07/11/2015, 12:17 PM

## 2015-07-11 NOTE — Progress Notes (Signed)
DAR NOTE: Patient presents with flat affect and depressed mood.  Denies auditory and visual hallucinations.  Reports suicidal thoughts on self inventory form but verbally contracts for safety during nursing assessment.  Rates depression at 8, hopelessness at 10, and anxiety at 10.  Described energy level as low and concentration as poor.  Maintained on routine safety checks.  Medications given as prescribed.  Support and encouragement offered as needed.  Attended group and participated.  States goal for today is "feel better."  Patient observed socializing with peers in the dayroom.

## 2015-07-11 NOTE — Progress Notes (Signed)
[facility] MD Progress Note  07/11/2015 8:44 PM Mark Acosta  MRN:  [id number] Subjective:  Mark Acosta is still endorsing anxiety depression. States the depression is worst than it has ever been. States he can hardly move. He feels his arms and legs are heavier.  continues to state that he has suicidal ruminations as does not want to keep feeling like this, he would rather be dead. He hopes there is something wrong with his testosterone as at least it can be replaced. Worried as of when the Ativan goes away Principal Problem: Severe recurrent major depression without psychotic features (HCC) Diagnosis:   Patient Active Problem List   Diagnosis Date Noted  . Severe recurrent major depression without psychotic features (HCC) [F33.2] 07/07/2015  . Cocaine-induced mood disorder (HCC) [F14.94] 07/06/2015  . Reflux esophagitis [K21.0]   . Diverticulosis of large intestine without hemorrhage [K57.30]   . Hematochezia [K92.1]   . Heme + stool [R19.5] 05/11/2015  . Rectal bleeding [K62.5] 05/11/2015  . Morbid obesity (HCC) [E66.01] 01/24/2015  . Type II diabetes mellitus, uncontrolled (HCC) [E11.65] 01/20/2015  . Essential hypertension, benign [I10] 01/20/2015  . Hyperlipemia [E78.5] 01/20/2015   Total Time spent with patient: 20 minutes  Past Psychiatric History: see admission H and P  Past Medical History:  Past Medical History  Diagnosis Date  . Gout   . Diverticula of colon   . Substance abuse     hx etoh, marijuana, cocaine. quit 02/2014  . Diabetes mellitus without complication (HCC)     boarderline    Past Surgical History  Procedure Laterality Date  . None to date      As of 05/11/15  . Colonoscopy with propofol N/A 06/04/2015    RMR: internal hemorrhoids/anal papilla  . Esophagogastroduodenoscopy (egd) with propofol N/A 06/04/2015    RMR: mild erosive reflux   Family History:  Family History  Problem Relation Age of Onset  . Colon cancer Neg Hx    Family Psychiatric  History: See  admission H and P Social History:  History  Alcohol Use No    Comment: hx alcoholism. Now only a drink on special occasions (2-3 times a year)     History  Drug Use  . Yes  . Special: Cocaine, Marijuana    Social History   Social History  . Marital Status: Single    Spouse Name: N/A  . Number of Children: N/A  . Years of Education: N/A   Social History Main Topics  . Smoking status: Never Smoker   . Smokeless tobacco: Never Used  . Alcohol Use: No     Comment: hx alcoholism. Now only a drink on special occasions (2-3 times a year)  . Drug Use: Yes    Special: Cocaine, Marijuana  . Sexual Activity: Not Asked   Other Topics Concern  . None   Social History Narrative   Additional Social History:    Pain Medications: denies currently Prescriptions: denies currently Over the Counter: denies currently History of alcohol / drug use?: Yes Longest period of sobriety (when/how long): 14 months Negative Consequences of Use: Financial, Personal relationships Withdrawal Symptoms: Other (Comment) (None reported) Name of Substance 1: cocaine 1 - Age of First Use: unk 1 - Amount (size/oz): 1/2 oz 1 - Duration: 1 time 1 - Last Use / Amount: past weekend                  Sleep: Fair  Appetite:  Fair  Current Medications: Current Facility-Administered Medications  Medication Dose Route Frequency Provider Last Rate Last Dose  . acetaminophen (TYLENOL) tablet 650 mg  650 mg Oral Q6H PRN Thermon Leyland, NP   650 mg at 07/11/15 0631  . alum & mag hydroxide-simeth (MAALOX/MYLANTA) 200-200-20 MG/5ML suspension 30 mL  30 mL Oral Q4H PRN Thermon Leyland, NP      . gabapentin (NEURONTIN) capsule 300 mg  300 mg Oral TID Rachael Fee, MD   300 mg at 07/11/15 1624  . hydrOXYzine (ATARAX/VISTARIL) tablet 50 mg  50 mg Oral QHS PRN Thermon Leyland, NP   50 mg at 07/10/15 2153  . hydrOXYzine (ATARAX/VISTARIL) tablet 50 mg  50 mg Oral Q6H PRN Rachael Fee, MD   50 mg at 07/10/15 1043  .  lamoTRIgine (LAMICTAL) tablet 25 mg  25 mg Oral BID Rachael Fee, MD   25 mg at 07/11/15 1624  . lisinopril (PRINIVIL,ZESTRIL) tablet 20 mg  20 mg Oral Daily Rachael Fee, MD   20 mg at 07/11/15 0757  . LORazepam (ATIVAN) tablet 1 mg  1 mg Oral Q8H PRN Rachael Fee, MD   1 mg at 07/11/15 1624  . magnesium hydroxide (MILK OF MAGNESIA) suspension 30 mL  30 mL Oral Daily PRN Thermon Leyland, NP      . metFORMIN (GLUCOPHAGE-XR) 24 hr tablet 500 mg  500 mg Oral Q breakfast Rachael Fee, MD   500 mg at 07/11/15 0756  . metoprolol (LOPRESSOR) tablet 50 mg  50 mg Oral BID Rachael Fee, MD   50 mg at 07/11/15 0756  . pravastatin (PRAVACHOL) tablet 20 mg  20 mg Oral q1800 Rachael Fee, MD   20 mg at 07/10/15 2153  . prazosin (MINIPRESS) capsule 1 mg  1 mg Oral QHS Rachael Fee, MD   1 mg at 07/10/15 2153  . traZODone (DESYREL) tablet 200 mg  200 mg Oral QHS Rachael Fee, MD   200 mg at 07/10/15 2154    Lab Results:  No results found for this or any previous visit (from the past 48 hour(s)).  Blood Alcohol level:  Lab Results  Component Value Date   ETH <5 07/06/2015    Physical Findings: AIMS: Facial and Oral Movements Muscles of Facial Expression: None, normal Lips and Perioral Area: None, normal Jaw: None, normal Tongue: None, normal,Extremity Movements Upper (arms, wrists, hands, fingers): None, normal Lower (legs, knees, ankles, toes): None, normal, Trunk Movements Neck, shoulders, hips: None, normal, Overall Severity Severity of abnormal movements (highest score from questions above): None, normal Incapacitation due to abnormal movements: None, normal Patient's awareness of abnormal movements (rate only patient's report): No Awareness, Dental Status Current problems with teeth and/or dentures?: No Does patient usually wear dentures?: No  CIWA:  CIWA-Ar Total: 1 COWS:  COWS Total Score: 2  Musculoskeletal: Strength & Muscle Tone: within normal limits Gait & Station:  normal Patient leans: normal  Psychiatric Specialty Exam: Review of Systems  Constitutional: Positive for malaise/fatigue.  HENT: Negative.   Eyes: Negative.   Respiratory: Negative.   Cardiovascular: Negative.   Gastrointestinal: Negative.   Genitourinary: Negative.   Musculoskeletal: Negative.   Skin: Negative.   Neurological: Negative.   Endo/Heme/Allergies: Negative.   Psychiatric/Behavioral: Positive for depression, suicidal ideas and substance abuse. The patient is nervous/anxious.     Blood pressure 117/60, pulse 81, temperature 97.7 F (36.5 C), temperature source Oral, resp. rate 20, height  (1.676 m), weight 127.007 kg (280 lb),  SpO2 98 %.Body mass index is 45.21 kg/(m^2).  General Appearance: Fairly Groomed  Patent attorneyye Contact::  Fair  Speech:  Clear and Coherent and Slow  Volume:  Decreased  Mood:  Anxious and Depressed  Affect:  anxious worried  Thought Process:  Coherent and Goal Directed  Orientation:  Full (Time, Place, and Person)  Thought Content:  symptoms events worries concerns  Suicidal Thoughts:  Yes.  without intent/plan  Homicidal Thoughts:  No  Memory:  Immediate;   Fair Recent;   Fair Remote;   Fair  Judgement:  Fair  Insight:  Present and Shallow  Psychomotor Activity:  Restlessness  Concentration:  Fair  Recall:  [employer]Fair  Fund of Knowledge:Fair  Language: Fair  Akathisia:  No  Handed:  Right  AIMS (if indicated):     Assets:  Desire for Improvement  ADL's:  Intact  Cognition: WNL  Sleep:  Number of Hours: 6.75   Treatment Plan Summary: Daily contact with patient to assess and evaluate symptoms and progress in treatment and Medication management Supportive approach/coping skills Cocaine dependence; work a relapse prevention plan Mood instability; continue to work with the Lamictal and optimize dose response.   Anxiety agitation; Neurontin ( off label) and will make Ativan available on a PRN basis  Nightmares; will use minipress 1 mg what  he has used successfully in the past Depression: will discuss the use of an antidepressant Work with CBT/mindfulness Jeromiah Ohalloran A, MD 07/11/2015, 8:44 PM

## 2015-07-11 NOTE — Progress Notes (Signed)
[facility] Group Notes:  (Nursing/MHT/Case Management/Adjunct)  Date:  07/11/2015  Time:  2100  Type of Therapy:  wrap up group  Participation Level:  Active  Participation Quality:  Appropriate, Attentive, Sharing and Supportive  Affect:  Depressed  Cognitive:  Appropriate  Insight:  Improving  Engagement in Group:  Engaged  Modes of Intervention:  Clarification, Education and Support  Summary of Progress/Problems: Pt says he is grateful for another chance and that he has enjoyed meeting with the doctor today. Pt reports the doctor cares a lot and is reassuring that they will figure something out. Pt reports getting "stuck in my head" often and he has bad thoughts. Pt encouraged to talk to staff in order to get out of his head. Pt shared that he had been down a dark road of depression, drug use, anger, and isolation. Pt is focused on getting his medication straight at this time.    Marcille BuffyMcNeil, Zaide Kardell S 07/11/2015, 9:46 PM

## 2015-07-11 NOTE — Progress Notes (Signed)
D:Patient in the dayroom on approach.  Patient states his anxiety has been high today and he states Vistaril is not working.  Patient does appear to brighten today when speaking to Clinical research associatewriter.  Patient states he is passive SI verbally contracts for safety.  Patient denies HI and denies AVH. A: Staff to monitor Q 15 mins for safety.  Encouragement and support offered.  Scheduled medications administered per orders. R: Patient remains safe on the unit.  Patient attended group tonight.  Patient taking administered medications.  Patient visible on the unit tonight.

## 2015-07-11 NOTE — [facility] Group Notes (Signed)
[facility] Group Notes:  (Nursing/MHT/Case Management/Adjunct)  Date:  07/11/2015  Time:  2:37 PM  Type of Therapy:  Nurse Education  Participation Level:  Active  Participation Quality:  Appropriate and Attentive  Affect:  Appropriate  Cognitive:  Alert and Appropriate  Insight:  Appropriate, Good and Improving  Engagement in Group:  Engaged and Improving  Modes of Intervention:  Activity, Discussion, Education and Exploration  Summary of Progress/Problems: Topic was on healthy coping skills.  Discussed the importance of learning new healthy coping skills before discharging from the hospital.  Group encouraged to continue to utilize these skills even after discharge from the hospital.  Patient was attentive and receptive.      Mickie Baillizabeth O Iwenekha 07/11/2015, 2:37 PM

## 2015-07-12 MED ORDER — ESCITALOPRAM OXALATE 5 MG PO TABS
5.0000 mg | ORAL_TABLET | Freq: Every day | ORAL | Status: DC
  Administered 2015-07-12 – 2015-07-14 (×3): 5 mg via ORAL
  Filled 2015-07-12 (×4): qty 1

## 2015-07-12 MED ORDER — ESCITALOPRAM OXALATE 10 MG PO TABS
ORAL_TABLET | ORAL | Status: AC
  Filled 2015-07-12: qty 1

## 2015-07-12 NOTE — [facility] Group Notes (Signed)
[facility] Group Notes:  (Nursing/MHT/Case Management/Adjunct)  Date:  07/12/2015  Time:  1:51 PM  Type of Therapy:  Nurse Education  Participation Level:  Minimal  Participation Quality:  Drowsy  Affect:  Blunted  Cognitive:  Appropriate  Insight:  Appropriate  Engagement in Group:  Distracting and Lacking  Modes of Intervention:  Discussion  Summary of Progress/Problems:  Mark Acosta 07/12/2015, 1:51 PM

## 2015-07-12 NOTE — [facility] Group Notes (Signed)
[facility] Group Notes:  (Nursing/MHT/Case Management/Adjunct)  Date:  07/12/2015  Time:  1415pm   Type of Therapy:  Group completed by nursing students  Participation Level:  Did Not Attend  Participation Quality:  Did not attend  Affect:  Did not attend  Cognitive:  Did not attend  Insight:  None  Engagement in Group:  Did not attend  Modes of Intervention:  Activity  Summary of Progress/Problems: Patient did not attend group and remained in bed resting.  Jonathon BellowsBrittany G Bayle Calvo 07/12/2015, 3:37 PM

## 2015-07-12 NOTE — [facility] Group Notes (Signed)
[facility] Group Notes: (Clinical Social Work)   07/12/2015      Type of Therapy:  Group Therapy   Participation Level:  Did Not Attend despite MHT prompting   Ambrose MantleMareida Grossman-Orr, LCSW 07/12/2015, 11:09 AM

## 2015-07-12 NOTE — Progress Notes (Signed)
Patient ID: Mark Acosta, male   DOB: [birth year], 45 y.o.   MRN: [id number] [facility] MD Progress Note  07/12/2015 8:43 PM Mark Acosta  MRN:  [id number] Subjective:  Donal is still endorsing anxiety depression. States the depression is still bad States he can hardly move. He feels his arms and legs are heavier.  continues to state that he has suicidal ruminations as does not want to keep feeling like this, he would rather be dead. 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 Severe recurrent major depression without psychotic features (HCC) [F33.2] 07/07/2015  . Cocaine-induced mood disorder (HCC) [F14.94] 07/06/2015  . Reflux esophagitis [K21.0]   . Diverticulosis of large intestine without hemorrhage [K57.30]   . Hematochezia [K92.1]   . Heme + stool [R19.5] 05/11/2015  . Rectal bleeding [K62.5] 05/11/2015  . Morbid obesity (HCC) [E66.01] 01/24/2015  . Type II diabetes mellitus, uncontrolled (HCC) [E11.65] 01/20/2015  . Essential hypertension, benign [I10] 01/20/2015  . Hyperlipemia [E78.5] 01/20/2015   Total Time spent with patient: 20 minutes  Past Psychiatric History: see admission H and P  Past Medical History:  Past Medical History  Diagnosis Date  . Gout   . Diverticula of colon   . Substance abuse     hx etoh, marijuana, cocaine. quit 02/2014  . Diabetes mellitus without complication (HCC)     boarderline    Past Surgical History  Procedure Laterality Date  . None to date      As of 05/11/15  . Colonoscopy with propofol N/A 06/04/2015    RMR: internal hemorrhoids/anal papilla  . Esophagogastroduodenoscopy (egd) with propofol N/A 06/04/2015    RMR: mild  erosive reflux   Family History:  Family History  Problem Relation Age of Onset  . Colon cancer Neg Hx    Family Psychiatric  History: See admission H and P Social History:  History  Alcohol Use No    Comment: hx alcoholism. Now only a drink on special occasions (2-3 times a year)     History  Drug Use  . Yes  . Special: Cocaine, Marijuana    Social History   Social History  . Marital Status: Single    Spouse Name: N/A  . Number of Children: N/A  . Years of Education: N/A   Social History Main Topics  . Smoking status: Never Smoker   . Smokeless tobacco: Never Used  . Alcohol Use: No     Comment: hx alcoholism. Now only a drink on special occasions (2-3 times a year)  . Drug Use: Yes    Special: Cocaine, Marijuana  . Sexual Activity: Not Asked   Other Topics Concern  . None   Social History Narrative   Additional Social History:    Pain Medications: denies currently Prescriptions: denies currently Over the Counter: denies currently History of alcohol / drug use?: Yes Longest period of sobriety (when/how long): 14 months Negative Consequences of Use: Financial, Personal relationships Withdrawal Symptoms: Other (Comment) (None reported) Name of Substance 1: cocaine 1 - Age of First Use: unk 1 - Amount (size/oz): 1/2 oz 1 - Duration: 1 time 1 - Last Use / Amount: past weekend  Sleep: Fair  Appetite:  Fair  Current Medications: Current Facility-Administered Medications  Medication Dose Route Frequency Provider Last Rate Last Dose  . acetaminophen (TYLENOL) tablet 650 mg  650 mg Oral Q6H PRN Thermon Leyland, NP   650 mg at 07/12/15 0823  . alum & mag hydroxide-simeth (MAALOX/MYLANTA) 200-200-20 MG/5ML suspension 30 mL  30 mL Oral Q4H PRN Thermon Leyland, NP      . escitalopram (LEXAPRO) tablet 5 mg  5 mg Oral Daily Rachael Fee, MD   5 mg at 07/12/15 1716  . gabapentin (NEURONTIN) capsule 300 mg  300 mg Oral TID Rachael Fee, MD   300  mg at 07/12/15 1703  . hydrOXYzine (ATARAX/VISTARIL) tablet 50 mg  50 mg Oral QHS PRN Thermon Leyland, NP   50 mg at 07/11/15 2113  . hydrOXYzine (ATARAX/VISTARIL) tablet 50 mg  50 mg Oral Q6H PRN Rachael Fee, MD   50 mg at 07/10/15 1043  . lamoTRIgine (LAMICTAL) tablet 25 mg  25 mg Oral BID Rachael Fee, MD   25 mg at 07/12/15 1703  . lisinopril (PRINIVIL,ZESTRIL) tablet 20 mg  20 mg Oral Daily Rachael Fee, MD   20 mg at 07/12/15 [phone]  . LORazepam (ATIVAN) tablet 1 mg  1 mg Oral Q8H PRN Rachael Fee, MD   1 mg at 07/12/15 1948  . magnesium hydroxide (MILK OF MAGNESIA) suspension 30 mL  30 mL Oral Daily PRN Thermon Leyland, NP      . metFORMIN (GLUCOPHAGE-XR) 24 hr tablet 500 mg  500 mg Oral Q breakfast Rachael Fee, MD   500 mg at 07/12/15 9604  . metoprolol (LOPRESSOR) tablet 50 mg  50 mg Oral BID Rachael Fee, MD   50 mg at 07/12/15 5409  . pravastatin (PRAVACHOL) tablet 20 mg  20 mg Oral q1800 Rachael Fee, MD   20 mg at 07/11/15 2113  . prazosin (MINIPRESS) capsule 1 mg  1 mg Oral QHS Rachael Fee, MD   1 mg at 07/11/15 2113  . traZODone (DESYREL) tablet 200 mg  200 mg Oral QHS Rachael Fee, MD   200 mg at 07/11/15 2113    Lab Results:  No results found for this or any previous visit (from the past 48 hour(s)).  Blood Alcohol level:  Lab Results  Component Value Date   ETH <5 07/06/2015    Physical Findings: AIMS: Facial and Oral Movements Muscles of Facial Expression: None, normal Lips and Perioral Area: None, normal Jaw: None, normal Tongue: None, normal,Extremity Movements Upper (arms, wrists, hands, fingers): None, normal Lower (legs, knees, ankles, toes): None, normal, Trunk Movements Neck, shoulders, hips: None, normal, Overall Severity Severity of abnormal movements (highest score from questions above): None, normal Incapacitation due to abnormal movements: None, normal Patient's awareness of abnormal movements (rate only patient's report): No Awareness, Dental  Status Current problems with teeth and/or dentures?: No Does patient usually wear dentures?: No  CIWA:  CIWA-Ar Total: 1 COWS:  COWS Total Score: 2  Musculoskeletal: Strength & Muscle Tone: within normal limits Gait & Station: normal Patient leans: normal  Psychiatric Specialty Exam: Review of Systems  Constitutional: Positive for malaise/fatigue.  HENT: Negative.   Eyes: Negative.   Respiratory: Negative.   Cardiovascular: Negative.   Gastrointestinal: Negative.   Genitourinary: Negative.   Musculoskeletal: Negative.   Skin: Negative.   Neurological: Negative.   Endo/Heme/Allergies: Negative.   Psychiatric/Behavioral: Positive for depression, suicidal ideas and  substance abuse. The patient is nervous/anxious.     Blood pressure 128/85, pulse 81, temperature 97.9 F (36.6 C), temperature source Oral, resp. rate 20, height 5\' 6"  (1.676 m), weight 127.007 kg (280 lb), SpO2 98 %.Body mass index is 45.21 kg/(m^2).  General Appearance: Fairly Groomed  Patent attorneyye Contact::  Fair  Speech:  Clear and Coherent and Slow  Volume:  Decreased  Mood:  Anxious and Depressed  Affect:  anxious worried  Thought Process:  Coherent and Goal Directed  Orientation:  Full (Time, Place, and Person)  Thought Content:  symptoms events worries concerns  Suicidal Thoughts:  Yes.  without intent/plan  Homicidal Thoughts:  No  Memory:  Immediate;   Fair Recent;   Fair Remote;   Fair  Judgement:  Fair  Insight:  Present and Shallow  Psychomotor Activity:  Restlessness  Concentration:  Fair  Recall:  [employer]Fair  Fund of Knowledge:Fair  Language: Fair  Akathisia:  No  Handed:  Right  AIMS (if indicated):     Assets:  Desire for Improvement  ADL's:  Intact  Cognition: WNL  Sleep:  Number of Hours: 6   Treatment Plan Summary: Daily contact with patient to assess and evaluate symptoms and progress in treatment and Medication management Supportive approach/coping skills Cocaine dependence; work a relapse  prevention plan Mood instability; continue to work with the Lamictal and optimize dose response.   Anxiety agitation; Neurontin ( off label) and will make Ativan available on a PRN basis  Nightmares; will use minipress 1 mg what he has used successfully in the past Depression: will start Lexapro 5 with plans to increase to 10 and optimize response Work with CBT/mindfulness Jeidi Gilles A, MD 07/12/2015, 8:43 PM

## 2015-07-12 NOTE — Progress Notes (Signed)
D.  Pt flat on approach, very anxious.  Pt continues to endorse passive suicidal ideation.  Pt has been isolative and did receive bedtime medications as soon as possible.  A.  Support and encouragement offered, medication given as ordered.  R.  Pt remains safe on the unit, will continue to monitor.

## 2015-07-12 NOTE — Plan of Care (Signed)
Problem: Ineffective individual coping Goal: STG: Patient will remain free from self harm Outcome: Progressing Patient remains free from self harm. 15 minute checks continued per protocol for patient safety.   Problem: Alteration in mood Goal: LTG-Patient reports reduction in suicidal thoughts (Patient reports reduction in suicidal thoughts and is able to verbalize a safety plan for whenever patient is feeling suicidal)  Outcome: Not Progressing Patient continues to endorse suicidal ideation. Pt is able to verbally contract for safety, agrees not to harm self and agrees to come to staff with increased intensity of suicidal thoughts.  Problem: Alteration in thought process Goal: LTG-Patient verbalizes understanding importance med regimen (Patient verbalizes understanding of importance of medication regimen and need to continue outpatient care.)  Outcome: Progressing Patient verbalizes the importance of his medication regimen.  Problem: Diagnosis: Increased Risk For Suicide Attempt Goal: STG-Patient Will Attend All Groups On The Unit Outcome: Not Progressing Patient is not attending unit groups this shift.

## 2015-07-12 NOTE — Progress Notes (Signed)
D: Patient is alert. Pt's mood and affect are depressed, anxious, and flat. Pt denies HI and AVH. Pt reports passive suicidal thoughts. Pt complains of anxiety and lower back pain 8/10 with some relief from PRN medication. Pt rates depression 8/10, hopelessness 9/10, and anxiety 10/10. Pt states his goal for the day is to "feel better." Pt is not attending unit groups and remains in bed isolative the majority of the shift. A: Active listening by RN. Encouragement/Support provided to pt. PRN medication administered for pain and anxiety per providers orders (See MAR). Scheduled medications administered per providers orders (See MAR). 15 minute checks continued per protocol for patient safety.  R: Pt verbally contracts for safety, agrees not to harm self and agrees to come to staff with increased intensity of suicidal thoughts. Patient cooperative and receptive to nursing interventions. Pt remains safe.

## 2015-07-13 MED ORDER — QUETIAPINE FUMARATE 50 MG PO TABS
50.0000 mg | ORAL_TABLET | Freq: Three times a day (TID) | ORAL | Status: DC
  Administered 2015-07-13 – 2015-07-14 (×2): 50 mg via ORAL
  Filled 2015-07-13 (×5): qty 1

## 2015-07-13 MED ORDER — PRAZOSIN HCL 2 MG PO CAPS
2.0000 mg | ORAL_CAPSULE | Freq: Every day | ORAL | Status: DC
  Administered 2015-07-13 – 2015-07-21 (×9): 2 mg via ORAL
  Filled 2015-07-13 (×10): qty 1

## 2015-07-13 NOTE — Progress Notes (Signed)
The patient did not attend last evening's A.A. Meeting and elected to remain in his room. The patient stated that he felt "stressed out".

## 2015-07-13 NOTE — Progress Notes (Signed)
Adult Psychoeducational Group Note  Date:  07/13/2015 Time:  9:58 PM  Group Topic/Focus:  Wrap-Up Group:   The focus of this group is to help patients review their daily goal of treatment and discuss progress on daily workbooks.  Participation Level:  Did Not Attend  Participation Quality:  Did not attend  Affect:  Did not attend  Cognitive:  Did not attend  Insight: None  Engagement in Group:  Did not attend  Modes of Intervention:  Did not attend  Additional Comments:  Patient did not attend wrap up group tonight.   Taha Dimond L Damaria Vachon 07/13/2015, 9:58 PM

## 2015-07-13 NOTE — [facility] Group Notes (Signed)
[facility] LCSW Aftercare Discharge Planning Group Note  07/13/2015  8:45 AM  Participation Quality: Did Not Attend. Patient invited to participate but declined.  Mark Acosta, MSW, LCSW Clinical Social Worker [facility] [phone]   
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
## 2015-07-13 NOTE — Progress Notes (Signed)
[facility] MD Progress Note  07/13/2015 6:53 PM Mark Acosta  MRN:  [id number] Subjective:  Mark Acosta is still endorsing anxiety depression as well as suicidal ruminations. States the depression is still bad States he can hardly move. He feels his arms and legs are heavier.  continues to state that he has suicidal ruminations as does not want to keep feeling like this, he would rather be dead. 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 He states he wants to know what is wrong with him. He endorses a lot of anxiety, agitation and neither the vistaril or the Ativan are doing anything for him. He is intellectualizing. States he was able to feel better momentarily when he shared some of his experiences with another patient Principal Problem: Severe recurrent major depression without psychotic features (HCC) Diagnosis:   Patient Active Problem List   Diagnosis Date Noted  . Severe recurrent major depression without psychotic features (HCC) [F33.2] 07/07/2015  . Cocaine-induced mood disorder (HCC) [F14.94] 07/06/2015  . Reflux esophagitis [K21.0]   . Diverticulosis of large intestine without hemorrhage [K57.30]   . Hematochezia [K92.1]   . Heme + stool [R19.5] 05/11/2015  . Rectal bleeding [K62.5] 05/11/2015  . Morbid obesity (HCC) [E66.01] 01/24/2015  . Type II diabetes mellitus, uncontrolled (HCC) [E11.65] 01/20/2015  . Essential hypertension, benign [I10] 01/20/2015  . Hyperlipemia [E78.5] 01/20/2015   Total Time spent with patient: 20 minutes  Past Psychiatric History: see admission H and P  Past Medical History:  Past Medical History  Diagnosis Date  . Gout   . Diverticula of colon   . Substance abuse     hx etoh, marijuana, cocaine. quit 02/2014  . Diabetes mellitus without complication (HCC)     boarderline    Past  Surgical History  Procedure Laterality Date  . None to date      As of 05/11/15  . Colonoscopy with propofol N/A 06/04/2015    RMR: internal hemorrhoids/anal papilla  . Esophagogastroduodenoscopy (egd) with propofol N/A 06/04/2015    RMR: mild erosive reflux   Family History:  Family History  Problem Relation Age of Onset  . Colon cancer Neg Hx    Family Psychiatric  History: See admission H and P Social History:  History  Alcohol Use No    Comment: hx alcoholism. Now only a drink on special occasions (2-3 times a year)     History  Drug Use  . Yes  . Special: Cocaine, Marijuana    Social History   Social History  . Marital Status: Single    Spouse Name: N/A  . Number of Children: N/A  . Years of Education: N/A   Social History Main Topics  . Smoking status: Never Smoker   . Smokeless tobacco: Never Used  . Alcohol Use: No     Comment: hx alcoholism. Now only a drink on special occasions (2-3 times a year)  . Drug Use: Yes    Special: Cocaine, Marijuana  . Sexual Activity: Not Asked   Other Topics Concern  . None   Social History Narrative   Additional Social History:    Pain Medications: denies currently Prescriptions: denies currently Over the Counter: denies currently History of alcohol / drug use?: Yes Longest period of sobriety (when/how long): 14 months Negative Consequences of Use:  Financial, Personal relationships Withdrawal Symptoms: Other (Comment) (None reported) Name of Substance 1: cocaine 1 - Age of First Use: unk 1 - Amount (size/oz): 1/2 oz 1 - Duration: 1 time 1 - Last Use / Amount: past weekend                  Sleep: Poor (nightmares)  Appetite:  Fair  Current Medications: Current Facility-Administered Medications  Medication Dose Route Frequency Provider Last Rate Last Dose  . acetaminophen (TYLENOL) tablet 650 mg  650 mg Oral Q6H PRN Thermon Leyland, NP   650 mg at 07/13/15 0827  . alum & mag hydroxide-simeth (MAALOX/MYLANTA)  200-200-20 MG/5ML suspension 30 mL  30 mL Oral Q4H PRN Thermon Leyland, NP      . escitalopram (LEXAPRO) tablet 5 mg  5 mg Oral Daily Rachael Fee, MD   5 mg at 07/13/15 1610  . gabapentin (NEURONTIN) capsule 300 mg  300 mg Oral TID Rachael Fee, MD   300 mg at 07/13/15 1633  . hydrOXYzine (ATARAX/VISTARIL) tablet 50 mg  50 mg Oral QHS PRN Thermon Leyland, NP   50 mg at 07/12/15 2100  . hydrOXYzine (ATARAX/VISTARIL) tablet 50 mg  50 mg Oral Q6H PRN Rachael Fee, MD   50 mg at 07/10/15 1043  . lamoTRIgine (LAMICTAL) tablet 25 mg  25 mg Oral BID Rachael Fee, MD   25 mg at 07/13/15 1633  . lisinopril (PRINIVIL,ZESTRIL) tablet 20 mg  20 mg Oral Daily Rachael Fee, MD   20 mg at 07/13/15 0827  . LORazepam (ATIVAN) tablet 1 mg  1 mg Oral Q8H PRN Rachael Fee, MD   1 mg at 07/13/15 0827  . magnesium hydroxide (MILK OF MAGNESIA) suspension 30 mL  30 mL Oral Daily PRN Thermon Leyland, NP      . metFORMIN (GLUCOPHAGE-XR) 24 hr tablet 500 mg  500 mg Oral Q breakfast Rachael Fee, MD   500 mg at 07/13/15 0827  . metoprolol (LOPRESSOR) tablet 50 mg  50 mg Oral BID Rachael Fee, MD   50 mg at 07/13/15 0827  . pravastatin (PRAVACHOL) tablet 20 mg  20 mg Oral q1800 Rachael Fee, MD   20 mg at 07/12/15 2101  . prazosin (MINIPRESS) capsule 2 mg  2 mg Oral QHS Rachael Fee, MD      . QUEtiapine (SEROQUEL) tablet 50 mg  50 mg Oral TID Rachael Fee, MD   50 mg at 07/13/15 1633  . traZODone (DESYREL) tablet 200 mg  200 mg Oral QHS Rachael Fee, MD   200 mg at 07/12/15 2101    Lab Results:  No results found for this or any previous visit (from the past 48 hour(s)).  Blood Alcohol level:  Lab Results  Component Value Date   ETH <5 07/06/2015    Physical Findings: AIMS: Facial and Oral Movements Muscles of Facial Expression: None, normal Lips and Perioral Area: None, normal Jaw: None, normal Tongue: None, normal,Extremity Movements Upper (arms, wrists, hands, fingers): None, normal Lower (legs, knees,  ankles, toes): None, normal, Trunk Movements Neck, shoulders, hips: None, normal, Overall Severity Severity of abnormal movements (highest score from questions above): None, normal Incapacitation due to abnormal movements: None, normal Patient's awareness of abnormal movements (rate only patient's report): No Awareness, Dental Status Current problems with teeth and/or dentures?: No Does patient usually wear dentures?: No  CIWA:  CIWA-Ar Total: 1 COWS:  COWS Total  Score: 2  Musculoskeletal: Strength & Muscle Tone: within normal limits Gait & Station: normal Patient leans: normal  Psychiatric Specialty Exam: Review of Systems  Constitutional: Positive for malaise/fatigue.  HENT: Negative.   Eyes: Negative.   Respiratory: Negative.   Cardiovascular: Negative.   Gastrointestinal: Negative.   Genitourinary: Negative.   Musculoskeletal: Negative.   Skin: Negative.   Neurological: Negative.   Endo/Heme/Allergies: Negative.   Psychiatric/Behavioral: Positive for depression, suicidal ideas and substance abuse. The patient is nervous/anxious.     Blood pressure 140/81, pulse 81, temperature 97.9 F (36.6 C), temperature source Oral, resp. rate 16, height 5\' 6"  (1.676 m), weight 127.007 kg (280 lb), SpO2 98 %.Body mass index is 45.21 kg/(m^2).  General Appearance: Fairly Groomed  Patent attorneyye Contact::  Fair  Speech:  Clear and Coherent and Slow  Volume:  Decreased  Mood:  Anxious and Depressed  Affect:  anxious worried  Thought Process:  Coherent and Goal Directed  Orientation:  Full (Time, Place, and Person)  Thought Content:  symptoms events worries concerns  Suicidal Thoughts:  Yes.  without intent/plan  Homicidal Thoughts:  No  Memory:  Immediate;   Fair Recent;   Fair Remote;   Fair  Judgement:  Fair  Insight:  Present and Shallow  Psychomotor Activity:  Restlessness  Concentration:  Fair  Recall:  [employer]Fair  Fund of Knowledge:Fair  Language: Fair  Akathisia:  No  Handed:  Right   AIMS (if indicated):     Assets:  Desire for Improvement  ADL's:  Intact  Cognition: WNL  Sleep:  Number of Hours: 6.75   Treatment Plan Summary: Daily contact with patient to assess and evaluate symptoms and progress in treatment and Medication management Supportive approach/coping skills Cocaine dependence; work a relapse prevention plan Mood instability; continue to work with the Lamictal and optimize dose response.   Anxiety agitation; Neurontin ( off label) and will make Ativan available on a PRN basis. Will add Seroquel 50 as another option to help with the agitation Nightmares; will increase the minipress 2 mg what he has used successfully in the past Depression: will increase  Lexapro to  10 and optimize response Work with CBT/mindfulness Rachael FeeLUGO,Rollins Wrightson A, MD 07/13/2015, 6:53 PM

## 2015-07-13 NOTE — Plan of Care (Signed)
Problem: Diagnosis: Increased Risk For Suicide Attempt Goal: LTG-Patient Will Report Improved Mood and Deny Suicidal LTG (by discharge) Patient will report improved mood and deny suicidal ideation.  Outcome: Not Progressing Pt continues to endorse passive suicidal ideation.

## 2015-07-13 NOTE — [facility] Group Notes (Deleted)
[facility]  07/13/2015  8:45 AM  Participation Quality: Did Not Attend. Patient invited to participate but declined.  Samuella BruinKristin Hector Venne, MSW, LCSW Clinical Social Worker [facility] [phone]

## 2015-07-13 NOTE — [facility] Group Notes (Signed)
[facility] LCSW Group Therapy  07/13/2015   1:15 PM   Type of Therapy:  Group Therapy  Participation Level:  Active  Participation Quality:  Attentive, Sharing and Supportive  Affect:  Depressed and agitated  Cognitive:  Alert and Oriented  Insight:  Developing/Improving and Engaged  Engagement in Therapy:  Developing/Improving and Engaged  Modes of Intervention:  Clarification, Confrontation, Discussion, Education, Exploration, Limit-setting, Orientation, Problem-solving, Rapport Building, Dance movement psychotherapisteality Testing, Socialization and Support  Summary of Progress/Problems: The topic for group therapy was feelings about diagnosis.  Pt actively participated in group discussion on their past and current diagnosis and how they feel towards this.  Pt also identified how society and family members judge them, based on their diagnosis as well as stereotypes and stigmas.  Patient discussed relapsing after 14 months of sobriety and the guilt and anger that he feels. He expressed feeling hopeless and ruminated about negative aspects of his life and lack of social support. He identified his family as enabling his addiction. CSW and other group members provided patient with emotional support and encouragement.    Samuella BruinKristin Kalif Kattner, MSW, LCSW Clinical Social Worker [facility] [phone]

## 2015-07-13 NOTE — Progress Notes (Signed)
Recreation Therapy Notes  Date: 04.10.2017 Time: 9:30am Location: [address] Group Room   Group Topic: Stress Management  Goal Area(s) Addresses:  Patient will actively participate in stress management techniques presented during session.   Behavioral Response: Did not attend.   Marykay Lexenise L Denesia Donelan, [name]        Demauri Advincula L 07/13/2015 1:51 PM

## 2015-07-14 LAB — PSA: PSA: 0.15 ng/mL (ref 0.00–4.00)

## 2015-07-14 LAB — TESTOSTERONE, % FREE: TESTOSTERONE-% FREE: 1.6 % — AB (ref 0.2–0.7)

## 2015-07-14 MED ORDER — ESCITALOPRAM OXALATE 10 MG PO TABS
10.0000 mg | ORAL_TABLET | Freq: Every day | ORAL | Status: DC
  Administered 2015-07-15 – 2015-07-20 (×6): 10 mg via ORAL
  Filled 2015-07-14 (×7): qty 1

## 2015-07-14 MED ORDER — QUETIAPINE FUMARATE 100 MG PO TABS
100.0000 mg | ORAL_TABLET | Freq: Every day | ORAL | Status: DC
  Administered 2015-07-14: 100 mg via ORAL
  Filled 2015-07-14 (×2): qty 1

## 2015-07-14 NOTE — [facility] Group Notes (Signed)
[facility] LCSW Group Therapy 07/14/2015 1:15 PM Type of Therapy: Group Therapy Participation Level: Active  Participation Quality: Attentive, Sharing and Supportive  Affect: Depressed and Flat  Cognitive: Alert and Oriented  Insight: Developing/Improving and Engaged  Engagement in Therapy: Developing/Improving and Engaged  Modes of Intervention: Activity, Clarification, Confrontation, Discussion, Education, Exploration, Limit-setting, Orientation, Problem-solving, Rapport Building, Reality Testing, Socialization and Support  Summary of Progress/Problems: Patient was attentive and engaged with speaker from [employer]. Patient was attentive to speaker while they shared their story of dealing with mental health and overcoming it. Patient expressed interest in their programs and services and received information on their agency. Patient processed ways they can relate to the speaker.   Belva Koziel, LCSW Clinical Social Worker [facility] [phone]   

## 2015-07-14 NOTE — Progress Notes (Signed)
[facility] MD Progress Note  07/14/2015 11:51 AM Mark Acosta  MRN:  [id number] Subjective:  Mark Acosta continues to have a hard time. He continues to endorse severe depression with suicidal ruminations. States he just wants to have some hope that things can get better for him. States he is having some vivid dreams. He is still anergic hopes that if the testosterone is low again that we could start replacing Principal Problem: Severe recurrent major depression without psychotic features [facility]) Diagnosis:   Patient Active Problem List   Diagnosis Date Noted  . Severe recurrent major depression without psychotic features (HCC) [F33.2] 07/07/2015  . Cocaine-induced mood disorder (HCC) [F14.94] 07/06/2015  . Reflux esophagitis [K21.0]   . Diverticulosis of large intestine without hemorrhage [K57.30]   . Hematochezia [K92.1]   . Heme + stool [R19.5] 05/11/2015  . Rectal bleeding [K62.5] 05/11/2015  . Morbid obesity (HCC) [E66.01] 01/24/2015  . Type II diabetes mellitus, uncontrolled (HCC) [E11.65] 01/20/2015  . Essential hypertension, benign [I10] 01/20/2015  . Hyperlipemia [E78.5] 01/20/2015   Total Time spent with patient: 20 minutes  Past Psychiatric History: see admission H and P  Past Medical History:  Past Medical History  Diagnosis Date  . Gout   . Diverticula of colon   . Substance abuse     hx etoh, marijuana, cocaine. quit 02/2014  . Diabetes mellitus without complication (HCC)     boarderline    Past Surgical History  Procedure Laterality Date  . None to date      As of 05/11/15  . Colonoscopy with propofol N/A 06/04/2015    RMR: internal hemorrhoids/anal papilla  . Esophagogastroduodenoscopy (egd) with propofol N/A 06/04/2015    RMR: mild erosive reflux   Family History:  Family History  Problem Relation Age of Onset  . Colon cancer Neg Hx    Family Psychiatric  History: see admission H and P Social History:  History  Alcohol Use No    Comment: hx alcoholism. Now only a drink  on special occasions (2-3 times a year)     History  Drug Use  . Yes  . Special: Cocaine, Marijuana    Social History   Social History  . Marital Status: Single    Spouse Name: N/A  . Number of Children: N/A  . Years of Education: N/A   Social History Main Topics  . Smoking status: Never Smoker   . Smokeless tobacco: Never Used  . Alcohol Use: No     Comment: hx alcoholism. Now only a drink on special occasions (2-3 times a year)  . Drug Use: Yes    Special: Cocaine, Marijuana  . Sexual Activity: Not Asked   Other Topics Concern  . None   Social History Narrative   Additional Social History:    Pain Medications: denies currently Prescriptions: denies currently Over the Counter: denies currently History of alcohol / drug use?: Yes Longest period of sobriety (when/how long): 14 months Negative Consequences of Use: Financial, Personal relationships Withdrawal Symptoms: Other (Comment) (None reported) Name of Substance 1: cocaine 1 - Age of First Use: unk 1 - Amount (size/oz): 1/2 oz 1 - Duration: 1 time 1 - Last Use / Amount: past weekend                  Sleep: Poor  Appetite:  Fair  Current Medications: Current Facility-Administered Medications  Medication Dose Route Frequency Provider Last Rate Last Dose  . acetaminophen (TYLENOL) tablet 650 mg  650 mg Oral Q6H  PRN Thermon Leyland, NP   650 mg at 07/13/15 0827  . alum & mag hydroxide-simeth (MAALOX/MYLANTA) 200-200-20 MG/5ML suspension 30 mL  30 mL Oral Q4H PRN Thermon Leyland, NP      . escitalopram (LEXAPRO) tablet 5 mg  5 mg Oral Daily Rachael Fee, MD   5 mg at 07/14/15 6962  . gabapentin (NEURONTIN) capsule 300 mg  300 mg Oral TID Rachael Fee, MD   300 mg at 07/14/15 9528  . hydrOXYzine (ATARAX/VISTARIL) tablet 50 mg  50 mg Oral QHS PRN Thermon Leyland, NP   50 mg at 07/12/15 2100  . hydrOXYzine (ATARAX/VISTARIL) tablet 50 mg  50 mg Oral Q6H PRN Rachael Fee, MD   50 mg at 07/10/15 1043  .  lamoTRIgine (LAMICTAL) tablet 25 mg  25 mg Oral BID Rachael Fee, MD   25 mg at 07/14/15 4132  . lisinopril (PRINIVIL,ZESTRIL) tablet 20 mg  20 mg Oral Daily Rachael Fee, MD   20 mg at 07/14/15 4401  . LORazepam (ATIVAN) tablet 1 mg  1 mg Oral Q8H PRN Rachael Fee, MD   1 mg at 07/14/15 0272  . magnesium hydroxide (MILK OF MAGNESIA) suspension 30 mL  30 mL Oral Daily PRN Thermon Leyland, NP      . metFORMIN (GLUCOPHAGE-XR) 24 hr tablet 500 mg  500 mg Oral Q breakfast Rachael Fee, MD   500 mg at 07/14/15 5366  . metoprolol (LOPRESSOR) tablet 50 mg  50 mg Oral BID Rachael Fee, MD   50 mg at 07/14/15 4403  . pravastatin (PRAVACHOL) tablet 20 mg  20 mg Oral q1800 Rachael Fee, MD   20 mg at 07/13/15 2109  . prazosin (MINIPRESS) capsule 2 mg  2 mg Oral QHS Rachael Fee, MD   2 mg at 07/13/15 2108  . QUEtiapine (SEROQUEL) tablet 50 mg  50 mg Oral TID Rachael Fee, MD   50 mg at 07/14/15 4742  . traZODone (DESYREL) tablet 200 mg  200 mg Oral QHS Rachael Fee, MD   200 mg at 07/13/15 2108    Lab Results:  Results for orders placed or performed during the hospital encounter of 07/06/15 (from the past 48 hour(s))  PSA     Status: None   Collection Time: 07/14/15  6:10 AM  Result Value Ref Range   PSA 0.15 0.00 - 4.00 ng/mL    Comment: (NOTE) While PSA levels of <=4.0 ng/ml are reported as reference range, some men with levels below 4.0 ng/ml can have prostate cancer and many men with PSA above 4.0 ng/ml do not have prostate cancer.  Other tests such as free PSA, age specific reference ranges, PSA velocity and PSA doubling time may be helpful especially in men less than 34 years old. Performed at [facility]     Blood Alcohol level:  Lab Results  Component Value Date   [facility] <5 07/06/2015    Physical Findings: AIMS: Facial and Oral Movements Muscles of Facial Expression: None, normal Lips and Perioral Area: None, normal Jaw: None, normal Tongue: None, normal,Extremity  Movements Upper (arms, wrists, hands, fingers): None, normal Lower (legs, knees, ankles, toes): None, normal, Trunk Movements Neck, shoulders, hips: None, normal, Overall Severity Severity of abnormal movements (highest score from questions above): None, normal Incapacitation due to abnormal movements: None, normal Patient's awareness of abnormal movements (rate only patient's report): No Awareness, Dental Status Current problems with teeth  and/or dentures?: No Does patient usually wear dentures?: No  CIWA:  CIWA-Ar Total: 1 COWS:  COWS Total Score: 2  Musculoskeletal: Strength & Muscle Tone: within normal limits Gait & Station: normal Patient leans: normal  Psychiatric Specialty Exam: Review of Systems  Constitutional: Positive for malaise/fatigue.  HENT: Negative.   Eyes: Negative.   Respiratory: Negative.   Cardiovascular: Negative.   Gastrointestinal: Negative.   Genitourinary: Negative.   Musculoskeletal: Negative.   Skin: Negative.   Neurological: Positive for weakness.  Endo/Heme/Allergies: Negative.   Psychiatric/Behavioral: Positive for depression, suicidal ideas and substance abuse. The patient is nervous/anxious.     Blood pressure 97/57, pulse 91, temperature 98 F (36.7 C), temperature source Oral, resp. rate 20, height 5\' 6"  (1.676 m), weight 127.007 kg (280 lb), SpO2 98 %.Body mass index is 45.21 kg/(m^2).  General Appearance: Fairly Groomed  Patent attorneyye Contact::  Fair  Speech:  Clear and Coherent  Volume:  Decreased  Mood:  Anxious, Depressed, Dysphoric and Hopeless  Affect:  Restricted  Thought Process:  Coherent and Goal Directed  Orientation:  Full (Time, Place, and Person)  Thought Content:  symptoms events worries concerns  Suicidal Thoughts:  Yes.  without intent/plan  Homicidal Thoughts:  No  Memory:  Immediate;   Fair Recent;   Fair Remote;   Fair  Judgement:  Fair  Insight:  Present and Shallow  Psychomotor Activity:  Restlessness  Concentration:   Fair  Recall:  [employer]Fair  Fund of Knowledge:Fair  Language: Fair  Akathisia:  No  Handed:  Right  AIMS (if indicated):     Assets:  Desire for Improvement  ADL's:  Intact  Cognition: WNL  Sleep:  Number of Hours: 6.75   Treatment Plan Summary: Daily contact with patient to assess and evaluate symptoms and progress in treatment and Medication management Supportive approach/coping skills Cocaine dependence; continue to work a relapse prevention plan Depression; continue the Lexapro 10 Mood instability; continue to work with the Lamictal; 25 BID Anxiety/agitation; Neurontin 300 mg (off label) Nightmares; prazosin increase to 2 mg Ruminative thinking; Seroquel 100 mg HS Work with CBT/mindfulness If testosterone level still low on a second test consider replacing as PSA is neg Channell Quattrone A, MD 07/14/2015, 11:51 AM

## 2015-07-14 NOTE — Progress Notes (Signed)
Recreation Therapy Notes  Animal-Assisted Activity (AAA) Program Checklist/Progress Notes Patient Eligibility Criteria Checklist & Daily Group note for Rec Tx Intervention  Date: 04.12.2017 Time: 2:45pm Location: [address] Dayroom   AAA/T Program Assumption of Risk Form signed by Patient/ or Parent Legal Guardian Yes  Patient is free of allergies or sever asthma Yes  Patient reports no fear of animals Yes  Patient reports no history of cruelty to animals Yes  Patient understands his/her participation is voluntary Yes  Behavioral Response: Did not attend.   Wyley Hack L Antoinette Borgwardt, [name]        Analilia Geddis L 07/14/2015 3:02 PM 

## 2015-07-14 NOTE — Progress Notes (Signed)
D: Pt is isolative and withdrawn to room; was in bed for most of the evening. Pt who is flat and irritable endorses moderate depression and moderate anxiety. Pt also admitted to some suicidal thought however, was able to contract for safety; he states, "every now and then I still think about doing something to myself but I always remind myself that I came here to get better." Pt denied pain, HI or AVH. Pt remained calm and cooperative. A: Medications offered as prescribed.  Support, encouragement, and safe environment provided.  15-minute safety checks continue. R: Pt was med compliant.  Pt did not attend wrap-up group. Safety checks continue.

## 2015-07-14 NOTE — Tx Team (Signed)
Interdisciplinary Treatment Plan Update (Adult)  Date:  07/14/2015  Time Reviewed:  9:30 AM   Progress in Treatment: Attending groups: Intermittently  Participating in groups:  Yes, when he attends  Taking medication as prescribed:  Yes. Tolerating medication:  Yes. Family/Significant othe contact made:  SPE completed with pt; as he declined to consent to family contact.  Patient understands diagnosis:  Yes. and As evidenced by:  seeking treatment for Discussing patient identified problems/goals with staff:  Yes. Medical problems stabilized or resolved:  Yes. Denies suicidal/homicidal ideation: No, patient endorsing SI  Issues/concerns per patient self-inventory:  Other:  Discharge Plan or Barriers: CSW assessing for appropriate referrals. Pt requested [facility] and [employer]. After this was provided, pt stated that he did not want to pursue either and cannot identify plan at this time. "I'm not sure where I want to go or what I want to do." Pt given information about how to get set up for [facility] services per his request. Pt continues to state that he does not know what to do from here and is resistant to receiving information for placement/[facility] houses/etc. He is now isolating in his room and not attending groups.    4/11: Patient interested in residential treatment. [facility] referral pending. Patient provided listing of [facility] and information on various recovery programs.  Reason for Continuation of Hospitalization: Depression Hallucinations Medication stabilization  Comments:  Mark Acosta is an 45 y.o. male who presents reporting symptoms of depression and suicidal ideation. Pt has a history of bipolar and has recent stressors of his mom dying 2 years ago and his dad currently dying. He says he has been increasingly depressed and not sleeping for the past 2 months, and "I pray to god to take me, I am tired of myself". He states that this weekend he relapsed  after 14 months sobriety and used 1/2 gram of cocaine in an attempt for his "heart to blow up". He has also thought about slitting his wrist or hanging himself. In 2016, he was about to hang himself with a rope near a tree, when his mom dies, but his father talked him out of it and he went to [facility] help.Pt reports stopping medication a couple of months ago. He states he was on "something good" from [facility], and the MD at mental health took him off and put him on Trazadone for sleep, which did not work, so he stopped taking it. He hasn't gone back to Op treatment for 6 mo.Pt acknowledges symptoms including crying spells, social withdrawal, loss of interest in usual pleasures, decreased concentration, fatigue, irritability, decreased sleep, decreased appetite and feelings of hopelessness. PT denies homicidal ideation or history of violence. Pt admits to mild auditory and visual hallucinations "hearing bumps and seeing shadows". Pt denies alcohol or substance abuse--although UDS positive for cocaine. Pt lives with a roommate who drinks, and had lived for 10 months in a halfway house for [facility]) and was doing well at that time. His supports include his family, but he states, "They have their own lives". Pt denies history of abuse and trauma. 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 Pt has fair insight and judgement. Diagnosis: Bipolar Disorder  Estimated length of stay:  3-5 days   New goal(s): to develop effective aftercare plan   Additional Comments:  Patient and CSW reviewed pt's identified goals and treatment plan. Patient verbalized understanding and agreed to treatment plan. CSW reviewed [facility] "Discharge  Process and Patient Involvement" Form. Pt verbalized understanding of information provided and signed form.    Review of initial/current patient goals per problem list:  1. Goal(s): Patient will participate in aftercare plan  Met: No.   Target date: at  discharge  As evidenced by: Patient will participate within aftercare plan AEB aftercare provider and housing plan at discharge being identified.  4/4: CSW assessing for appropriate referrals.   4/7 Multiple resources provided to pt; he continues to struggle with committing to a discharge plan. CSW continuing to assess.  4/11: Multiple resources provided to pt; he continues to struggle with committing to a discharge plan. [name] referral pending.   2. Goal (s): Patient will exhibit decreased depressive symptoms and suicidal ideations.  Met: No   Target date: at discharge  As evidenced by: Patient will utilize self rating of depression at 3 or below and demonstrate decreased signs of depression or be deemed stable for discharge by MD.  4/4: Pt rates depression as high. Denies SI/HI/AVH currently.   4/7: Pt rates depression as high. Passive SI at times (able to contract for safety on the unit).   4/11: Patient rates depression as high and endorses passive SI at this time.    3. Goal(s): Patient will demonstrate decreased signs of withdrawal due to substance abuse  Met:yes  Target date:at discharge   As evidenced by: Patient will produce a CIWA/COWS score of 0, have stable vitals signs, and no symptoms of withdrawal.  4/4: Pt reports mild withdrawals with no COWS and high standing Pulse/Sitting BP.   4/7: Pt reports mild withdrawals with COWS of 1 and stable vitals. Goal progressing.  4/11: Goal met. No withdrawal symptoms reported at this time per medical chart.   Attendees: Patient:    Family:    Physician:  Dr. Sabra Heck 07/14/2015 9:30 AM  Nursing: Darrol Angel, Mayra Neer, RN 07/14/2015 9:30 AM  Clinical Social Worker: Tilden Fossa, LCSW 07/14/2015 9:30 AM  Other: Peri Maris, LCSWA  07/14/2015 9:30 AM  Other: Norberto Sorenson, P4CC 07/14/2015 9:30 AM  Other:  07/14/2015 9:30 AM  Other: Agustina Caroli, NP 07/14/2015 9:30 AM  Other:      Tilden Fossa,  LCSW Clinical Social Worker [facility] [phone]

## 2015-07-14 NOTE — Progress Notes (Signed)
D: Patient continues to express passive suicidal thoughts.  He is able to contract for safety on the unit.  Patient presents with somewhat irritable mood; sad and anxious affect.  He rates his depression as an 8; hopelessness and anxiety as a 10.  He states he is suicidal "almost all of the time."  His goal today is to "try to be normal and be honest with doctor."  His main complaint is lack of sleep.  He states, "with all the sleeping medications I took last night, I still tossed and turned all night."  He denies HI/AVH. A: Continue to monitor medication management and MD orders.  Safety checks completed every 15 minutes per protocol.  Offer support and encouragement as needed. R: Patient is receptive to staff; he remains isolative and withdrawn at times.

## 2015-07-14 NOTE — Progress Notes (Signed)
Mark Acosta is still endorsing depression 7/10 and Anxiety 6/10. Endorses passive SI and does not contract for safety although he states he will be honest and answer truthfully when asked whether he is endorsing SI/HI/AVH. He states he does not feel any different than when he was admitted. At this time he denies HI/AVH. Encouragement and support given. Medications administered as prescribed. Continue Q 15 minute checks for patient safety and medication effectiveness.

## 2015-07-15 LAB — TESTOSTERONE,FREE AND TOTAL
TESTOSTERONE: 189 ng/dL — AB (ref 348–1197)
Testosterone, Free: 9.8 pg/mL (ref 6.8–21.5)

## 2015-07-15 MED ORDER — QUETIAPINE FUMARATE 200 MG PO TABS
200.0000 mg | ORAL_TABLET | Freq: Every day | ORAL | Status: DC
  Administered 2015-07-15 – 2015-07-19 (×5): 200 mg via ORAL
  Filled 2015-07-15 (×6): qty 1

## 2015-07-15 MED ORDER — TRAZODONE HCL 100 MG PO TABS
100.0000 mg | ORAL_TABLET | Freq: Every evening | ORAL | Status: DC | PRN
Start: 2015-07-15 — End: 2015-07-20
  Administered 2015-07-15 – 2015-07-19 (×3): 100 mg via ORAL
  Filled 2015-07-15 (×2): qty 1

## 2015-07-15 NOTE — Progress Notes (Signed)
D.  Pt appears flat and anxious on approach, denies other complaints at this time.  Pt asked to know what new medications he was receiving at bedtime and why.  Pt did attend evening AA group, minimal interaction on the unit.  Continues to endorse passive suicidal ideation.  A.  Support and encouragement offered, medication given as ordered  R.  Pt remains safe on the unit, will continue to monitor.

## 2015-07-15 NOTE — [facility] Group Notes (Signed)
[facility] LCSW Aftercare Discharge Planning Group Note  07/15/2015  8:45 AM  Participation Quality: Did Not Attend. Patient invited to participate but declined.  Nesha Counihan, MSW, LCSW Clinical Social Worker [facility] [phone]   

## 2015-07-15 NOTE — [facility] Group Notes (Signed)
Patient attend group. 

## 2015-07-15 NOTE — [facility] Group Notes (Signed)
[facility] LCSW Group Therapy 07/15/2015  1:15 PM Type of Therapy: Group Therapy Participation Level: Active  Participation Quality: Attentive, Sharing and Supportive  Affect: Depressed and agitated  Cognitive: Alert and Oriented  Insight: Developing/Improving and Engaged  Engagement in Therapy: Developing/Improving and Engaged  Modes of Intervention: Clarification, Confrontation, Discussion, Education, Exploration, Limit-setting, Orientation, Problem-solving, Rapport Building, Dance movement psychotherapisteality Testing, Socialization and Support  Summary of Progress/Problems: The topic for group today was emotional regulation. This group focused on both positive and negative emotion identification and allowed group members to process ways to identify feelings, regulate negative emotions, and find healthy ways to manage internal/external emotions. Group members were asked to reflect on a time when their reaction to an emotion led to a negative outcome and explored how alternative responses using emotion regulation would have benefited them. Group members were also asked to discuss a time when emotion regulation was utilized when a negative emotion was experienced. Patient expressed difficulty regulating his anger and negative feelings about himself which stem from feeling rejected by his family and his addiction issues. CSW and other group members provided patient with emotional support and encouragement.   Samuella BruinKristin Shawndrea Rutkowski, MSW, LCSW Clinical Social Worker [facility] [phone]

## 2015-07-15 NOTE — Progress Notes (Signed)
[facility] due to dual diagnosis.  Samuella BruinKristin Danija Gosa, LCSW Clinical Social Worker [facility] [phone]

## 2015-07-15 NOTE — Progress Notes (Signed)
[facility] MD Progress Note  07/15/2015 11:45 AM Mark Acosta  MRN:  [id number] Subjective:  Pt states " I am not able to sleep too well , I feel dizzy when my medications are increased.'  Objective:Patient seen and chart reviewed.Discussed patient with treatment team.  Mark Acosta continues to report anxiety, depression . Pt also reports SI , denies plan. Pt reports that he has sleep issues and would like his medications to be readjusted . Pt continues to need encouragement and support to attend groups and participate in milieu. Pt continues to be withdrawn and isolative.      Principal Problem: Severe recurrent major depression without psychotic features (HCC) Diagnosis:   Patient Active Problem List   Diagnosis Date Noted  . Severe recurrent major depression without psychotic features (HCC) [F33.2] 07/07/2015  . Cocaine-induced mood disorder (HCC) [F14.94] 07/06/2015  . Reflux esophagitis [K21.0]   . Diverticulosis of large intestine without hemorrhage [K57.30]   . Hematochezia [K92.1]   . Heme + stool [R19.5] 05/11/2015  . Rectal bleeding [K62.5] 05/11/2015  . Morbid obesity (HCC) [E66.01] 01/24/2015  . Type II diabetes mellitus, uncontrolled (HCC) [E11.65] 01/20/2015  . Essential hypertension, benign [I10] 01/20/2015  . Hyperlipemia [E78.5] 01/20/2015   Total Time spent with patient: 25 minutes  Past Psychiatric History: see admission H and P  Past Medical History:  Past Medical History  Diagnosis Date  . Gout   . Diverticula of colon   . Substance abuse     hx etoh, marijuana, cocaine. quit 02/2014  . Diabetes mellitus without complication (HCC)     boarderline    Past Surgical History  Procedure Laterality Date  . None to date      As of 05/11/15  . Colonoscopy with propofol N/A 06/04/2015    RMR: internal hemorrhoids/anal papilla  . Esophagogastroduodenoscopy (egd) with propofol N/A 06/04/2015    RMR: mild erosive reflux   Family History:  Family History  Problem  Relation Age of Onset  . Colon cancer Neg Hx    Family Psychiatric  History: see admission H and P Social History:  History  Alcohol Use No    Comment: hx alcoholism. Now only a drink on special occasions (2-3 times a year)     History  Drug Use  . Yes  . Special: Cocaine, Marijuana    Social History   Social History  . Marital Status: Single    Spouse Name: N/A  . Number of Children: N/A  . Years of Education: N/A   Social History Main Topics  . Smoking status: Never Smoker   . Smokeless tobacco: Never Used  . Alcohol Use: No     Comment: hx alcoholism. Now only a drink on special occasions (2-3 times a year)  . Drug Use: Yes    Special: Cocaine, Marijuana  . Sexual Activity: Not Asked   Other Topics Concern  . None   Social History Narrative   Additional Social History:    Pain Medications: denies currently Prescriptions: denies currently Over the Counter: denies currently History of alcohol / drug use?: Yes Longest period of sobriety (when/how long): 14 months Negative Consequences of Use: Financial, Personal relationships Withdrawal Symptoms: Other (Comment) (None reported) Name of Substance 1: cocaine 1 - Age of First Use: unk 1 - Amount (size/oz): 1/2 oz 1 - Duration: 1 time 1 - Last Use / Amount: past weekend                  Sleep: Poor  Appetite:  Fair  Current Medications: Current Facility-Administered Medications  Medication Dose Route Frequency Provider Last Rate Last Dose  . acetaminophen (TYLENOL) tablet 650 mg  650 mg Oral Q6H PRN Thermon Leyland, NP   650 mg at 07/14/15 1720  . alum & mag hydroxide-simeth (MAALOX/MYLANTA) 200-200-20 MG/5ML suspension 30 mL  30 mL Oral Q4H PRN Thermon Leyland, NP      . escitalopram (LEXAPRO) tablet 10 mg  10 mg Oral Daily Rachael Fee, MD   10 mg at 07/15/15 7829  . gabapentin (NEURONTIN) capsule 300 mg  300 mg Oral TID Rachael Fee, MD   300 mg at 07/15/15 0839  . hydrOXYzine (ATARAX/VISTARIL)  tablet 50 mg  50 mg Oral QHS PRN Thermon Leyland, NP   50 mg at 07/12/15 2100  . hydrOXYzine (ATARAX/VISTARIL) tablet 50 mg  50 mg Oral Q6H PRN Rachael Fee, MD   50 mg at 07/10/15 1043  . lamoTRIgine (LAMICTAL) tablet 25 mg  25 mg Oral BID Rachael Fee, MD   25 mg at 07/15/15 0839  . lisinopril (PRINIVIL,ZESTRIL) tablet 20 mg  20 mg Oral Daily Rachael Fee, MD   20 mg at 07/15/15 0839  . LORazepam (ATIVAN) tablet 1 mg  1 mg Oral Q8H PRN Rachael Fee, MD   1 mg at 07/15/15 0840  . magnesium hydroxide (MILK OF MAGNESIA) suspension 30 mL  30 mL Oral Daily PRN Thermon Leyland, NP      . metFORMIN (GLUCOPHAGE-XR) 24 hr tablet 500 mg  500 mg Oral Q breakfast Rachael Fee, MD   500 mg at 07/15/15 0839  . metoprolol (LOPRESSOR) tablet 50 mg  50 mg Oral BID Rachael Fee, MD   50 mg at 07/15/15 5621  . pravastatin (PRAVACHOL) tablet 20 mg  20 mg Oral q1800 Rachael Fee, MD   20 mg at 07/14/15 2201  . prazosin (MINIPRESS) capsule 2 mg  2 mg Oral QHS Rachael Fee, MD   2 mg at 07/14/15 2159  . QUEtiapine (SEROQUEL) tablet 200 mg  200 mg Oral QHS Jomarie Longs, MD      . traZODone (DESYREL) tablet 100 mg  100 mg Oral QHS PRN Jomarie Longs, MD        Lab Results:  Results for orders placed or performed during the hospital encounter of 07/06/15 (from the past 48 hour(s))  PSA     Status: None   Collection Time: 07/14/15  6:10 AM  Result Value Ref Range   PSA 0.15 0.00 - 4.00 ng/mL    Comment: (NOTE) While PSA levels of <=4.0 ng/ml are reported as reference range, some men with levels below 4.0 ng/ml can have prostate cancer and many men with PSA above 4.0 ng/ml do not have prostate cancer.  Other tests such as free PSA, age specific reference ranges, PSA velocity and PSA doubling time may be helpful especially in men less than 28 years old. Performed at [facility]     Blood Alcohol level:  Lab Results  Component Value Date   [facility] <5 07/06/2015    Physical Findings: AIMS: Facial  and Oral Movements Muscles of Facial Expression: None, normal Lips and Perioral Area: None, normal Jaw: None, normal Tongue: None, normal,Extremity Movements Upper (arms, wrists, hands, fingers): None, normal Lower (legs, knees, ankles, toes): None, normal, Trunk Movements Neck, shoulders, hips: None, normal, Overall Severity Severity of abnormal movements (highest score from questions above): None, normal  Incapacitation due to abnormal movements: None, normal Patient's awareness of abnormal movements (rate only patient's report): No Awareness, Dental Status Current problems with teeth and/or dentures?: No Does patient usually wear dentures?: No  CIWA:  CIWA-Ar Total: 1 COWS:  COWS Total Score: 2  Musculoskeletal: Strength & Muscle Tone: within normal limits Gait & Station: normal Patient leans: normal  Psychiatric Specialty Exam: Review of Systems  Constitutional: Positive for malaise/fatigue.  HENT: Negative.   Eyes: Negative.   Respiratory: Negative.   Cardiovascular: Negative.   Gastrointestinal: Negative.   Genitourinary: Negative.   Musculoskeletal: Negative.   Skin: Negative.   Neurological: Positive for weakness.  Endo/Heme/Allergies: Negative.   Psychiatric/Behavioral: Positive for depression, suicidal ideas, hallucinations (myn own voice) and substance abuse. The patient is nervous/anxious and has insomnia.   All other systems reviewed and are negative.   Blood pressure 113/66, pulse 86, temperature 97.8 F (36.6 C), temperature source Oral, resp. rate 18, height 5\' 6"  (1.676 m), weight 127.007 kg (280 lb), SpO2 98 %.Body mass index is 45.21 kg/(m^2).  General Appearance: Fairly Groomed  Patent attorneyye Contact::  Fair  Speech:  Clear and Coherent  Volume:  Decreased  Mood:  Anxious, Depressed, Dysphoric and Hopeless  Affect:  Restricted  Thought Process:  Coherent and Goal Directed  Orientation:  Full (Time, Place, and Person)  Thought Content:  symptoms events worries  concerns  Suicidal Thoughts:  Yes.  without intent/plan  Homicidal Thoughts:  No  Memory:  Immediate;   Fair Recent;   Fair Remote;   Fair  Judgement:  Fair  Insight:  Present and Shallow  Psychomotor Activity:  Restlessness  Concentration:  Fair  Recall:  FiservFair  Fund of Knowledge:Fair  Language: Fair  Akathisia:  No  Handed:  Right  AIMS (if indicated):     Assets:  Desire for Improvement  ADL's:  Intact  Cognition: WNL  Sleep:  Number of Hours: 6.25   Treatment Plan Summary:Pt continues to be withdrawn has sleep issues. Will continue treatment.  Daily contact with patient to assess and evaluate symptoms and progress in treatment and Medication management Supportive approach/coping skills Cocaine dependence; continue to work a relapse prevention plan Depression; continue the Lexapro 10 Mood instability; continue to work with the Lamictal; 25 BID Anxiety/agitation; Neurontin 300 mg (off label) po tid Nightmares; prazosin  2 mg po qhs  Ruminative thinking; Increase the Seroquel to 200 mg po HS. Will reduce Trazodone to 100 mg po and make it PRN QHS for dizziness - likely due to polypharmacy. Work with CBT/mindfulness If testosterone level still low on a second test consider replacing as PSA is neg- Testosterone level pending.   Karrina Lye, MD 07/15/2015, 11:45 AM

## 2015-07-15 NOTE — [facility] Group Notes (Signed)
Patient did not attend group until 15 minutes befor over.

## 2015-07-15 NOTE — Progress Notes (Signed)
Recreation Therapy Notes  Date: 04.12.2017 Time: 9:30am Location: [address] Group Room   Group Topic: Stress Management  Goal Area(s) Addresses:  Patient will actively participate in stress management techniques presented during session.   Behavioral Response: Did not attend.    Marykay Lexenise L Adrionna Delcid, [name]        Jearl KlinefelterBlanchfield, Tari Lecount L 07/15/2015 6:57 PM

## 2015-07-16 MED ORDER — TESTOSTERONE CYPIONATE 200 MG/ML IM SOLN
200.0000 mg | INTRAMUSCULAR | Status: DC
  Administered 2015-07-16: 200 mg via INTRAMUSCULAR

## 2015-07-16 NOTE — Progress Notes (Signed)
CSW [facility] [phone] to discuss discharge plans at patient's request. Awaiting return call.   Toma Copierastor Jack Martin from [facility] plans to come meet with patient on Friday 4/14 at 10:30am to discuss admission to that program.  Samuella BruinKristin Loan Oguin, LCSW Clinical Social Worker [facility] [phone]

## 2015-07-16 NOTE — [facility] Group Notes (Signed)
[facility] LCSW Group Therapy 07/16/2015  1:15 pm   Type of Therapy: Group Therapy Participation Level: Active  Participation Quality: Attentive, Sharing and Supportive  Affect: Depressed and agitated  Cognitive: Alert and Oriented  Insight: Developing/Improving and Engaged  Engagement in Therapy: Developing/Improving and Engaged  Modes of Intervention: Clarification, Confrontation, Discussion, Education, Exploration, Limit-setting, Orientation, Problem-solving, Rapport Building, Dance movement psychotherapisteality Testing, Socialization and Support  Summary of Progress/Problems: The topic for group was balance in life. Today's group focused on defining balance in one's own words, identifying things that can knock one off balance, and exploring healthy ways to maintain balance in life. Group members were asked to provide an example of a time when they felt off balance, describe how they handled that situation,and process healthier ways to regain balance in the future. Group members were asked to share the most important tool for maintaining balance that they learned [facility] and how they plan to apply this method after discharge. Patient discussed having difficulty expressing his emotions (other than anger) and asked the group what was the point of crying or dealing with the past? Patient expressed feeling angry often and discussed some traumatic memories. He stated that he was raised with the mentality that expressing one's feelings meant that you were weak. CSW and other group members provided patient with emotional support and encouragement.   Mark Acosta, MSW, LCSW Clinical Social Worker [facility] [phone]

## 2015-07-16 NOTE — Progress Notes (Signed)
D: Patient continues to be flat and anxious.  He rates his depression and hopelessness as a 7; anxiety as an 8.  His goal today is to "talk to doctor and Child psychotherapistsocial worker.  Patient has requested ativan X1 for anxiety today.  He states, "I just have periods of high anxiety."  He continues to express intermittent suicidal thoughts.  He is able to contract for safety.  Patient states, "I'm just feeling tired today."  He denies HI/AVH. A: Continue to monitor medication management and MD orders.  Safety checks completed every 15 minutes per protocol.  Offer support and encouragement as needed. R: Patient is receptive to staff; his behavior is appropriate.

## 2015-07-16 NOTE — Progress Notes (Signed)
Patient ID: Mark Acosta, male   DOB: 1970/11/03, 44 y.o.   MRN: [id number] [facility] MD Progress Note  07/16/2015 5:56 PM Mark Acosta  MRN:  [id number] Subjective:  Mark Acosta continues to have a hard time. He continues to endorse severe depression with suicidal ruminations. States he just wants to have some hope that things can get better for him. States he is having some vivid dreams. He is still anergic hopes that if the testosterone is low again that we could start replacing Principal Problem: Severe recurrent major depression without psychotic features [facility]) Diagnosis:   Patient Active Problem List   Diagnosis Date Noted  . Severe recurrent major depression without psychotic features (HCC) [F33.2] 07/07/2015  . Cocaine-induced mood disorder (HCC) [F14.94] 07/06/2015  . Reflux esophagitis [K21.0]   . Diverticulosis of large intestine without hemorrhage [K57.30]   . Hematochezia [K92.1]   . Heme + stool [R19.5] 05/11/2015  . Rectal bleeding [K62.5] 05/11/2015  . Morbid obesity (HCC) [E66.01] 01/24/2015  . Type II diabetes mellitus, uncontrolled (HCC) [E11.65] 01/20/2015  . Essential hypertension, benign [I10] 01/20/2015  . Hyperlipemia [E78.5] 01/20/2015   Total Time spent with patient: 20 minutes  Past Psychiatric History: see admission H and P  Past Medical History:  Past Medical History  Diagnosis Date  . Gout   . Diverticula of colon   . Substance abuse     hx etoh, marijuana, cocaine. quit 02/2014  . Diabetes mellitus without complication (HCC)     boarderline    Past Surgical History  Procedure Laterality Date  . None to date      As of 05/11/15  . Colonoscopy with propofol N/A 06/04/2015    RMR: internal hemorrhoids/anal papilla  . Esophagogastroduodenoscopy (egd) with propofol N/A 06/04/2015    RMR: mild erosive reflux   Family History:  Family History  Problem Relation Age of Onset  . Colon cancer Neg Hx    Family Psychiatric  History: see admission H and P Social  History:  History  Alcohol Use No    Comment: hx alcoholism. Now only a drink on special occasions (2-3 times a year)     History  Drug Use  . Yes  . Special: Cocaine, Marijuana    Social History   Social History  . Marital Status: Single    Spouse Name: N/A  . Number of Children: N/A  . Years of Education: N/A   Social History Main Topics  . Smoking status: Never Smoker   . Smokeless tobacco: Never Used  . Alcohol Use: No     Comment: hx alcoholism. Now only a drink on special occasions (2-3 times a year)  . Drug Use: Yes    Special: Cocaine, Marijuana  . Sexual Activity: Not Asked   Other Topics Concern  . None   Social History Narrative   Additional Social History:    Pain Medications: denies currently Prescriptions: denies currently Over the Counter: denies currently History of alcohol / drug use?: Yes Longest period of sobriety (when/how long): 14 months Negative Consequences of Use: Financial, Personal relationships Withdrawal Symptoms: Other (Comment) (None reported) Name of Substance 1: cocaine 1 - Age of First Use: unk 1 - Amount (size/oz): 1/2 oz 1 - Duration: 1 time 1 - Last Use / Amount: past weekend                  Sleep: Poor  Appetite:  Fair  Current Medications: Current Facility-Administered Medications  Medication Dose Route Frequency Provider Last  Rate Last Dose  . acetaminophen (TYLENOL) tablet 650 mg  650 mg Oral Q6H PRN Thermon Leyland, NP   650 mg at 07/16/15 1555  . alum & mag hydroxide-simeth (MAALOX/MYLANTA) 200-200-20 MG/5ML suspension 30 mL  30 mL Oral Q4H PRN Thermon Leyland, NP      . escitalopram (LEXAPRO) tablet 10 mg  10 mg Oral Daily Rachael Fee, MD   10 mg at 07/16/15 0849  . gabapentin (NEURONTIN) capsule 300 mg  300 mg Oral TID Rachael Fee, MD   300 mg at 07/16/15 1704  . hydrOXYzine (ATARAX/VISTARIL) tablet 50 mg  50 mg Oral QHS PRN Thermon Leyland, NP   50 mg at 07/15/15 2127  . hydrOXYzine (ATARAX/VISTARIL)  tablet 50 mg  50 mg Oral Q6H PRN Rachael Fee, MD   50 mg at 07/10/15 1043  . lamoTRIgine (LAMICTAL) tablet 25 mg  25 mg Oral BID Rachael Fee, MD   25 mg at 07/16/15 1703  . lisinopril (PRINIVIL,ZESTRIL) tablet 20 mg  20 mg Oral Daily Rachael Fee, MD   20 mg at 07/16/15 0849  . LORazepam (ATIVAN) tablet 1 mg  1 mg Oral Q8H PRN Rachael Fee, MD   1 mg at 07/16/15 1304  . magnesium hydroxide (MILK OF MAGNESIA) suspension 30 mL  30 mL Oral Daily PRN Thermon Leyland, NP      . metFORMIN (GLUCOPHAGE-XR) 24 hr tablet 500 mg  500 mg Oral Q breakfast Rachael Fee, MD   500 mg at 07/16/15 0849  . metoprolol (LOPRESSOR) tablet 50 mg  50 mg Oral BID Rachael Fee, MD   50 mg at 07/16/15 0849  . pravastatin (PRAVACHOL) tablet 20 mg  20 mg Oral q1800 Rachael Fee, MD   20 mg at 07/15/15 2127  . prazosin (MINIPRESS) capsule 2 mg  2 mg Oral QHS Rachael Fee, MD   2 mg at 07/15/15 2127  . QUEtiapine (SEROQUEL) tablet 200 mg  200 mg Oral QHS Jomarie Longs, MD   200 mg at 07/15/15 2127  . testosterone cypionate (DEPOTESTOSTERONE CYPIONATE) injection 200 mg  200 mg Intramuscular Q14 Days Rachael Fee, MD   200 mg at 07/16/15 1546  . traZODone (DESYREL) tablet 100 mg  100 mg Oral QHS PRN Jomarie Longs, MD   100 mg at 07/15/15 2127    Lab Results:  No results found for this or any previous visit (from the past 48 hour(s)).  Blood Alcohol level:  Lab Results  Component Value Date   ETH <5 07/06/2015    Physical Findings: AIMS: Facial and Oral Movements Muscles of Facial Expression: None, normal Lips and Perioral Area: None, normal Jaw: None, normal Tongue: None, normal,Extremity Movements Upper (arms, wrists, hands, fingers): None, normal Lower (legs, knees, ankles, toes): None, normal, Trunk Movements Neck, shoulders, hips: None, normal, Overall Severity Severity of abnormal movements (highest score from questions above): None, normal Incapacitation due to abnormal movements: None,  normal Patient's awareness of abnormal movements (rate only patient's report): No Awareness, Dental Status Current problems with teeth and/or dentures?: No Does patient usually wear dentures?: No  CIWA:  CIWA-Ar Total: 1 COWS:  COWS Total Score: 2  Musculoskeletal: Strength & Muscle Tone: within normal limits Gait & Station: normal Patient leans: normal  Psychiatric Specialty Exam: Review of Systems  Constitutional: Positive for malaise/fatigue.  HENT: Negative.   Eyes: Negative.   Respiratory: Negative.   Cardiovascular: Negative.   Gastrointestinal: Negative.  Genitourinary: Negative.   Musculoskeletal: Negative.   Skin: Negative.   Neurological: Positive for weakness.  Endo/Heme/Allergies: Negative.   Psychiatric/Behavioral: Positive for depression, suicidal ideas and substance abuse. The patient is nervous/anxious.     Blood pressure 117/64, pulse 91, temperature 98.1 F (36.7 C), temperature source Oral, resp. rate 20, height 5\' 6"  (1.676 m), weight 127.007 kg (280 lb), SpO2 98 %.Body mass index is 45.21 kg/(m^2).  General Appearance: Fairly Groomed  Patent attorneyye Contact::  Fair  Speech:  Clear and Coherent  Volume:  Decreased  Mood:  Anxious, Depressed, Dysphoric and Hopeless  Affect:  Restricted  Thought Process:  Coherent and Goal Directed  Orientation:  Full (Time, Place, and Person)  Thought Content:  symptoms events worries concerns  Suicidal Thoughts:  Yes.  without intent/plan  Homicidal Thoughts:  No  Memory:  Immediate;   Fair Recent;   Fair Remote;   Fair  Judgement:  Fair  Insight:  Present and Shallow  Psychomotor Activity:  Restlessness  Concentration:  Fair  Recall:  [employer]Fair  Fund of Knowledge:Fair  Language: Fair  Akathisia:  No  Handed:  Right  AIMS (if indicated):     Assets:  Desire for Improvement  ADL's:  Intact  Cognition: WNL  Sleep:  Number of Hours: 6.5   Treatment Plan Summary: Daily contact with patient to assess and evaluate symptoms  and progress in treatment and Medication management Supportive approach/coping skills Cocaine dependence; continue to work a relapse prevention plan Depression; continue the Lexapro 10 Mood instability; continue to work with the Lamictal; 25 BID Anxiety/agitation; Neurontin 300 mg (off label) Nightmares; prazosin increase to 2 mg Ruminative thinking; Seroquel 100 mg HS Testosterone total was low again, it was lower than the previous one. Will start replacing as it could be a factor affecting his mood Work with CBT/mindfulness  Rachael FeeLUGO,Marielena Harvell A, MD 07/16/2015, 5:56 PM

## 2015-07-16 NOTE — Progress Notes (Signed)
D:Patient observed in milieu interacting with peers. Patient states goal for the day was to " work on discharge planning."  He states he did achieve this somewhat because he was able to talk with his doctor and Child psychotherapistsocial worker.  A: Support and encouragement offered. Q 15 minute checks in progress and maintained for safety. R: Patient remains safe on unit.

## 2015-07-17 LAB — FOLLICLE STIMULATING HORMONE: FSH: 9.2 m[IU]/mL (ref 1.5–12.4)

## 2015-07-17 LAB — PROLACTIN: PROLACTIN: 17 ng/mL — AB (ref 4.0–15.2)

## 2015-07-17 LAB — LUTEINIZING HORMONE: LH: 4.8 m[IU]/mL (ref 1.7–8.6)

## 2015-07-17 NOTE — Progress Notes (Signed)
Patient ID: Mark Acosta, Mark Acosta   DOB: [birth year], 45 y.o.   MRN: [id number]  D: Patient has a flat affect on approach this am. Reports depression and feelings of hopelessness " 6" and anxiety "8". Reports that he appetite and sleep have been good. Currently denies any SI at this time. Reports some medication changes. Ativan given prn earlier for agitation. A: Staff will monitor on q 15 minute checks, follow treatment plan, and give medications as ordered. R: Cooperative on the unit.

## 2015-07-17 NOTE — Progress Notes (Signed)
Patient attended AA group and participated. 

## 2015-07-17 NOTE — [facility] Group Notes (Signed)
[facility]  Date/Time: 07/17/2015   1:30PM  Type of Therapy and Topic:  Group Therapy:  Who Am I?  Self Esteem, Self-Actualization and Understanding Self.  Participation Level:  Active  Description of Group:    In this group patients will be asked to explore values, beliefs, truths, and morals as they relate to personal self.  Patients will be guided to discuss their thoughts, feelings, and behaviors related to what they identify as important to their true self. Patients will process together how values, beliefs and truths are connected to specific choices patients make every day. Each patient will be challenged to identify changes that they are motivated to make in order to improve self-esteem and self-actualization. This group will be process-oriented, with patients participating in exploration of their own experiences as well as giving and receiving support and challenge from other group members.  Therapeutic Goals: 1. Patient will identify false beliefs that currently interfere with their self-esteem.  2. Patient will identify feelings, thought process, and behaviors related to self and will become aware of the uniqueness of themselves and of others.  3. Patient will be able to identify and verbalize values, morals, and beliefs as they relate to self. 4. Patient will begin to learn how to build self-esteem/self-awareness by expressing what is important and unique to them personally.  Summary of Patient Progress   Patient entered at the end of group discussion, stated that he could see the resiliency in himself and expressed feeling more hopeful for the future, stating "everything is in God's hands."   Therapeutic Modalities:   Cognitive Behavioral Therapy Solution Focused Therapy Motivational Interviewing Brief Therapy   Samuella BruinKristin Shalondra Wunschel, LCSW Clinical Social Worker [facility] [phone]

## 2015-07-17 NOTE — [facility] Group Notes (Signed)
[facility] LCSW Aftercare Discharge Planning Group Note  07/17/2015  8:45 AM  Participation Quality: Did Not Attend. Patient invited to participate but declined.  Kallon Caylor, MSW, LCSW Clinical Social Worker [facility] [phone]    

## 2015-07-17 NOTE — Progress Notes (Signed)
D:Patient observed in milieu with peers. Patient states goal was to "work on discharge planning with doctor and social worker." A:Support and encouragement offered. Q 15 minute checks in progress and maintained for safety. R: Patient remains safe on unit.

## 2015-07-17 NOTE — Progress Notes (Signed)
[facility] MD Progress Note  07/17/2015 5:38 PM Mark Acosta  MRN:  [id number] Subjective:  Muhsin continues to have a hard time. He continues to endorse severe depression with suicidal ruminations. He is still anergic hopes that  the testosterone replacement can help him with his energy. He states needs to have a new sense of purpose as feels he does not have one.  Principal Problem: Severe recurrent major depression without psychotic features (HCC) Diagnosis:   Patient Active Problem List   Diagnosis Date Noted  . Severe recurrent major depression without psychotic features (HCC) [F33.2] 07/07/2015  . Cocaine-induced mood disorder (HCC) [F14.94] 07/06/2015  . Reflux esophagitis [K21.0]   . Diverticulosis of large intestine without hemorrhage [K57.30]   . Hematochezia [K92.1]   . Heme + stool [R19.5] 05/11/2015  . Rectal bleeding [K62.5] 05/11/2015  . Morbid obesity (HCC) [E66.01] 01/24/2015  . Type II diabetes mellitus, uncontrolled (HCC) [E11.65] 01/20/2015  . Essential hypertension, benign [I10] 01/20/2015  . Hyperlipemia [E78.5] 01/20/2015   Total Time spent with patient: 20 minutes  Past Psychiatric History: see admission H and P  Past Medical History:  Past Medical History  Diagnosis Date  . Gout   . Diverticula of colon   . Substance abuse     hx etoh, marijuana, cocaine. quit 02/2014  . Diabetes mellitus without complication (HCC)     boarderline    Past Surgical History  Procedure Laterality Date  . None to date      As of 05/11/15  . Colonoscopy with propofol N/A 06/04/2015    RMR: internal hemorrhoids/anal papilla  . Esophagogastroduodenoscopy (egd) with propofol N/A 06/04/2015    RMR: mild erosive reflux   Family History:  Family History  Problem Relation Age of Onset  . Colon cancer Neg Hx    Family Psychiatric  History: see admission H and P Social History:  History  Alcohol Use No    Comment: hx alcoholism. Now only a drink on special occasions (2-3 times a  year)     History  Drug Use  . Yes  . Special: Cocaine, Marijuana    Social History   Social History  . Marital Status: Single    Spouse Name: N/A  . Number of Children: N/A  . Years of Education: N/A   Social History Main Topics  . Smoking status: Never Smoker   . Smokeless tobacco: Never Used  . Alcohol Use: No     Comment: hx alcoholism. Now only a drink on special occasions (2-3 times a year)  . Drug Use: Yes    Special: Cocaine, Marijuana  . Sexual Activity: Not Asked   Other Topics Concern  . None   Social History Narrative   Additional Social History:    Pain Medications: denies currently Prescriptions: denies currently Over the Counter: denies currently History of alcohol / drug use?: Yes Longest period of sobriety (when/how long): 14 months Negative Consequences of Use: Financial, Personal relationships Withdrawal Symptoms: Other (Comment) (None reported) Name of Substance 1: cocaine 1 - Age of First Use: unk 1 - Amount (size/oz): 1/2 oz 1 - Duration: 1 time 1 - Last Use / Amount: past weekend                  Sleep: Poor  Appetite:  Fair  Current Medications: Current Facility-Administered Medications  Medication Dose Route Frequency Provider Last Rate Last Dose  . acetaminophen (TYLENOL) tablet 650 mg  650 mg Oral Q6H PRN Thermon Leyland, NP  650 mg at 07/17/15 1705  . alum & mag hydroxide-simeth (MAALOX/MYLANTA) 200-200-20 MG/5ML suspension 30 mL  30 mL Oral Q4H PRN Thermon Leyland, NP      . escitalopram (LEXAPRO) tablet 10 mg  10 mg Oral Daily Rachael Fee, MD   10 mg at 07/17/15 0831  . gabapentin (NEURONTIN) capsule 300 mg  300 mg Oral TID Rachael Fee, MD   300 mg at 07/17/15 1705  . hydrOXYzine (ATARAX/VISTARIL) tablet 50 mg  50 mg Oral QHS PRN Thermon Leyland, NP   50 mg at 07/15/15 2127  . hydrOXYzine (ATARAX/VISTARIL) tablet 50 mg  50 mg Oral Q6H PRN Rachael Fee, MD   50 mg at 07/10/15 1043  . lamoTRIgine (LAMICTAL) tablet 25 mg  25  mg Oral BID Rachael Fee, MD   25 mg at 07/17/15 1705  . lisinopril (PRINIVIL,ZESTRIL) tablet 20 mg  20 mg Oral Daily Rachael Fee, MD   20 mg at 07/17/15 0831  . LORazepam (ATIVAN) tablet 1 mg  1 mg Oral Q8H PRN Rachael Fee, MD   1 mg at 07/17/15 1210  . magnesium hydroxide (MILK OF MAGNESIA) suspension 30 mL  30 mL Oral Daily PRN Thermon Leyland, NP      . metFORMIN (GLUCOPHAGE-XR) 24 hr tablet 500 mg  500 mg Oral Q breakfast Rachael Fee, MD   500 mg at 07/17/15 0831  . metoprolol (LOPRESSOR) tablet 50 mg  50 mg Oral BID Rachael Fee, MD   50 mg at 07/17/15 0831  . pravastatin (PRAVACHOL) tablet 20 mg  20 mg Oral q1800 Rachael Fee, MD   20 mg at 07/16/15 2114  . prazosin (MINIPRESS) capsule 2 mg  2 mg Oral QHS Rachael Fee, MD   2 mg at 07/16/15 2113  . QUEtiapine (SEROQUEL) tablet 200 mg  200 mg Oral QHS Jomarie Longs, MD   200 mg at 07/16/15 2114  . testosterone cypionate (DEPOTESTOSTERONE CYPIONATE) injection 200 mg  200 mg Intramuscular Q14 Days Rachael Fee, MD   200 mg at 07/16/15 1546  . traZODone (DESYREL) tablet 100 mg  100 mg Oral QHS PRN Jomarie Longs, MD   100 mg at 07/15/15 2127    Lab Results:  Results for orders placed or performed during the hospital encounter of 07/06/15 (from the past 48 hour(s))  Prolactin     Status: Abnormal   Collection Time: 07/16/15  6:15 PM  Result Value Ref Range   Prolactin 17.0 (H) 4.0 - 15.2 ng/mL    Comment: (NOTE) Performed At: [facility] [address] [place], [place] [id number] Mila Homer MD [id number] Performed at [facility]   Luteinizing hormone     Status: None   Collection Time: 07/16/15  6:15 PM  Result Value Ref Range   LH 4.8 1.7 - 8.6 mIU/mL    Comment: (NOTE) Performed At: [facility] [address] [place], [place] [id number] Mila Homer MD [id number] Performed at [facility]   Follicle stimulating hormone     Status: None    Collection Time: 07/16/15  6:15 PM  Result Value Ref Range   FSH 9.2 1.5 - 12.4 mIU/mL    Comment: (NOTE) Performed At: [facility] [address] [place], [place] [id number] Mila Homer MD [id number] Performed at [facility]     Blood Alcohol level:  Lab Results  Component Value Date  ETH <5 07/06/2015    Physical Findings: AIMS: Facial and Oral Movements Muscles of Facial Expression: None, normal Lips and Perioral Area: None, normal Jaw: None, normal Tongue: None, normal,Extremity Movements Upper (arms, wrists, hands, fingers): None, normal Lower (legs, knees, ankles, toes): None, normal, Trunk Movements Neck, shoulders, hips: None, normal, Overall Severity Severity of abnormal movements (highest score from questions above): None, normal Incapacitation due to abnormal movements: None, normal Patient's awareness of abnormal movements (rate only patient's report): No Awareness, Dental Status Current problems with teeth and/or dentures?: No Does patient usually wear dentures?: No  CIWA:  CIWA-Ar Total: 1 COWS:  COWS Total Score: 2  Musculoskeletal: Strength & Muscle Tone: within normal limits Gait & Station: normal Patient leans: normal  Psychiatric Specialty Exam: Review of Systems  Constitutional: Positive for malaise/fatigue.  HENT: Negative.   Eyes: Negative.   Respiratory: Negative.   Cardiovascular: Negative.   Gastrointestinal: Negative.   Genitourinary: Negative.   Musculoskeletal: Negative.   Skin: Negative.   Neurological: Positive for weakness.  Endo/Heme/Allergies: Negative.   Psychiatric/Behavioral: Positive for depression, suicidal ideas and substance abuse. The patient is nervous/anxious.     Blood pressure 123/60, pulse 88, temperature 97.7 F (36.5 C), temperature source Oral, resp. rate 18, height 5\' 6"  (1.676 m), weight 127.007 kg (280 lb), SpO2 98 %.Body mass index is 45.21 kg/(m^2).  General Appearance:  Fairly Groomed  Patent attorneyye Contact::  Fair  Speech:  Clear and Coherent  Volume:  Decreased  Mood:  Anxious, Depressed, Dysphoric and Hopeless  Affect:  Restricted  Thought Process:  Coherent and Goal Directed  Orientation:  Full (Time, Place, and Person)  Thought Content:  symptoms events worries concerns  Suicidal Thoughts:  Yes.  without intent/plan  Homicidal Thoughts:  No  Memory:  Immediate;   Fair Recent;   Fair Remote;   Fair  Judgement:  Fair  Insight:  Present and Shallow  Psychomotor Activity:  Restlessness  Concentration:  Fair  Recall:  [employer]Fair  Fund of Knowledge:Fair  Language: Fair  Akathisia:  No  Handed:  Right  AIMS (if indicated):     Assets:  Desire for Improvement  ADL's:  Intact  Cognition: WNL  Sleep:  Number of Hours: 6.5   Treatment Plan Summary: Daily contact with patient to assess and evaluate symptoms and progress in treatment and Medication management Supportive approach/coping skills Cocaine dependence; continue to work a relapse prevention plan Depression; continue the Lexapro 10 Mood instability; continue to work with the Lamictal; 25 BID Anxiety/agitation; Neurontin 300 mg (off label) Nightmares; prazosin increase to 2 mg Ruminative thinking; Seroquel 100 mg HS Pursue testosterone testing  Work with CBT/mindfulness  Rachael FeeLUGO,Vance Hochmuth A, MD 07/17/2015, 5:38 PM

## 2015-07-17 NOTE — Tx Team (Signed)
Interdisciplinary Treatment Plan Update (Adult)  Date:  07/17/2015  Time Reviewed:  9:30am  Progress in Treatment: Attending groups: Yes Participating in groups:  Yes Taking medication as prescribed:  Yes. Tolerating medication:  Yes. Family/Significant othe contact made:  SPE completed with pt; as he declined to consent to family contact.  Patient understands diagnosis:  Yes. and As evidenced by:  seeking treatment for Discussing patient identified problems/goals with staff:  Yes. Medical problems stabilized or resolved:  Yes. Denies suicidal/homicidal ideation: No, patient endorsing passive SI  Issues/concerns per patient self-inventory:  Other:  Discharge Plan or Barriers: CSW assessing for appropriate referrals. Pt requested [facility] and [employer]. After this was provided, pt stated that he did not want to pursue either and cannot identify plan at this time. "I'm not sure where I want to go or what I want to do." Pt given information about how to get set up for [facility] services per his request. Pt continues to state that he does not know what to do from here and is resistant to receiving information for placement/oxford houses/etc. He is now isolating in his room and not attending groups.    4/11: Patient interested in residential treatment. [facility] referral pending. Patient provided listing of [place] and information on various recovery programs.  4/14: [place] referrals pending. Patient also interested in [employer] and [employer].  Reason for Continuation of Hospitalization: Depression Hallucinations Medication stabilization  Comments:  Mark Acosta is an 45 y.o. male who presents reporting symptoms of depression and suicidal ideation. Pt has a history of bipolar and has recent stressors of his mom dying 2 years ago and his dad currently dying. He says he has been increasingly depressed and not sleeping for the past 2 months,  and "I pray to god to take me, I am tired of myself". He states that this weekend he relapsed after 14 months sobriety and used 1/2 gram of cocaine in an attempt for his "heart to blow up". He has also thought about slitting his wrist or hanging himself. In 2016, he was about to hang himself with a rope near a tree, when his mom dies, but his father talked him out of it and he went to [facility] for help.Pt reports stopping medication a couple of months ago. He states he was on "something good" from [facility], and the MD at mental health took him off and put him on Trazadone for sleep, which did not work, so he stopped taking it. He hasn't gone back to Op treatment for 6 mo.Pt acknowledges symptoms including crying spells, social withdrawal, loss of interest in usual pleasures, decreased concentration, fatigue, irritability, decreased sleep, decreased appetite and feelings of hopelessness. PT denies homicidal ideation or history of violence. Pt admits to mild auditory and visual hallucinations "hearing bumps and seeing shadows". Pt denies alcohol or substance abuse--although UDS positive for cocaine. Pt lives with a roommate who drinks, and had lived for 10 months in a halfway house for [facility]) and was doing well at that time. His supports include his family, but he states, "They have their own lives". Pt denies history of abuse and trauma. 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 Pt has fair insight and judgement. Diagnosis: Bipolar Disorder  Estimated length of stay:  2-3 days   New goal(s): to develop effective aftercare plan   Additional Comments:  Patient and CSW reviewed pt's identified goals and treatment plan.  Patient verbalized understanding and agreed to treatment plan. CSW reviewed [facility] "Discharge Process and Patient Involvement" Form. Pt verbalized understanding of information provided and signed form.    Review of initial/current patient goals per problem  list:  1. Goal(s): Patient will participate in aftercare plan  Met: No.   Target date: at discharge  As evidenced by: Patient will participate within aftercare plan AEB aftercare provider and housing plan at discharge being identified.  4/4: CSW assessing for appropriate referrals.   4/7 Multiple resources provided to pt; he continues to struggle with committing to a discharge plan. CSW continuing to assess.  4/11: Multiple resources provided to pt; he continues to struggle with committing to a discharge plan. [name] referral pending.  4/14: Goal progressing. Patient has expressed interest in [facility] and [facility].  2. Goal (s): Patient will exhibit decreased depressive symptoms and suicidal ideations.  Met: No   Target date: at discharge  As evidenced by: Patient will utilize self rating of depression at 3 or below and demonstrate decreased signs of depression or be deemed stable for discharge by MD.  4/4: Pt rates depression as high. Denies SI/HI/AVH currently.   4/7: Pt rates depression as high. Passive SI at times (able to contract for safety on the unit).   4/11: Patient rates depression as high and endorses passive SI at this time.   4/14: Goal progressing.      3. Goal(s): Patient will demonstrate decreased signs of withdrawal due to substance abuse  Met:yes  Target date:at discharge   As evidenced by: Patient will produce a CIWA/COWS score of 0, have stable vitals signs, and no symptoms of withdrawal.  4/4: Pt reports mild withdrawals with no COWS and high standing Pulse/Sitting BP.   4/7: Pt reports mild withdrawals with COWS of 1 and stable vitals. Goal progressing.  4/11: Goal met. No withdrawal symptoms reported at this time per medical chart.   Attendees: Patient:    Family:    Physician: Dr. Lugo 07/17/2015 9:30 AM  Nursing: Jennifer Pritchett, Karen Shugart, RN 07/17/2015 9:30 AM  Clinical Social Worker:   , LCSW 07/17/2015 9:30 AM  Other:  07/17/2015 9:30 AM  Other:  07/17/2015 9:30 AM  Other:  07/17/2015 9:30 AM  Other: Aggie Nwoko, NP 07/17/2015 9:30 AM  Other:           , LCSW Clinical Social Worker [facility] [phone]     

## 2015-07-17 NOTE — [facility] Group Notes (Signed)
Patients express his day was a 5. He meet his goals talked to the Dr.

## 2015-07-18 NOTE — Progress Notes (Signed)
D:  Patient's self inventory sheet, patient has fair sleep, sleep medication not helpful.  Good appetite, low energy level, poor concentration.  Rated depression 8, hopeless and anxiety 7.  Denied withdrawals.  SI, contracts for safety.  Physical pain, worst pain #8, back.  Goal is to keep the hope of being better.  Plans to attend activities.  No discharge plans. A:  Medications administered per MD orders.  Emotional support and encouragement given patient. R:  Denied HI.  Denied SI this morning while talking to nurse.  Denied A/V hallucinations.  Contracts for safety.  Safety maintained with 15 minute checks.

## 2015-07-18 NOTE — Progress Notes (Signed)
[facility] MD Progress Note  07/18/2015 10:09 AM Mark Acosta  MRN:  [id number] Subjective: patient reports " I am still not sleeping well, but we just started the seroquel"   Objective:  Mark Acosta is awake, alert and oriented X3 , found resting in bedroom.  Denies suicidal or homicidal ideation. Patient reports auditory hallucination that have quieted with his current medications. Patient denies visual hallucination and does not appear to be responding to internal stimuli. Patient reports interacting  well with staff and others however reports that he likes to be by his self. Patient reports he is medication compliant without mediation side effects. Report learning new coping skills. States his depression 8/10.  Reports good appetite and states he has a hard time staying asleep at night.Support, encouragement and reassurance was provided.   Principal Problem: Severe recurrent major depression without psychotic features (HCC) Diagnosis:   Patient Active Problem List   Diagnosis Date Noted  . Severe recurrent major depression without psychotic features (HCC) [F33.2] 07/07/2015  . Cocaine-induced mood disorder (HCC) [F14.94] 07/06/2015  . Reflux esophagitis [K21.0]   . Diverticulosis of large intestine without hemorrhage [K57.30]   . Hematochezia [K92.1]   . Heme + stool [R19.5] 05/11/2015  . Rectal bleeding [K62.5] 05/11/2015  . Morbid obesity (HCC) [E66.01] 01/24/2015  . Type II diabetes mellitus, uncontrolled (HCC) [E11.65] 01/20/2015  . Essential hypertension, benign [I10] 01/20/2015  . Hyperlipemia [E78.5] 01/20/2015   Total Time spent with patient: 20 minutes  Past Psychiatric History: see admission H and P  Past Medical History:  Past Medical History  Diagnosis Date  . Gout   . Diverticula of colon   . Substance abuse     hx etoh, marijuana, cocaine. quit 02/2014  . Diabetes mellitus without complication (HCC)     boarderline    Past Surgical History  Procedure Laterality Date   . None to date      As of 05/11/15  . Colonoscopy with propofol N/A 06/04/2015    RMR: internal hemorrhoids/anal papilla  . Esophagogastroduodenoscopy (egd) with propofol N/A 06/04/2015    RMR: mild erosive reflux   Family History:  Family History  Problem Relation Age of Onset  . Colon cancer Neg Hx    Family Psychiatric  History: see admission H and P Social History:  History  Alcohol Use No    Comment: hx alcoholism. Now only a drink on special occasions (2-3 times a year)     History  Drug Use  . Yes  . Special: Cocaine, Marijuana    Social History   Social History  . Marital Status: Single    Spouse Name: N/A  . Number of Children: N/A  . Years of Education: N/A   Social History Main Topics  . Smoking status: Never Smoker   . Smokeless tobacco: Never Used  . Alcohol Use: No     Comment: hx alcoholism. Now only a drink on special occasions (2-3 times a year)  . Drug Use: Yes    Special: Cocaine, Marijuana  . Sexual Activity: Not Asked   Other Topics Concern  . None   Social History Narrative   Additional Social History:    Pain Medications: denies currently Prescriptions: denies currently Over the Counter: denies currently History of alcohol / drug use?: Yes Longest period of sobriety (when/how long): 14 months Negative Consequences of Use: Financial, Personal relationships Withdrawal Symptoms: Other (Comment) (None reported) Name of Substance 1: cocaine 1 - Age of First Use: unk 1 - Amount (  size/oz): 1/2 oz 1 - Duration: 1 time 1 - Last Use / Amount: past weekend                  Sleep: Poor- improving some  Appetite:  Fair  Current Medications: Current Facility-Administered Medications  Medication Dose Route Frequency Provider Last Rate Last Dose  . acetaminophen (TYLENOL) tablet 650 mg  650 mg Oral Q6H PRN Thermon LeylandLaura A Davis, NP   650 mg at 07/17/15 1705  . alum & mag hydroxide-simeth (MAALOX/MYLANTA) 200-200-20 MG/5ML suspension 30 mL  30 mL  Oral Q4H PRN Thermon LeylandLaura A Davis, NP      . escitalopram (LEXAPRO) tablet 10 mg  10 mg Oral Daily Rachael FeeIrving A Kyng Matlock, MD   10 mg at 07/18/15 0830  . gabapentin (NEURONTIN) capsule 300 mg  300 mg Oral TID Rachael FeeIrving A Tj Kitchings, MD   300 mg at 07/18/15 0830  . hydrOXYzine (ATARAX/VISTARIL) tablet 50 mg  50 mg Oral QHS PRN Thermon LeylandLaura A Davis, NP   50 mg at 07/15/15 2127  . hydrOXYzine (ATARAX/VISTARIL) tablet 50 mg  50 mg Oral Q6H PRN Rachael FeeIrving A Kirah Stice, MD   50 mg at 07/10/15 1043  . lamoTRIgine (LAMICTAL) tablet 25 mg  25 mg Oral BID Rachael FeeIrving A Ramona Slinger, MD   25 mg at 07/18/15 0830  . lisinopril (PRINIVIL,ZESTRIL) tablet 20 mg  20 mg Oral Daily Rachael FeeIrving A Jami Bogdanski, MD   20 mg at 07/18/15 0829  . LORazepam (ATIVAN) tablet 1 mg  1 mg Oral Q8H PRN Rachael FeeIrving A Felton Buczynski, MD   1 mg at 07/17/15 2138  . magnesium hydroxide (MILK OF MAGNESIA) suspension 30 mL  30 mL Oral Daily PRN Thermon LeylandLaura A Davis, NP      . metFORMIN (GLUCOPHAGE-XR) 24 hr tablet 500 mg  500 mg Oral Q breakfast Rachael FeeIrving A Sparkle Aube, MD   500 mg at 07/18/15 0830  . metoprolol (LOPRESSOR) tablet 50 mg  50 mg Oral BID Rachael FeeIrving A Chandrea Zellman, MD   50 mg at 07/18/15 0831  . pravastatin (PRAVACHOL) tablet 20 mg  20 mg Oral q1800 Rachael FeeIrving A Rexton Greulich, MD   20 mg at 07/17/15 2136  . prazosin (MINIPRESS) capsule 2 mg  2 mg Oral QHS Rachael FeeIrving A Izell Labat, MD   2 mg at 07/17/15 2136  . QUEtiapine (SEROQUEL) tablet 200 mg  200 mg Oral QHS Jomarie LongsSaramma Eappen, MD   200 mg at 07/17/15 2136  . testosterone cypionate (DEPOTESTOSTERONE CYPIONATE) injection 200 mg  200 mg Intramuscular Q14 Days Rachael FeeIrving A Aulden Calise, MD   200 mg at 07/16/15 1546  . traZODone (DESYREL) tablet 100 mg  100 mg Oral QHS PRN Jomarie LongsSaramma Eappen, MD   100 mg at 07/15/15 2127    Lab Results:  Results for orders placed or performed during the hospital encounter of 07/06/15 (from the past 48 hour(s))  Prolactin     Status: Abnormal   Collection Time: 07/16/15  6:15 PM  Result Value Ref Range   Prolactin 17.0 (H) 4.0 - 15.2 ng/mL    Comment: (NOTE) Performed At: [facility]  [place] [address] [place], [place] [id number] Mila HomerHancock William F MD OZ:[id number]:[phone] Performed at [facility]   Luteinizing hormone     Status: None   Collection Time: 07/16/15  6:15 PM  Result Value Ref Range   LH 4.8 1.7 - 8.6 mIU/mL    Comment: (NOTE) Performed At: [facility] [place] [address] [place], [place] [id number] Mila HomerHancock William F MD KG:[id number]:[phone] Performed at [facility]   Follicle stimulating hormone     Status: None   Collection Time: 07/16/15  6:15 PM  Result Value Ref Range   FSH 9.2 1.5 - 12.4 mIU/mL    Comment: (NOTE) Performed At: [facility] [address] [place], [place] [id number] Mila Homer MD UU:[id number] Performed at [facility]     Blood Alcohol level:  Lab Results  Component Value Date   [facility] <5 07/06/2015    Physical Findings: AIMS: Facial and Oral Movements Muscles of Facial Expression: None, normal Lips and Perioral Area: None, normal Jaw: None, normal Tongue: None, normal,Extremity Movements Upper (arms, wrists, hands, fingers): None, normal Lower (legs, knees, ankles, toes): None, normal, Trunk Movements Neck, shoulders, hips: None, normal, Overall Severity Severity of abnormal movements (highest score from questions above): None, normal Incapacitation due to abnormal movements: None, normal Patient's awareness of abnormal movements (rate only patient's report): No Awareness, Dental Status Current problems with teeth and/or dentures?: No Does patient usually wear dentures?: No  CIWA:  CIWA-Ar Total: 1 COWS:  COWS Total Score: 2  Musculoskeletal: Strength & Muscle Tone: within normal limits Gait & Station: normal Patient leans: normal  Psychiatric Specialty Exam: Review of Systems  Constitutional: Positive for malaise/fatigue.  HENT: Negative.   Eyes: Negative.   Respiratory: Negative.   Cardiovascular: Negative.   Gastrointestinal: Negative.    Genitourinary: Negative.   Musculoskeletal: Negative.   Skin: Negative.   Neurological: Positive for weakness.  Endo/Heme/Allergies: Negative.   Psychiatric/Behavioral: Positive for depression, suicidal ideas and substance abuse. The patient is nervous/anxious.     Blood pressure 108/56, pulse 88, temperature 98.4 F (36.9 C), temperature source Oral, resp. rate 18, height  (1.676 m), weight 127.007 kg (280 lb), SpO2 98 %.Body mass index is 45.21 kg/(m^2).  General Appearance: Fairly Groomed and Guarded  Patent attorney::  Fair  Speech:  Clear and Coherent  Volume:  Decreased  Mood:  Depressed and Hopeless  Affect:  Restricted  Thought Process:  Coherent and Goal Directed  Orientation:  Full (Time, Place, and Person)  Thought Content:  symptoms events worries concerns  Suicidal Thoughts:  Yes.  without intent/plan  Homicidal Thoughts:  No  Memory:  Immediate;   Fair Recent;   Fair Remote;   Fair  Judgement:  Fair  Insight:  Present  Psychomotor Activity:  Restlessness  Concentration:  Fair  Recall:  [employer] of Knowledge:Fair  Language: Fair  Akathisia:  No  Handed:  Right  AIMS (if indicated):     Assets:  Desire for Improvement  ADL's:  Intact  Cognition: WNL  Sleep:  Number of Hours: 6.5     I agree with current treatment plan on 07/18/2015, Patient seen face-to-face for psychiatric evaluation follow-up, chart reviewed. Reviewed the information documented and agree with the treatment plan.  Treatment Plan Summary: Daily contact with patient to assess and evaluate symptoms and progress in treatment and Medication management   Supportive approach/coping skills Cocaine dependence; continue to work a relapse prevention plan Depression; continue the Lexapro 10 Mood instability; continue to work with the Lamictal; 25 BID Anxiety/agitation; Neurontin 300 mg (off label) Nightmares; prazosin increase to 2 mg Ruminative thinking; Seroquel 200 mg PO Q HS-improving   Pursue testosterone testing -  Continue with testosterone injection 200 mg  Work with CBT/mindfulness  Oneta Rack, NP 07/18/2015, 10:09 AM I agree with assessment and plan Reymundo Poll. Dub Mikes, M.D.

## 2015-07-18 NOTE — [facility] Group Notes (Signed)
[facility] Group Notes: (Clinical Social Work)   07/18/2015      Type of Therapy:  Group Therapy   Participation Level:  Did Not Attend despite MHT prompting   Mark MantleMareida Grossman-Orr, LCSW 07/18/2015, 12:32 PM

## 2015-07-18 NOTE — Plan of Care (Signed)
Problem: Consults Goal: Suicide Risk Patient Education (See Patient Education module for education specifics)  Outcome: Progressing Nurse discussed suicide thoughts/depression/coping skills with patient.        

## 2015-07-18 NOTE — [facility] Group Notes (Signed)
The focus of this group is to educate the patient on the purpose and policies of crisis stabilization and provide a format to answer questions about their admission.  The group details unit policies and expectations of patients while admitted.  Patient did not attend 0900 nurse education orientation group this morning.  Patient stayed in bed.   

## 2015-07-18 NOTE — Progress Notes (Signed)
D: Pt denies SI/HI/AV hallucinations. Pt is pleasant and cooperative continues to have a flat affect. Pt goal for today is to attend groups and participate. A: Pt was offered support and encouragement. Pt was given scheduled medications. Pt was encourage to attend groups. Q 15 minute checks were done for safety.  R:Pt attends groups and interacts well with peers and staff. Pt is taking medication. Pt has no complaints.Pt receptive to treatment and safety maintained on unit.

## 2015-07-18 NOTE — Progress Notes (Signed)
Patient did attend the evening speaker AA meeting.  

## 2015-07-19 NOTE — Progress Notes (Signed)
Patient attended the evening AA meeting. 

## 2015-07-19 NOTE — [facility] Group Notes (Signed)
[facility] Group Notes: (Clinical Social Work)   07/19/2015      Type of Therapy:  Group Therapy   Participation Level:  Did Not Attend despite MHT prompting   Natali Lavallee Grossman-Orr, LCSW 07/19/2015, 11:34 AM     

## 2015-07-19 NOTE — Progress Notes (Signed)
Patient ID: Mark Acosta, male   DOB: [birth year], 45 y.o.   MRN: [id number]  DAR: Pt. Denies SI/HI and A/V Hallucinations, he is able to contract for safety if feeling unsafe. Her reports sleep is poor, appetite is fair, energy level is low, and concentration is poor. He rates depression and hopelessness 9/10, and anxiety 7/10. Patient does not report any pain or discomfort at this time. Support and encouragement provided to the patient. Scheduled medications administered to patient per physician's orders. Patient reported increased anxiety and received PRN Ativan. Patient is cooperative but minimal with this Clinical research associatewriter. He is seen in the milieu and dayroom watching television but not much interaction with his peers. Q15 minute checks are maintained for safety.

## 2015-07-19 NOTE — Plan of Care (Signed)
Patient noted in day room. No complaints, stable, in no acute distress. Q15 minute rounds and monitoring via security cameras continue for safety. 

## 2015-07-19 NOTE — Progress Notes (Signed)
[facility] MD Progress Note  07/19/2015 12:07 PM Mark Acosta  MRN:  [id number] Subjective: patient reports " I am feeling okay, still not sleeping well and this bed is causing me to have lower back pain."   Objective:  Mark Acosta is awake, alert and oriented X3 , found resting in bedroom.  Denies suicidal or homicidal ideation at this time. Patient reports auditory hallucination that have quieted with his current medications. Patient denies visual hallucination and does not appear to be responding to internal stimuli. Patient reports he is medication compliant without mediation side effects.  States his depression is still 8/10.  Reports good appetite and states he has a hard time staying asleep at night. Patient reports his anxiety is higher and night than during the day. States that he is not able to rest at home either. Patient reports I would like to discuss with Md [place] about this new medication that was just started. Support, encouragement and reassurance was provided.   Principal Problem: Severe recurrent major depression without psychotic features (HCC) Diagnosis:   Patient Active Problem List   Diagnosis Date Noted  . Severe recurrent major depression without psychotic features (HCC) [F33.2] 07/07/2015  . Cocaine-induced mood disorder (HCC) [F14.94] 07/06/2015  . Reflux esophagitis [K21.0]   . Diverticulosis of large intestine without hemorrhage [K57.30]   . Hematochezia [K92.1]   . Heme + stool [R19.5] 05/11/2015  . Rectal bleeding [K62.5] 05/11/2015  . Morbid obesity (HCC) [E66.01] 01/24/2015  . Type II diabetes mellitus, uncontrolled (HCC) [E11.65] 01/20/2015  . Essential hypertension, benign [I10] 01/20/2015  . Hyperlipemia [E78.5] 01/20/2015   Total Time spent with patient: 20 minutes  Past Psychiatric History: see admission H and P  Past Medical History:  Past Medical History  Diagnosis Date  . Gout   . Diverticula of colon   . Substance abuse     hx etoh, marijuana,  cocaine. quit 02/2014  . Diabetes mellitus without complication (HCC)     boarderline    Past Surgical History  Procedure Laterality Date  . None to date      As of 05/11/15  . Colonoscopy with propofol N/A 06/04/2015    RMR: internal hemorrhoids/anal papilla  . Esophagogastroduodenoscopy (egd) with propofol N/A 06/04/2015    RMR: mild erosive reflux   Family History:  Family History  Problem Relation Age of Onset  . Colon cancer Neg Hx    Family Psychiatric  History: see admission H and P Social History:  History  Alcohol Use No    Comment: hx alcoholism. Now only a drink on special occasions (2-3 times a year)     History  Drug Use  . Yes  . Special: Cocaine, Marijuana    Social History   Social History  . Marital Status: Single    Spouse Name: N/A  . Number of Children: N/A  . Years of Education: N/A   Social History Main Topics  . Smoking status: Never Smoker   . Smokeless tobacco: Never Used  . Alcohol Use: No     Comment: hx alcoholism. Now only a drink on special occasions (2-3 times a year)  . Drug Use: Yes    Special: Cocaine, Marijuana  . Sexual Activity: Not Asked   Other Topics Concern  . None   Social History Narrative   Additional Social History:    Pain Medications: denies currently Prescriptions: denies currently Over the Counter: denies currently History of alcohol / drug use?: Yes Longest period of sobriety (when/how  long): 14 months Negative Consequences of Use: Financial, Personal relationships Withdrawal Symptoms: Other (Comment) (None reported) Name of Substance 1: cocaine 1 - Age of First Use: unk 1 - Amount (size/oz): 1/2 oz 1 - Duration: 1 time 1 - Last Use / Amount: past weekend                  Sleep: Poor- improving some  Appetite:  Fair  Current Medications: Current Facility-Administered Medications  Medication Dose Route Frequency Provider Last Rate Last Dose  . acetaminophen (TYLENOL) tablet 650 mg  650 mg Oral  Q6H PRN Thermon Leyland, NP   650 mg at 07/18/15 1134  . alum & mag hydroxide-simeth (MAALOX/MYLANTA) 200-200-20 MG/5ML suspension 30 mL  30 mL Oral Q4H PRN Thermon Leyland, NP      . escitalopram (LEXAPRO) tablet 10 mg  10 mg Oral Daily Rachael Fee, MD   10 mg at 07/19/15 0845  . gabapentin (NEURONTIN) capsule 300 mg  300 mg Oral TID Rachael Fee, MD   300 mg at 07/19/15 1152  . hydrOXYzine (ATARAX/VISTARIL) tablet 50 mg  50 mg Oral QHS PRN Thermon Leyland, NP   50 mg at 07/15/15 2127  . hydrOXYzine (ATARAX/VISTARIL) tablet 50 mg  50 mg Oral Q6H PRN Rachael Fee, MD   50 mg at 07/10/15 1043  . lamoTRIgine (LAMICTAL) tablet 25 mg  25 mg Oral BID Rachael Fee, MD   25 mg at 07/19/15 0845  . lisinopril (PRINIVIL,ZESTRIL) tablet 20 mg  20 mg Oral Daily Rachael Fee, MD   20 mg at 07/19/15 0845  . LORazepam (ATIVAN) tablet 1 mg  1 mg Oral Q8H PRN Rachael Fee, MD   1 mg at 07/19/15 0847  . magnesium hydroxide (MILK OF MAGNESIA) suspension 30 mL  30 mL Oral Daily PRN Thermon Leyland, NP      . metFORMIN (GLUCOPHAGE-XR) 24 hr tablet 500 mg  500 mg Oral Q breakfast Rachael Fee, MD   500 mg at 07/19/15 0845  . metoprolol (LOPRESSOR) tablet 50 mg  50 mg Oral BID Rachael Fee, MD   50 mg at 07/19/15 0845  . pravastatin (PRAVACHOL) tablet 20 mg  20 mg Oral q1800 Rachael Fee, MD   20 mg at 07/18/15 2138  . prazosin (MINIPRESS) capsule 2 mg  2 mg Oral QHS Rachael Fee, MD   2 mg at 07/18/15 2138  . QUEtiapine (SEROQUEL) tablet 200 mg  200 mg Oral QHS Jomarie Longs, MD   200 mg at 07/18/15 2138  . testosterone cypionate (DEPOTESTOSTERONE CYPIONATE) injection 200 mg  200 mg Intramuscular Q14 Days Rachael Fee, MD   200 mg at 07/16/15 1546  . traZODone (DESYREL) tablet 100 mg  100 mg Oral QHS PRN Jomarie Longs, MD   100 mg at 07/18/15 2139    Lab Results:  No results found for this or any previous visit (from the past 48 hour(s)).  Blood Alcohol level:  Lab Results  Component Value Date   ETH <5  07/06/2015    Physical Findings: AIMS: Facial and Oral Movements Muscles of Facial Expression: None, normal Lips and Perioral Area: None, normal Jaw: None, normal Tongue: None, normal,Extremity Movements Upper (arms, wrists, hands, fingers): None, normal Lower (legs, knees, ankles, toes): None, normal, Trunk Movements Neck, shoulders, hips: None, normal, Overall Severity Severity of abnormal movements (highest score from questions above): None, normal Incapacitation due to abnormal movements: None, normal  Patient's awareness of abnormal movements (rate only patient's report): No Awareness, Dental Status Current problems with teeth and/or dentures?: No Does patient usually wear dentures?: No  CIWA:  CIWA-Ar Total: 1 COWS:  COWS Total Score: 2  Musculoskeletal: Strength & Muscle Tone: within normal limits Gait & Station: normal Patient leans: normal  Psychiatric Specialty Exam: Review of Systems  HENT: Negative.   Eyes: Negative.   Respiratory: Negative.   Cardiovascular: Negative.   Gastrointestinal: Negative.   Genitourinary: Negative.   Musculoskeletal: Positive for back pain.  Skin: Negative.   Endo/Heme/Allergies: Negative.   Psychiatric/Behavioral: Positive for depression, suicidal ideas and substance abuse. The patient is nervous/anxious.   All other systems reviewed and are negative.   Blood pressure 92/61, pulse 89, temperature 98 F (36.7 C), temperature source Oral, resp. rate 16, height 5\' 6"  (1.676 m), weight 127.007 kg (280 lb), SpO2 98 %.Body mass index is 45.21 kg/(m^2).  General Appearance: Fairly Groomed  Patent attorneyye Contact::  Fair  Speech:  Clear and Coherent  Volume:  Decreased  Mood:  Depressed and Hopeless  Affect:  Restricted  Thought Process:  Coherent and Goal Directed  Orientation:  Full (Time, Place, and Person)  Thought Content:  symptoms events worries concerns  Suicidal Thoughts:  Yes.  without intent/plan  Homicidal Thoughts:  No  Memory:   Immediate;   Fair Recent;   Fair Remote;   Fair  Judgement:  Fair  Insight:  Present  Psychomotor Activity:  Restlessness-improving   Concentration:  Fair  Recall:  [employer]Fair  Fund of Knowledge:Fair  Language: Fair  Akathisia:  No  Handed:  Right  AIMS (if indicated):     Assets:  Desire for Improvement  ADL's:  Intact  Cognition: WNL  Sleep:  Number of Hours: 5.5     I agree with current treatment plan on 07/19/2015, Patient seen face-to-face for psychiatric evaluation follow-up, chart reviewed. Reviewed the information documented and agree with the treatment plan.  Treatment Plan Summary: Daily contact with patient to assess and evaluate symptoms and progress in treatment and Medication management   Supportive approach/coping skills Cocaine dependence; continue to work a relapse prevention plan Depression; continue the Lexapro 10 mg PO QD Mood instability; continue to work with the Lamictal; 25 BID Anxiety/agitation; Neurontin 300 mg (off label) Nightmares; prazosin increase to 2 mg Ruminative thinking; Seroquel 200 mg PO Q HS  Pursue testosterone testing -  Continue with testosterone injection 200 mg  Work with CBT/mindfulness  Oneta Rackanika N Lewis, NP 07/19/2015, 12:07 PM Agree with NP note as above  Nehemiah MassedFernando Cobos, MD

## 2015-07-20 LAB — GLUCOSE, CAPILLARY: GLUCOSE-CAPILLARY: 170 mg/dL — AB (ref 65–99)

## 2015-07-20 MED ORDER — DOCUSATE SODIUM 100 MG PO CAPS
100.0000 mg | ORAL_CAPSULE | Freq: Every day | ORAL | Status: DC
  Administered 2015-07-20 – 2015-07-22 (×3): 100 mg via ORAL
  Filled 2015-07-20 (×4): qty 1

## 2015-07-20 MED ORDER — GABAPENTIN 300 MG PO CAPS
300.0000 mg | ORAL_CAPSULE | Freq: Every day | ORAL | Status: DC
  Administered 2015-07-20 – 2015-07-21 (×2): 300 mg via ORAL
  Filled 2015-07-20 (×3): qty 1

## 2015-07-20 MED ORDER — TRAZODONE HCL 100 MG PO TABS
200.0000 mg | ORAL_TABLET | Freq: Every day | ORAL | Status: DC
  Administered 2015-07-20 – 2015-07-21 (×2): 200 mg via ORAL
  Filled 2015-07-20 (×3): qty 2

## 2015-07-20 MED ORDER — ESCITALOPRAM OXALATE 5 MG PO TABS
15.0000 mg | ORAL_TABLET | Freq: Every day | ORAL | Status: DC
  Administered 2015-07-21 – 2015-07-22 (×2): 15 mg via ORAL
  Filled 2015-07-20 (×3): qty 1

## 2015-07-20 NOTE — [facility] Group Notes (Signed)
[facility] LCSW Group Therapy  07/20/2015 3:20 PM  Type of Therapy:  Group Therapy  Participation Level:  Active  Participation Quality:  Attentive  Affect:  Appropriate  Cognitive:  Alert and Oriented  Insight:  Improving  Engagement in Therapy:  Improving  Modes of Intervention:  Confrontation, Discussion, Education, Exploration, Problem-solving, Rapport Building, Socialization and Support  Summary of Progress/Problems: Today's Topic: Overcoming Obstacles. Patients identified one short term goal and potential obstacles in reaching this goal. Patients processed barriers involved in overcoming these obstacles. Patients identified steps necessary for overcoming these obstacles and explored motivation (internal and external) for facing these difficulties head on. Lyda JesterCurtis was attentive and engaged during today's processing group. He shared that his biggest obstacle was "dealing with the depression and being patient while my medications take effect." Lyda JesterCurtis shared that he is more hopeful about his future and [facility]. He continues to show progress in the group setting with improving insight.   Smart, Sible Straley LCSW 07/20/2015, 3:20 PM

## 2015-07-20 NOTE — Progress Notes (Signed)
Updated progress notes faxed to Tega at [name] per her request.  Trula SladeHeather Smart, MSW, LCSW Clinical Social Worker 07/20/2015 3:01 PM

## 2015-07-20 NOTE — Progress Notes (Signed)
Patient ID: Mark Acosta, male   DOB: 04/08/1970, 45 y.o.   MRN: [id number]  DAR: Pt. Denies SI/HI and A/V Hallucinations but is able to contract for safety. He reports sleep is poor due to racing thoughts, appetite is good, energy level is low, and concentration is poor. He rates depression 9/10, hopelessness 9/10, and hopelessness 10/10. Patient does report pain in lower back and received PRN medication for this. He also reports anxiety and received PRN Ativan. Support and encouragement provided to the patient. Scheduled medications administered to patient per physician's orders. Patient is minimal but cooperative. EKG done and placed on patient's physical chart. Q15 minute checks are maintained for safety.

## 2015-07-20 NOTE — [facility] Group Notes (Signed)
[facility] LCSW Aftercare Discharge Planning Group Note  07/20/2015  8:45 AM  Participation Quality: Did Not Attend. Patient invited to participate but declined.  Labrenda Lasky, MSW, LCSW Clinical Social Worker [facility] [phone]   

## 2015-07-20 NOTE — Progress Notes (Signed)
Pt attended evening AA group. 

## 2015-07-20 NOTE — Progress Notes (Signed)
[facility] MD Progress Note  07/20/2015 5:55 PM Mark Acosta  MRN:  [id number] Subjective:  Mark Acosta continues to have a hard time. He continues to endorse severe depression with suicidal ruminations. He has not slept well in few days now. Willing to continue the actual regime and give it some longer time. He is wanting to go to rehab as afraid of relapsing and its consequences Principal Problem: Severe recurrent major depression without psychotic features [facility]) Diagnosis:   Patient Active Problem List   Diagnosis Date Noted  . Severe recurrent major depression without psychotic features (HCC) [F33.2] 07/07/2015  . Cocaine-induced mood disorder (HCC) [F14.94] 07/06/2015  . Reflux esophagitis [K21.0]   . Diverticulosis of large intestine without hemorrhage [K57.30]   . Hematochezia [K92.1]   . Heme + stool [R19.5] 05/11/2015  . Rectal bleeding [K62.5] 05/11/2015  . Morbid obesity (HCC) [E66.01] 01/24/2015  . Type II diabetes mellitus, uncontrolled (HCC) [E11.65] 01/20/2015  . Essential hypertension, benign [I10] 01/20/2015  . Hyperlipemia [E78.5] 01/20/2015   Total Time spent with patient: 20 minutes  Past Psychiatric History: see admission H and P  Past Medical History:  Past Medical History  Diagnosis Date  . Gout   . Diverticula of colon   . Substance abuse     hx etoh, marijuana, cocaine. quit 02/2014  . Diabetes mellitus without complication (HCC)     boarderline    Past Surgical History  Procedure Laterality Date  . None to date      As of 05/11/15  . Colonoscopy with propofol N/A 06/04/2015    RMR: internal hemorrhoids/anal papilla  . Esophagogastroduodenoscopy (egd) with propofol N/A 06/04/2015    RMR: mild erosive reflux   Family History:  Family History  Problem Relation Age of Onset  . Colon cancer Neg Hx    Family Psychiatric  History: see admission H and P Social History:  History  Alcohol Use No    Comment: hx alcoholism. Now only a drink on special occasions (2-3  times a year)     History  Drug Use  . Yes  . Special: Cocaine, Marijuana    Social History   Social History  . Marital Status: Single    Spouse Name: N/A  . Number of Children: N/A  . Years of Education: N/A   Social History Main Topics  . Smoking status: Never Smoker   . Smokeless tobacco: Never Used  . Alcohol Use: No     Comment: hx alcoholism. Now only a drink on special occasions (2-3 times a year)  . Drug Use: Yes    Special: Cocaine, Marijuana  . Sexual Activity: Not Asked   Other Topics Concern  . None   Social History Narrative   Additional Social History:    Pain Medications: denies currently Prescriptions: denies currently Over the Counter: denies currently History of alcohol / drug use?: Yes Longest period of sobriety (when/how long): 14 months Negative Consequences of Use: Financial, Personal relationships Withdrawal Symptoms: Other (Comment) (None reported) Name of Substance 1: cocaine 1 - Age of First Use: unk 1 - Amount (size/oz): 1/2 oz 1 - Duration: 1 time 1 - Last Use / Amount: past weekend                  Sleep: Poor  Appetite:  Fair  Current Medications: Current Facility-Administered Medications  Medication Dose Route Frequency Provider Last Rate Last Dose  . acetaminophen (TYLENOL) tablet 650 mg  650 mg Oral Q6H PRN Thermon Leyland,  NP   650 mg at 07/20/15 1140  . alum & mag hydroxide-simeth (MAALOX/MYLANTA) 200-200-20 MG/5ML suspension 30 mL  30 mL Oral Q4H PRN Thermon LeylandLaura A Davis, NP      . docusate sodium (COLACE) capsule 100 mg  100 mg Oral Daily Sanjuana KavaAgnes I Nwoko, NP   100 mg at 07/20/15 1723  . [START ON 07/21/2015] escitalopram (LEXAPRO) tablet 15 mg  15 mg Oral Daily Rachael FeeIrving A Hazleigh Mccleave, MD      . gabapentin (NEURONTIN) capsule 300 mg  300 mg Oral TID Rachael FeeIrving A Akoni Parton, MD   300 mg at 07/20/15 1723  . gabapentin (NEURONTIN) capsule 300 mg  300 mg Oral QHS Rachael FeeIrving A Warwick Nick, MD      . hydrOXYzine (ATARAX/VISTARIL) tablet 50 mg  50 mg Oral QHS PRN  Thermon LeylandLaura A Davis, NP   50 mg at 07/15/15 2127  . hydrOXYzine (ATARAX/VISTARIL) tablet 50 mg  50 mg Oral Q6H PRN Rachael FeeIrving A Duane Trias, MD   50 mg at 07/10/15 1043  . lamoTRIgine (LAMICTAL) tablet 25 mg  25 mg Oral BID Rachael FeeIrving A Eustace Hur, MD   25 mg at 07/20/15 1723  . lisinopril (PRINIVIL,ZESTRIL) tablet 20 mg  20 mg Oral Daily Rachael FeeIrving A Tzivia Oneil, MD   20 mg at 07/20/15 [phone]  . LORazepam (ATIVAN) tablet 1 mg  1 mg Oral Q8H PRN Rachael FeeIrving A Clothilde Tippetts, MD   1 mg at 07/20/15 1140  . magnesium hydroxide (MILK OF MAGNESIA) suspension 30 mL  30 mL Oral Daily PRN Thermon LeylandLaura A Davis, NP      . metFORMIN (GLUCOPHAGE-XR) 24 hr tablet 500 mg  500 mg Oral Q breakfast Rachael FeeIrving A Sohum Delillo, MD   500 mg at 07/20/15 0835  . metoprolol (LOPRESSOR) tablet 50 mg  50 mg Oral BID Rachael FeeIrving A Muhammed Teutsch, MD   50 mg at 07/20/15 [phone]  . pravastatin (PRAVACHOL) tablet 20 mg  20 mg Oral q1800 Rachael FeeIrving A Alantra Popoca, MD   20 mg at 07/19/15 2144  . prazosin (MINIPRESS) capsule 2 mg  2 mg Oral QHS Rachael FeeIrving A Anica Alcaraz, MD   2 mg at 07/19/15 2145  . testosterone cypionate (DEPOTESTOSTERONE CYPIONATE) injection 200 mg  200 mg Intramuscular Q14 Days Rachael FeeIrving A Jaystin Mcgarvey, MD   200 mg at 07/16/15 1546  . traZODone (DESYREL) tablet 200 mg  200 mg Oral QHS Rachael FeeIrving A Kayna Suppa, MD        Lab Results:  Results for orders placed or performed during the hospital encounter of 07/06/15 (from the past 48 hour(s))  Glucose, capillary     Status: Abnormal   Collection Time: 07/20/15 11:50 AM  Result Value Ref Range   Glucose-Capillary 170 (H) 65 - 99 mg/dL    Blood Alcohol level:  Lab Results  Component Value Date   ETH <5 07/06/2015    Physical Findings: AIMS: Facial and Oral Movements Muscles of Facial Expression: None, normal Lips and Perioral Area: None, normal Jaw: None, normal Tongue: None, normal,Extremity Movements Upper (arms, wrists, hands, fingers): None, normal Lower (legs, knees, ankles, toes): None, normal, Trunk Movements Neck, shoulders, hips: None, normal, Overall Severity Severity of  abnormal movements (highest score from questions above): None, normal Incapacitation due to abnormal movements: None, normal Patient's awareness of abnormal movements (rate only patient's report): No Awareness, Dental Status Current problems with teeth and/or dentures?: No Does patient usually wear dentures?: No  CIWA:  CIWA-Ar Total: 1 COWS:  COWS Total Score: 2  Musculoskeletal: Strength & Muscle Tone: within normal limits Gait & Station:  normal Patient leans: normal  Psychiatric Specialty Exam: Review of Systems  Constitutional: Positive for malaise/fatigue.  HENT: Negative.   Eyes: Negative.   Respiratory: Negative.   Cardiovascular: Negative.   Gastrointestinal: Negative.   Genitourinary: Negative.   Musculoskeletal: Negative.   Skin: Negative.   Neurological: Positive for weakness.  Endo/Heme/Allergies: Negative.   Psychiatric/Behavioral: Positive for depression, suicidal ideas and substance abuse. The patient is nervous/anxious.     Blood pressure 139/83, pulse 88, temperature 98 F (36.7 C), temperature source Oral, resp. rate 16, height  (1.676 m), weight 127.007 kg (280 lb), SpO2 98 %.Body mass index is 45.21 kg/(m^2).  General Appearance: Fairly Groomed  Patent attorney::  Fair  Speech:  Clear and Coherent  Volume:  Decreased  Mood:  Anxious, Depressed, Dysphoric and Hopeless  Affect:  Restricted  Thought Process:  Coherent and Goal Directed  Orientation:  Full (Time, Place, and Person)  Thought Content:  symptoms events worries concerns  Suicidal Thoughts:  Yes.  without intent/plan  Homicidal Thoughts:  No  Memory:  Immediate;   Fair Recent;   Fair Remote;   Fair  Judgement:  Fair  Insight:  Present and Shallow  Psychomotor Activity:  Restlessness  Concentration:  Fair  Recall:  [employer] of Knowledge:Fair  Language: Fair  Akathisia:  No  Handed:  Right  AIMS (if indicated):     Assets:  Desire for Improvement  ADL's:  Intact  Cognition: WNL   Sleep:  Number of Hours: 6.75   Treatment Plan Summary: Daily contact with patient to assess and evaluate symptoms and progress in treatment and Medication management Supportive approach/coping skills Cocaine dependence; continue to work a relapse prevention plan Depression; increase  the Lexapro 15 mg  Mood instability; continue to work with the Lamictal; 25 BID Anxiety/agitation; Neurontin 300 mg (off label) Nightmares; prazosin increase to 2 mg Ruminative thinking; Does not feel the Seroquel is helping, will D/C it.  Pursue testosterone testing  Work with CBT/mindfulness  Rachael Fee, MD 07/20/2015, 5:55 PM

## 2015-07-21 ENCOUNTER — Encounter: Payer: Self-pay | Admitting: Internal Medicine

## 2015-07-21 LAB — GLUCOSE, CAPILLARY: Glucose-Capillary: 169 mg/dL — ABNORMAL HIGH (ref 65–99)

## 2015-07-21 MED ORDER — ESCITALOPRAM OXALATE 5 MG PO TABS: 15.0000 mg | ORAL_TABLET | Freq: Every day | ORAL | Status: DC

## 2015-07-21 MED ORDER — METOPROLOL TARTRATE 50 MG PO TABS: 50.0000 mg | ORAL_TABLET | Freq: Two times a day (BID) | ORAL | Status: DC

## 2015-07-21 MED ORDER — GABAPENTIN 300 MG PO CAPS: 300.0000 mg | ORAL_CAPSULE | Freq: Every day | ORAL | Status: DC

## 2015-07-21 MED ORDER — METFORMIN HCL ER 500 MG PO TB24: 500.0000 mg | ORAL_TABLET | Freq: Every day | ORAL | Status: DC

## 2015-07-21 MED ORDER — TESTOSTERONE CYPIONATE 200 MG/ML IM SOLN: 200.0000 mg | INTRAMUSCULAR | Status: DC

## 2015-07-21 MED ORDER — PRAVASTATIN SODIUM 20 MG PO TABS: 20.0000 mg | ORAL_TABLET | Freq: Every day | ORAL | Status: DC

## 2015-07-21 MED ORDER — HYDROXYZINE HCL 50 MG PO TABS: 50.0000 mg | ORAL_TABLET | Freq: Four times a day (QID) | ORAL | Status: DC | PRN

## 2015-07-21 MED ORDER — LISINOPRIL 20 MG PO TABS: 20.0000 mg | ORAL_TABLET | Freq: Every day | ORAL | Status: DC

## 2015-07-21 MED ORDER — PRAZOSIN HCL 2 MG PO CAPS: 2.0000 mg | ORAL_CAPSULE | Freq: Every day | ORAL | Status: DC

## 2015-07-21 MED ORDER — TRAZODONE HCL 100 MG PO TABS: 200.0000 mg | ORAL_TABLET | Freq: Every day | ORAL | Status: DC

## 2015-07-21 MED ORDER — LAMOTRIGINE 25 MG PO TABS: 25.0000 mg | ORAL_TABLET | Freq: Two times a day (BID) | ORAL | Status: DC

## 2015-07-21 MED ORDER — DOCUSATE SODIUM 100 MG PO CAPS: 100.0000 mg | ORAL_CAPSULE | Freq: Every day | ORAL | Status: DC

## 2015-07-21 NOTE — Tx Team (Signed)
Interdisciplinary Treatment Plan Update (Adult)  Date:  07/21/2015  Time Reviewed:  9:30am  Progress in Treatment: Attending groups: Yes Participating in groups:  Yes Taking medication as prescribed:  Yes. Tolerating medication:  Yes. Family/Significant othe contact made:  SPE completed with pt; as he declined to consent to family contact.  Patient understands diagnosis:  Yes. and As evidenced by:  seeking treatment for Discussing patient identified problems/goals with staff:  Yes. Medical problems stabilized or resolved:  Yes. Denies suicidal/homicidal ideation: No, patient endorsing passive SI  Issues/concerns per patient self-inventory:  Other:  Discharge Plan or Barriers:Pt accepted to [facility] for Wed. IVCed for safe transport. Sgt Pascal is aware and will have deputy pick up pt between 8am-9am on Wed 07/22/15. Admitting doc per [facility]: Dr. Darral Dash. Pt is hoping to followup with [facility] from there.   Reason for Continuation of Hospitalization: Medication management   Comments:  Mark Acosta is an 45 y.o. male who presents reporting symptoms of depression and suicidal ideation. Pt has a history of bipolar and has recent stressors of his mom dying 2 years ago and his dad currently dying. He says he has been increasingly depressed and not sleeping for the past 2 months, and "I pray to god to take me, I am tired of myself". He states that this weekend he relapsed after 14 months sobriety and used 1/2 gram of cocaine in an attempt for his "heart to blow up". He has also thought about slitting his wrist or hanging himself. In 2016, he was about to hang himself with a rope near a tree, when his mom dies, but his father talked him out of it and [facility].Pt reports stopping medication a couple of months ago. He states he was on "something good" from [facility], and the MD at mental health took him off and put him on Trazadone for sleep, which did not work, so he stopped  taking it. He hasn't gone back to Op treatment for 6 mo.Pt acknowledges symptoms including crying spells, social withdrawal, loss of interest in usual pleasures, decreased concentration, fatigue, irritability, decreased sleep, decreased appetite and feelings of hopelessness. PT denies homicidal ideation or history of violence. Pt admits to mild auditory and visual hallucinations "hearing bumps and seeing shadows". Pt denies alcohol or substance abuse--although UDS positive for cocaine. Pt lives with a roommate who drinks, and had lived for 10 months in a halfway house for [facility]) and was doing well at that time. His supports include his family, but he states, "They have their own lives". Pt denies history of abuse and trauma. 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 Pt has fair insight and judgement. Diagnosis: Bipolar Disorder  Estimated length of stay:  D/c tomorrow morning   New goal(s): to develop effective aftercare plan   Additional Comments:  Patient and CSW reviewed pt's identified goals and treatment plan. Patient verbalized understanding and agreed to treatment plan. CSW reviewed [facility] "Discharge Process and Patient Involvement" Form. Pt verbalized understanding of information provided and signed form.    Review of initial/current patient goals per problem list:  1. Goal(s): Patient will participate in aftercare plan  Met: Yes  Target date: at discharge  As evidenced by: Patient will participate within aftercare plan AEB aftercare provider and housing plan at discharge being identified.  4/4: CSW assessing for appropriate referrals.   4/7 Multiple resources provided to pt; he continues to struggle with committing to a  discharge plan. CSW continuing to assess.  4/11: Multiple resources provided to pt; he continues to struggle with committing to a discharge plan. [name] referral pending.  4/14: Goal progressing. Patient has expressed interest in  [employer] and [facility]. 4/18: Goal met. [name] referral met.   2. Goal (s): Patient will exhibit decreased depressive symptoms and suicidal ideations.  Met: Yes   Target date: at discharge  As evidenced by: Patient will utilize self rating of depression at 3 or below and demonstrate decreased signs of depression or be deemed stable for discharge by MD.  4/4: Pt rates depression as high. Denies SI/HI/AVH currently.   4/7: Pt rates depression as high. Passive SI at times (able to contract for safety on the unit).   4/11: Patient rates depression as high and endorses passive SI at this time.   4/14: Goal progressing.   4/18: Pt adequate for discharge. Pt rates depression as high. Passive SI at times but is able to contract for safety.      3. Goal(s): Patient will demonstrate decreased signs of withdrawal due to substance abuse  Met:yes  Target date:at discharge   As evidenced by: Patient will produce a CIWA/COWS score of 0, have stable vitals signs, and no symptoms of withdrawal.  4/4: Pt reports mild withdrawals with no COWS and high standing Pulse/Sitting BP.   4/7: Pt reports mild withdrawals with COWS of 1 and stable vitals. Goal progressing.  4/11: Goal met. No withdrawal symptoms reported at this time per medical chart.   Attendees: Patient:    Family:    Physician: Dr. Sabra Heck 07/21/2015 2:54 PM   Nursing: Sherrine Maples RN 07/21/2015 2:54 PM   Clinical Social Worker: Tilden Fossa, LCSW 07/21/2015 2:54 PM   Other: Maxie Better, LCSW 07/21/2015 2:54 PM   Other:    Other:    Other: Agustina Caroli, NP 07/21/2015 2:54 PM   Other:          Maxie Better, MSW, LCSW Clinical Social Worker 07/21/2015 2:54 PM

## 2015-07-21 NOTE — Plan of Care (Signed)
Problem: Diagnosis: Increased Risk For Suicide Attempt Goal: STG-Patient Will Comply With Medication Regime Outcome: Progressing Pt compliant with medication regime     

## 2015-07-21 NOTE — BHH Suicide Risk Assessment (Signed)
[facility] Discharge Suicide Risk Assessment   Principal Problem: Severe recurrent major depression without psychotic features [facility]) Discharge Diagnoses:  Patient Active Problem List   Diagnosis Date Noted  . Severe recurrent major depression without psychotic features (HCC) [F33.2] 07/07/2015  . Cocaine-induced mood disorder (HCC) [F14.94] 07/06/2015  . Reflux esophagitis [K21.0]   . Diverticulosis of large intestine without hemorrhage [K57.30]   . Hematochezia [K92.1]   . Heme + stool [R19.5] 05/11/2015  . Rectal bleeding [K62.5] 05/11/2015  . Morbid obesity (HCC) [E66.01] 01/24/2015  . Type II diabetes mellitus, uncontrolled (HCC) [E11.65] 01/20/2015  . Essential hypertension, benign [I10] 01/20/2015  . Hyperlipemia [E78.5] 01/20/2015    Total Time spent with patient: 20 minutes  Musculoskeletal: Strength & Muscle Tone: within normal limits Gait & Station: normal Patient leans: normal  Psychiatric Specialty Exam: Review of Systems  Constitutional: Positive for malaise/fatigue.  HENT: Negative.   Eyes: Negative.   Respiratory: Negative.   Cardiovascular: Negative.   Gastrointestinal: Negative.   Genitourinary: Negative.   Musculoskeletal: Negative.   Skin: Negative.   Neurological: Positive for weakness.  Endo/Heme/Allergies: Negative.   Psychiatric/Behavioral: Positive for depression and substance abuse. The patient is nervous/anxious.     Blood pressure 113/68, pulse 80, temperature 97.7 F (36.5 C), temperature source Oral, resp. rate 16, height  (1.676 m), weight 127.007 kg (280 lb), SpO2 98 %.Body mass index is 45.21 kg/(m^2).  General Appearance: Fairly Groomed  Patent attorney::  Fair  Speech:  Clear and Coherent409  Volume:  Decreased  Mood:  Anxious and Depressed  Affect:  Restricted  Thought Process:  Coherent and Goal Directed  Orientation:  Full (Time, Place, and Person)  Thought Content:  symptoms events worries concerns  Suicidal Thoughts:  No   Homicidal Thoughts:  No  Memory:  Immediate;   Fair Recent;   Fair Remote;   Fair  Judgement:  Fair  Insight:  Present  Psychomotor Activity:  Decreased  Concentration:  Fair  Recall:  [employer] of Knowledge:Fair  Language: Fair  Akathisia:  No  Handed:  Right  AIMS (if indicated):     Assets:  Desire for Improvement  Sleep:  Number of Hours: 6  Cognition: WNL  ADL's:  Intact  In full contact with reality. Willing and motivated to pursue residential treatment at [facility] Mental Status Per Nursing Assessment::   On Admission:  Suicidal ideation indicated by patient  Demographic Factors:  Male and Caucasian  Loss Factors: Decrease in vocational status, Decline in physical health and Financial problems/change in socioeconomic status  Historical Factors: none identified  Risk Reduction Factors:   Religious beliefs about death  Continued Clinical Symptoms:  Depression:   Comorbid alcohol abuse/dependence Severe Alcohol/Substance Abuse/Dependencies  Cognitive Features That Contribute To Risk:  Closed-mindedness, Polarized thinking and Thought constriction (tunnel vision)    Suicide Risk:  Moderate:  Frequent suicidal ideation with limited intensity, and duration, some specificity in terms of plans, no associated intent, good self-control, limited dysphoria/symptomatology, some risk factors present, and identifiable protective factors, including available and accessible social support.  Follow-up Information    Follow up with [facility].   Why:  Referral faxed: 07/20/15. Please follow-up with facility after discharge if you are still interested in attending in order to schedule screening.    Contact information:   [address] [place], [place] [address] Phone: [phone] Fax: [phone]      Follow up with [name] On 07/22/2015.   Why:  You have been accepted  for admission on this date. [place] sherriff[facility] dept will transport you to facility between  8am-9am on Wed.    Contact information:   [address]  [place], [place] [address] Phone: [phone] Fax: [phone]      Plan Of Care/Follow-up recommendations:  Activity:  as tolerated Diet:  regular Follow up as above Lakena Sparlin A, MD 07/21/2015, 5:56 PM

## 2015-07-21 NOTE — Plan of Care (Signed)
Problem: Alteration in thought process Goal: LTG-Patient has not harmed self or others in at least 2 days Outcome: Completed/Met Date Met:  07/21/15 Pt has not harmed self or others in the past 2 days

## 2015-07-21 NOTE — BHH Group Notes (Signed)
[facility] Group Notes:  (Nursing/MHT/Case Management/Adjunct)  Date:  07/21/2015  Time:  1030 am  Type of Therapy:  Psychoeducational Skills  Participation Level:  Did Not Attend  Patient invited; declined to attend  Mark Acosta, Mark Acosta 07/21/2015, 1:34 PM

## 2015-07-21 NOTE — Progress Notes (Signed)
D: Patient continues to remain the same.  He continues to express passive suicidal thoughts without any specific plan.  He rates his depression and hopelessness as an 8; anxiety as a 9.  Patient presents with flat, blunted affect.  Patient continues to complain of lack of sleep due to "racing thoughts."  He denies HI/AVH.  Patient states his going to "[facility] or [facility]."  He states, "my anxiety is still high."   A: Continue to monitor medication management and MD orders.  Safety checks completed every 15 minutes per protocol.  Offer support and encouragement as needed. R: Patient is receptive to staff; his behavior is appropriate.

## 2015-07-21 NOTE — Progress Notes (Signed)
Voicemail left for Mark Acosta requesting update on referral for this patient. [facility]).  Trula SladeHeather Smart, MSW, LCSW Clinical Social Worker 07/21/2015 8:27 AM

## 2015-07-21 NOTE — B[facility] Group Notes (Signed)
[facility] LCSW Group Therapy  07/21/2015 1:25 PM  Type of Therapy:  Group Therapy  Participation Level:  Active  Participation Quality:  Attentive  Affect:  Appropriate  Cognitive:  Alert and Oriented  Insight:  Improving  Engagement in Therapy:  Engaged  Modes of Intervention:  Confrontation, Discussion, Education, Exploration, Problem-solving, Rapport Building, Socialization and Support  Summary of Progress/Problems: MHA Speaker came to talk about his personal journey with substance abuse and addiction. The pt processed ways by which to relate to the speaker. MHA speaker provided handouts and educational information pertaining to groups and services offered by [facility].   Smart, Mark Baby LCSW 07/21/2015, 1:25 PM

## 2015-07-21 NOTE — Progress Notes (Signed)
Patient ID: Mark Acosta, male   DOB: 1970/09/07, 45 y.o.   MRN: [id number] D: Patient reports not sleeping well for couple days. Pt is guarded and minimal with interaction. C/o anxiety, PRN medication given. Denies SI/HI/AVH and pain. Attended and participated in evening AA group.No behavioral issues noted.  A: Support and encouragement offered as needed. Medications administered as prescribed.  R: Patient cooperative and appropriate on unit. Will continue to monitor patient for safety and stability.

## 2015-07-21 NOTE — Progress Notes (Signed)
[facility] MD Progress Note  07/21/2015 5:51 PM Mark Acosta  MRN:  [id number] Subjective:  Mark Acosta continues to have a hard time. He continues to endorse severe depression  He has not slept well in few days now. He wants to go to rehab as concerned about his ability to stay sober.  Principal Problem: Severe recurrent major depression without psychotic features (HCC) Diagnosis:   Patient Active Problem List   Diagnosis Date Noted  . Severe recurrent major depression without psychotic features (HCC) [F33.2] 07/07/2015  . Cocaine-induced mood disorder (HCC) [F14.94] 07/06/2015  . Reflux esophagitis [K21.0]   . Diverticulosis of large intestine without hemorrhage [K57.30]   . Hematochezia [K92.1]   . Heme + stool [R19.5] 05/11/2015  . Rectal bleeding [K62.5] 05/11/2015  . Morbid obesity (HCC) [E66.01] 01/24/2015  . Type II diabetes mellitus, uncontrolled (HCC) [E11.65] 01/20/2015  . Essential hypertension, benign [I10] 01/20/2015  . Hyperlipemia [E78.5] 01/20/2015   Total Time spent with patient: 20 minutes  Past Psychiatric History: see admission H and P  Past Medical History:  Past Medical History  Diagnosis Date  . Gout   . Diverticula of colon   . Substance abuse     hx etoh, marijuana, cocaine. quit 02/2014  . Diabetes mellitus without complication (HCC)     boarderline    Past Surgical History  Procedure Laterality Date  . None to date      As of 05/11/15  . Colonoscopy with propofol N/A 06/04/2015    RMR: internal hemorrhoids/anal papilla  . Esophagogastroduodenoscopy (egd) with propofol N/A 06/04/2015    RMR: mild erosive reflux   Family History:  Family History  Problem Relation Age of Onset  . Colon cancer Neg Hx    Family Psychiatric  History: see admission H and P Social History:  History  Alcohol Use No    Comment: hx alcoholism. Now only a drink on special occasions (2-3 times a year)     History  Drug Use  . Yes  . Special: Cocaine, Marijuana    Social  History   Social History  . Marital Status: Single    Spouse Name: N/A  . Number of Children: N/A  . Years of Education: N/A   Social History Main Topics  . Smoking status: Never Smoker   . Smokeless tobacco: Never Used  . Alcohol Use: No     Comment: hx alcoholism. Now only a drink on special occasions (2-3 times a year)  . Drug Use: Yes    Special: Cocaine, Marijuana  . Sexual Activity: Not Asked   Other Topics Concern  . None   Social History Narrative   Additional Social History:    Pain Medications: denies currently Prescriptions: denies currently Over the Counter: denies currently History of alcohol / drug use?: Yes Longest period of sobriety (when/how long): 14 months Negative Consequences of Use: Financial, Personal relationships Withdrawal Symptoms: Other (Comment) (None reported) Name of Substance 1: cocaine 1 - Age of First Use: unk 1 - Amount (size/oz): 1/2 oz 1 - Duration: 1 time 1 - Last Use / Amount: past weekend                  Sleep: Poor  Appetite:  Fair  Current Medications: Current Facility-Administered Medications  Medication Dose Route Frequency Provider Last Rate Last Dose  . acetaminophen (TYLENOL) tablet 650 mg  650 mg Oral Q6H PRN Thermon Leyland, NP   650 mg at 07/20/15 1140  . alum & mag  hydroxide-simeth (MAALOX/MYLANTA) 200-200-20 MG/5ML suspension 30 mL  30 mL Oral Q4H PRN Thermon LeylandLaura A Davis, NP      . docusate sodium (COLACE) capsule 100 mg  100 mg Oral Daily Sanjuana KavaAgnes I Nwoko, NP   100 mg at 07/21/15 0837  . escitalopram (LEXAPRO) tablet 15 mg  15 mg Oral Daily Rachael FeeIrving A Samreet Edenfield, MD   15 mg at 07/21/15 0837  . gabapentin (NEURONTIN) capsule 300 mg  300 mg Oral TID Rachael FeeIrving A Raneen Jaffer, MD   300 mg at 07/21/15 1708  . gabapentin (NEURONTIN) capsule 300 mg  300 mg Oral QHS Rachael FeeIrving A Jadene Stemmer, MD   300 mg at 07/20/15 2144  . hydrOXYzine (ATARAX/VISTARIL) tablet 50 mg  50 mg Oral QHS PRN Thermon LeylandLaura A Davis, NP   50 mg at 07/15/15 2127  . hydrOXYzine  (ATARAX/VISTARIL) tablet 50 mg  50 mg Oral Q6H PRN Rachael FeeIrving A Dequarius Jeffries, MD   50 mg at 07/10/15 1043  . lamoTRIgine (LAMICTAL) tablet 25 mg  25 mg Oral BID Rachael FeeIrving A Miyeko Mahlum, MD   25 mg at 07/21/15 1708  . lisinopril (PRINIVIL,ZESTRIL) tablet 20 mg  20 mg Oral Daily Rachael FeeIrving A Sumner Boesch, MD   20 mg at 07/21/15 0837  . LORazepam (ATIVAN) tablet 1 mg  1 mg Oral Q8H PRN Rachael FeeIrving A Nyra Anspaugh, MD   1 mg at 07/21/15 1116  . magnesium hydroxide (MILK OF MAGNESIA) suspension 30 mL  30 mL Oral Daily PRN Thermon LeylandLaura A Davis, NP      . metFORMIN (GLUCOPHAGE-XR) 24 hr tablet 500 mg  500 mg Oral Q breakfast Rachael FeeIrving A Jakeya Gherardi, MD   500 mg at 07/21/15 0837  . metoprolol (LOPRESSOR) tablet 50 mg  50 mg Oral BID Rachael FeeIrving A Amr Sturtevant, MD   50 mg at 07/21/15 0837  . pravastatin (PRAVACHOL) tablet 20 mg  20 mg Oral q1800 Rachael FeeIrving A Shikira Folino, MD   20 mg at 07/20/15 2144  . prazosin (MINIPRESS) capsule 2 mg  2 mg Oral QHS Rachael FeeIrving A Rosario Kushner, MD   2 mg at 07/20/15 2144  . testosterone cypionate (DEPOTESTOSTERONE CYPIONATE) injection 200 mg  200 mg Intramuscular Q14 Days Rachael FeeIrving A Ellarose Brandi, MD   200 mg at 07/16/15 1546  . traZODone (DESYREL) tablet 200 mg  200 mg Oral QHS Rachael FeeIrving A Makiah Clauson, MD   200 mg at 07/20/15 2144    Lab Results:  Results for orders placed or performed during the hospital encounter of 07/06/15 (from the past 48 hour(s))  Glucose, capillary     Status: Abnormal   Collection Time: 07/20/15 11:50 AM  Result Value Ref Range   Glucose-Capillary 170 (H) 65 - 99 mg/dL  Glucose, capillary     Status: Abnormal   Collection Time: 07/21/15  6:17 AM  Result Value Ref Range   Glucose-Capillary 169 (H) 65 - 99 mg/dL    Blood Alcohol level:  Lab Results  Component Value Date   ETH <5 07/06/2015    Physical Findings: AIMS: Facial and Oral Movements Muscles of Facial Expression: None, normal Lips and Perioral Area: None, normal Jaw: None, normal Tongue: None, normal,Extremity Movements Upper (arms, wrists, hands, fingers): None, normal Lower (legs, knees,  ankles, toes): None, normal, Trunk Movements Neck, shoulders, hips: None, normal, Overall Severity Severity of abnormal movements (highest score from questions above): None, normal Incapacitation due to abnormal movements: None, normal Patient's awareness of abnormal movements (rate only patient's report): No Awareness, Dental Status Current problems with teeth and/or dentures?: No Does patient usually wear dentures?: No  CIWA:  CIWA-Ar Total: 1 COWS:  COWS Total Score: 2  Musculoskeletal: Strength & Muscle Tone: within normal limits Gait & Station: normal Patient leans: normal  Psychiatric Specialty Exam: Review of Systems  Constitutional: Positive for malaise/fatigue.  HENT: Negative.   Eyes: Negative.   Respiratory: Negative.   Cardiovascular: Negative.   Gastrointestinal: Negative.   Genitourinary: Negative.   Musculoskeletal: Negative.   Skin: Negative.   Neurological: Positive for weakness.  Endo/Heme/Allergies: Negative.   Psychiatric/Behavioral: Positive for depression, suicidal ideas and substance abuse. The patient is nervous/anxious.     Blood pressure 113/68, pulse 80, temperature 97.7 F (36.5 C), temperature source Oral, resp. rate 16, height  (1.676 m), weight 127.007 kg (280 lb), SpO2 98 %.Body mass index is 45.21 kg/(m^2).  General Appearance: Fairly Groomed  Patent attorney::  Fair  Speech:  Clear and Coherent  Volume:  Decreased  Mood:  Anxious, Depressed, Dysphoric and Hopeless  Affect:  Restricted  Thought Process:  Coherent and Goal Directed  Orientation:  Full (Time, Place, and Person)  Thought Content:  symptoms events worries concerns  Suicidal Thoughts:  Occasional ruminations   Homicidal Thoughts:  No  Memory:  Immediate;   Fair Recent;   Fair Remote;   Fair  Judgement:  Fair  Insight:  Present and Shallow  Psychomotor Activity:  Restlessness  Concentration:  Fair  Recall:  [employer] of Knowledge:Fair  Language: Fair  Akathisia:  No   Handed:  Right  AIMS (if indicated):     Assets:  Desire for Improvement  ADL's:  Intact  Cognition: WNL  Sleep:  Number of Hours: 6   Treatment Plan Summary: Daily contact with patient to assess and evaluate symptoms and progress in treatment and Medication management Supportive approach/coping skills Cocaine dependence; continue to work a relapse prevention plan Depression; continue   the Lexapro 15 mg  Mood instability; continue to work with the Lamictal; 25 BID Anxiety/agitation; Neurontin 300 mg (off label) Nightmares; prazosin  to 2 mg Hypogonadism; continue testosterone replacement  Work with CBT/mindfulness Facilitate admission to [facility] in the morning Ezequias Lard A, MD 07/21/2015, 5:51 PM

## 2015-07-21 NOTE — Progress Notes (Signed)
D: Pt was in the day room upon initial approach.  Pt has depressed affect and mood.  He reports his goal today was "to talk to the doctor and social worker about discharge."  Pt reports he is discharging in the morning and that he feels safe to do so.  Pt denies SI/HI, denies hallucinations, reports chronic back pain of 5/10.  Pt has been visible in milieu interacting with peers and staff appropriately.   A: Introduced self to pt.  Met with pt and offered support and encouragement.  Actively listened to pt.  Medications administered per order.  PRN medication administered for sleep and pain. R: Pt is compliant with medications.  Pt verbally contracts for safety and reports that he will inform staff of needs and concerns.  Will continue to monitor and assess.

## 2015-07-21 NOTE — Progress Notes (Signed)
Recreation Therapy Notes  Animal-Assisted Activity (AAA) Program Checklist/Progress Notes Patient Eligibility Criteria Checklist & Daily Group note for Rec Tx Intervention  Date: 07/21/15 Time: 1445 [address] [address]   AAA/T Program Assumption of Risk Form signed by Patient/ or Parent Legal Guardian yes  Patient is free of allergies or sever asthma yes  Patient reports no fear of animals yes  Patient reports no history of cruelty to animalsyes  Patient understands his/her participation is voluntary yes  Patient washes hands before animal contact yes  Patient washes hands after animal contact yes  Education: Hand Washing, Appropriate Animal Interaction   Education Outcome: Acknowledges understanding/In group clarification offered/Needs additional education.   Clinical Observations/Feedback:  Pt did not attend group.   [name], [name]         [name], [name] 07/21/2015 3:42 PM

## 2015-07-21 NOTE — Discharge Summary (Signed)
Physician Discharge Summary Note  Patient:  Mark Acosta is an 45 y.o., male MRN:  [id number] DOB:  1970-12-05 Patient phone:  [phone] (home)  Patient address:   [place]   [address],  Total Time spent with patient: Greater than 30 minutes  Date of Admission:  07/06/2015  Date of Discharge: 07-21-15  Reason for Admission:  Worsening symptoms of depression.  Principal Problem: Severe recurrent major depression without psychotic features [facility])  Discharge Diagnoses: Patient Active Problem List   Diagnosis Date Noted  . Severe recurrent major depression without psychotic features ([place]) [F33.2] 07/07/2015  . Cocaine-induced mood disorder ([place]) [F14.94] 07/06/2015  . Reflux esophagitis [K21.0]   . Diverticulosis of large intestine without hemorrhage [K57.30]   . Hematochezia [K92.1]   . Heme + stool [R19.5] 05/11/2015  . Rectal bleeding [K62.5] 05/11/2015  . Morbid obesity ([place]) [E66.01] 01/24/2015  . Type II diabetes mellitus, uncontrolled ([place]) [E11.65] 01/20/2015  . Essential hypertension, benign [I10] 01/20/2015  . Hyperlipemia [E78.5] 01/20/2015   Past Psychiatric History: Major depression, Cocaine dependence  Past Medical History:  Past Medical History  Diagnosis Date  . Gout   . Diverticula of colon   . Substance abuse     hx etoh, marijuana, cocaine. quit 02/2014  . Diabetes mellitus without complication ([place])     boarderline    Past Surgical History  Procedure Laterality Date  . None to date      As of 05/11/15  . Colonoscopy with propofol N/A 06/04/2015    RMR: internal hemorrhoids/anal papilla  . Esophagogastroduodenoscopy (egd) with propofol N/A 06/04/2015    RMR: mild erosive reflux   Family History:  Family History  Problem Relation Age of Onset  . Colon cancer Neg Hx    Family Psychiatric  History: See H&P  Social History:  History  Alcohol Use No    Comment: hx alcoholism. Now only a drink on special occasions (2-3 times a year)      History  Drug Use  . Yes  . Special: Cocaine, Marijuana    Social History   Social History  . Marital Status: Single    Spouse Name: N/A  . Number of Children: N/A  . Years of Education: N/A   Social History Main Topics  . Smoking status: Never Smoker   . Smokeless tobacco: Never Used  . Alcohol Use: No     Comment: hx alcoholism. Now only a drink on special occasions (2-3 times a year)  . Drug Use: Yes    Special: Cocaine, Marijuana  . Sexual Activity: Not Asked   Other Topics Concern  . None   Social History Narrative   Hospital Course: 45 Y/O male who states a year ago he lost his mother. He got very depressed suicidal. He went to [employer]. Now recently his father was diagnosed with lung cancer. He got very depressed and the depression got worst when his father had a bad reaction to chemotherapy. States he is adopted. He met his biological family but does not know anything about their medical history. He relapsed this weekend on cocaine. States he was trying to kill himself by overdosing on cocaine.  Upon his arrival & admision to the adult unit, Mark Acosta was evaluated & his presenting symptoms identified. He was admitted to the Mark Acosta adult unit for worsening symptoms of depression with suicidal thoughts. After evaluation of his symptoms, it was determine that Mark Acosta will need mood stabilization treatments. The medication regimen for the presenting symptoms were discussed &  initiated targeting those symptoms. He was enrolled in the group counseling sessions & encouraged to participate in the unit programming. His other pre-existing medical problems were identified & his home medications restarted accordingly. Mark Acosta was evaluated on daily basis by the clinical providers to assure his response to the treatment regimen.As his treatment progressed,  improvement was noted as evidenced by his report of decreasing symptoms, improved mood, presentation of good affect, medication  tolerance & active participation in the unit programming. He was encouraged to update his providers on his progress by daily completion of a self inventory, noting mood, mental status, pain, any new symptoms, anxiety and or concerns.   Mark Acosta's symptoms responded well to his treatment regimen combined with a therapeutic and supportive environment. He was motivated for recovery as evidenced by a positive/appropriate behavior and his interaction with the staff & fellow patients.He also worked closely with the treatment team and case manager to develop a discharge plan with appropriate goals to maintain mood stability after discharge. Coping skills, problem solving as well as relaxation therapies were also part of her unit programming.  Upon discharge, Mark Acosta was in much improved condition than upon admission.His symptoms were reported as significantly decreased or resolved completely. He adamantly denies any SIHI,  AVH, delusional thoughts & or paranoia. He was motivated to continue taking medication with a goal of continued improvement in mental health. He will continue psychiatric care, medication management & further substance abuse treatment at the [name]. He is provided with all the necessary information required to make these appointment without problems. He left [facility] with all personal belongings in no apparent distress. Transportation per [employer].See discharge medications below.  Physical Findings: AIMS: Facial and Oral Movements Muscles of Facial Expression: None, normal Lips and Perioral Area: None, normal Jaw: None, normal Tongue: None, normal,Extremity Movements Upper (arms, wrists, hands, fingers): None, normal Lower (legs, knees, ankles, toes): None, normal, Trunk Movements Neck, shoulders, hips: None, normal, Overall Severity Severity of abnormal movements (highest score from questions above): None, normal Incapacitation due to abnormal movements: None, normal Patient's  awareness of abnormal movements (rate only patient's report): No Awareness, Dental Status Current problems with teeth and/or dentures?: No Does patient usually wear dentures?: No  CIWA:  CIWA-Ar Total: 1 COWS:  COWS Total Score: 2  Musculoskeletal: Strength & Muscle Tone: within normal limits Gait & Station: normal Patient leans: N/A  Psychiatric Specialty Exam: Review of Systems  Constitutional: Negative.   HENT: Negative.   Eyes: Negative.   Respiratory: Negative.   Cardiovascular: Negative.   Gastrointestinal: Negative.   Genitourinary: Negative.   Musculoskeletal: Negative.   Skin: Negative.   Neurological: Negative.   Endo/Heme/Allergies: Negative.   Psychiatric/Behavioral: Positive for depression (Stable) and substance abuse (Hx, cocaine dependence). Negative for suicidal ideas, hallucinations and memory loss. The patient has insomnia (Stable). The patient is not nervous/anxious.     Blood pressure 113/68, pulse 80, temperature 97.7 F (36.5 C), temperature source Oral, resp. rate 16, height 5\' 6"  (1.676 m), weight 127.007 kg (280 lb), SpO2 98 %.Body mass index is 45.21 kg/(m^2).  See Md's SRA  Have you used any form of tobacco in the last 30 days? (Cigarettes, Smokeless Tobacco, Cigars, and/or Pipes): No  Has this patient used any form of tobacco in the last 30 days? (Cigarettes, Smokeless Tobacco, Cigars, and/or Pipes):  No  Blood Alcohol level:  Lab Results  Component Value Date   ETH <5 07/06/2015   Metabolic Disorder Labs:  Lab Results  Component Value  Date   HGBA1C 7.2* 07/08/2015   MPG 160 07/08/2015   MPG 154* 01/20/2015   Lab Results  Component Value Date   PROLACTIN 17.0* 07/16/2015   Lab Results  Component Value Date   CHOL 181 07/08/2015   TRIG 203* 07/08/2015   HDL 41 07/08/2015   CHOLHDL 4.4 07/08/2015   VLDL 41* 07/08/2015   LDLCALC 99 07/08/2015   LDLCALC 51 01/20/2015   See Psychiatric Specialty Exam and Suicide Risk Assessment  completed by Attending Physician prior to discharge.  Discharge destination:  [name]  Is patient on multiple antipsychotic therapies at discharge:  No   Has Patient had three or more failed trials of antipsychotic monotherapy by history:  No  Recommended Plan for Multiple Antipsychotic Therapies: NA    Medication List    STOP taking these medications        acetaminophen 500 MG tablet  Commonly known as:  TYLENOL     ibuprofen 600 MG tablet  Commonly known as:  ADVIL,MOTRIN     lovastatin 20 MG tablet  Commonly known as:  MEVACOR      TAKE these medications      Indication   docusate sodium 100 MG capsule  Commonly known as:  COLACE  Take 1 capsule (100 mg total) by mouth daily. (May purchase from over the counter): For constipation   Indication:  Constipation     escitalopram 5 MG tablet  Commonly known as:  LEXAPRO  Take 3 tablets (15 mg total) by mouth daily. For depressiom   Indication:  Major Depressive Disorder     gabapentin 300 MG capsule  Commonly known as:  NEURONTIN  Take 1 capsule (300 mg total) by mouth at bedtime. For agitation   Indication:  Agitation     hydrOXYzine 50 MG tablet  Commonly known as:  ATARAX/VISTARIL  Take 1 tablet (50 mg total) by mouth every 6 (six) hours as needed for anxiety.   Indication:  Anxiety     lamoTRIgine 25 MG tablet  Commonly known as:  LAMICTAL  Take 1 tablet (25 mg total) by mouth 2 (two) times daily. For mood stabilization   Indication:  Mood stabilization     lisinopril 20 MG tablet  Commonly known as:  PRINIVIL,ZESTRIL  Take 1 tablet (20 mg total) by mouth daily. For high blood pressure   Indication:  High Blood Pressure     metFORMIN 500 MG 24 hr tablet  Commonly known as:  GLUCOPHAGE-XR  Take 1 tablet (500 mg total) by mouth daily. For diabetes   Indication:  Type 2 Diabetes     metoprolol 50 MG tablet  Commonly known as:  LOPRESSOR  Take 1 tablet (50 mg total) by mouth 2 (two) times daily. For high  blood pressure   Indication:  High Blood Pressure     pravastatin 20 MG tablet  Commonly known as:  PRAVACHOL  Take 1 tablet (20 mg total) by mouth daily at 6 PM. For high cholesterol   Indication:  Inherited Heterozygous Hypercholesterolemia, cardo     prazosin 2 MG capsule  Commonly known as:  MINIPRESS  Take 1 capsule (2 mg total) by mouth at bedtime. For nightmare   Indication:  Nightmares     testosterone cypionate 200 MG/ML injection  Commonly known as:  DEPOTESTOSTERONE CYPIONATE  Inject 1 mL (200 mg total) into the muscle every 14 (fourteen) days. For hormonal replacement   Indication:  Deficient Activity of Testis or Ovary  traZODone 100 MG tablet  Commonly known as:  DESYREL  Take 2 tablets (200 mg total) by mouth at bedtime. For insomnia   Indication:  Trouble Sleeping       Follow-up Information    Follow up with [facility].   Why:  Referral faxed: 07/20/15. Please follow-up with facility after discharge if you are still interested in attending in order to schedule screening.    Contact information:   [address] [place], [place] [address] Phone: [phone] Fax: [phone]      Follow up with [name] On 07/22/2015.   Why:  You have been accepted for admission on this date. [facility] will transport you to facility between 8am-9am on Wed.    Contact information:   [address] [place], [place] [address] Phone: [phone] Fax: [phone]     Follow-up recommendations: Activity:  As tolerated Diet: As recommended by your primary care doctor. Keep all scheduled follow-up appointments as recommended.  Comments: Take all your medications as prescribed by your mental healthcare provider. Report any adverse effects and or reactions from your medicines to your outpatient provider promptly. Patient is instructed and cautioned to not engage in alcohol and or illegal drug use while on prescription medicines. In the event of worsening  symptoms, patient is instructed to call the crisis hotline, 911 and or go to the nearest ED for appropriate evaluation and treatment of symptoms. Follow-up with your primary care provider for your other medical issues, concerns and or health care needs.   Signed: Encarnacion Slates, NP, PMHNP-BC 07/21/2015, 4:31 PM   I personally assessed the patient and formulated the plan Geralyn Flash A. Sabra Heck, M.D.

## 2015-07-21 NOTE — Progress Notes (Signed)
  [facility]  Will you be returning to the same living situation after discharge:  No. Pt accepted to [name]  At discharge, do you have transportation home?: Yes,  [place] [facility] de[employer] will pick up pt on Wed between 8am-9am Do you have the ability to pay for your medications: Yes,  mental health  Release of information consent forms completed and submitted to medical records by CSW.  Patient to Follow up at: Follow-up Information    [facility].   Why:  Referral faxed: 07/20/15. Please follow-up with facility after discharge if you are still interested in attending in order to schedule screening.    Contact information:   [address] [place], [place] [address] Phone: [phone] Fax: [phone]      Follow up with [name] On 07/22/2015.   Why:  You have been accepted for admission on this date. [place] [facility] will transport you to facility between 8am-9am on Wed.    Contact information:   [address]  [place], [place] [address] Phone: [phone] Fax: [phone]      Next level of care provider [facility] Link:no  Safety Planning and Suicide Prevention discussed: Yes,  SPE completed with pt, as he declined to consent to family contact.  Have you used any form of tobacco in the last 30 days? (Cigarettes, Smokeless Tobacco, Cigars, and/or Pipes): No  Has patient been referred to the Quitline?: N/A patient is not a smoker  Patient has been referred for addiction treatment: Yes  Smart, Mark Barga LCSW 07/21/2015, 2:51 PM

## 2015-07-22 LAB — GLUCOSE, CAPILLARY: Glucose-Capillary: 170 mg/dL — ABNORMAL HIGH (ref 65–99)

## 2015-07-22 NOTE — Progress Notes (Signed)
Pt attended group 

## 2015-07-22 NOTE — Progress Notes (Signed)
D: Patient discharged per MD order.  Patient is being transported to [facility] via sheriff[facility] department.  Patient transported under involuntary commitment.  Patient states, "I don't what I'm going to do or go.  It's all in God's hands.  It will work out."  Patient was Adult nurseappreciative of staff's assistance.  Reviewed AVS/transition report with patient and he indicated understanding.  He received all personal belongings from locker and unit.  Patient received duffle bag that was stored behind curtain.  Patient denies SI/HI/AVH.  Patient left ambulatory with deputy.

## 2015-08-19 ENCOUNTER — Encounter: Payer: Self-pay | Admitting: Internal Medicine

## 2015-10-06 IMAGING — CT CT ABD-PELV W/ CM
2 of 5 series · 17 of 46 positions shown, 19 images · IV contrast (Omnipaque 300)
Comparison: None.

CLINICAL DATA: Left lower quadrant and rectal pain with blood in
stool

EXAM:
CT ABDOMEN AND PELVIS WITH CONTRAST
TECHNIQUE: Multidetector CT imaging of the abdomen and pelvis was performed
using the standard protocol following bolus administration of
intravenous contrast. Oral contrast was also administered.
CONTRAST:  25mL OMNIPAQUE IOHEXOL 300 MG/ML SOLN, 100mL OMNIPAQUE
IOHEXOL 300 MG/ML SOLN

[Series 2: abd_pel_with 5.0 b40f · axial · 0.94mm/px · z∈[-460,-25]mm · 14 of 99 slices shown, 16 images]
[im 6/99  soft-tissue]
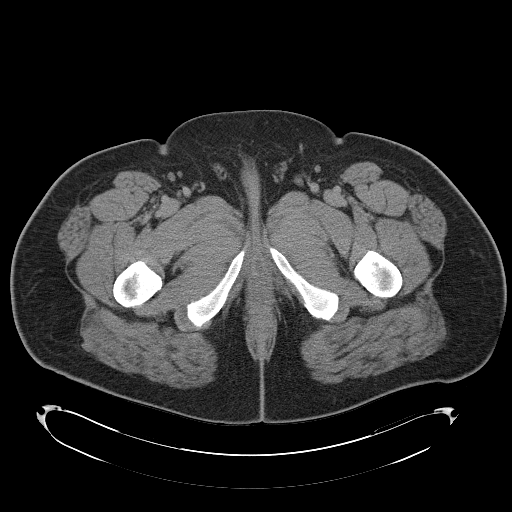
[im 6/99  bone]
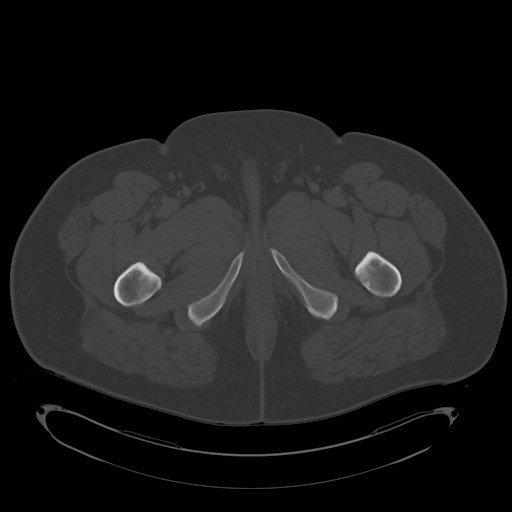
[im 12/99  soft-tissue]
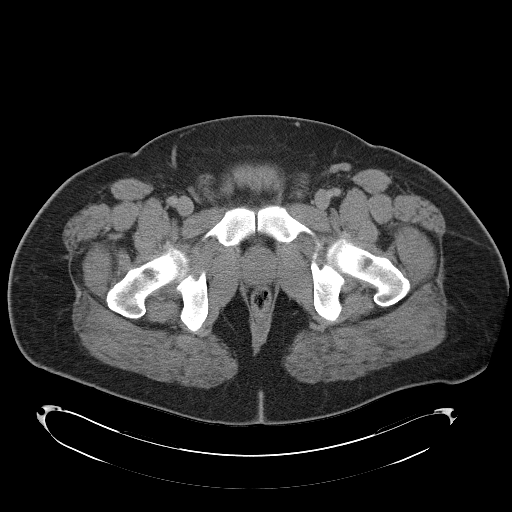
[im 18/99  soft-tissue]
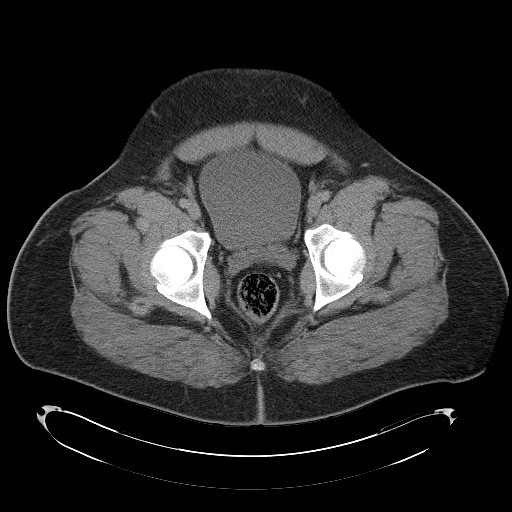
[im 29/99  soft-tissue]
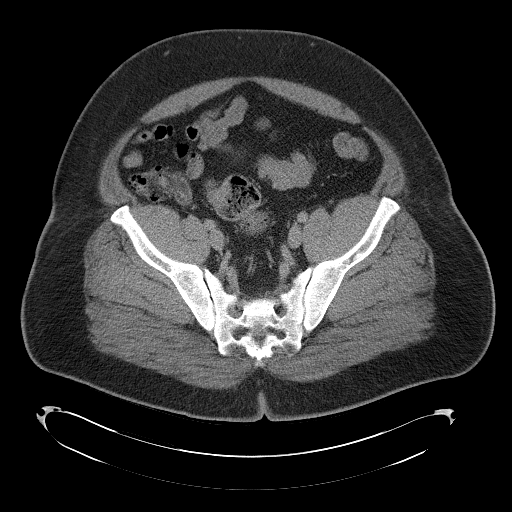
[im 35/99  soft-tissue]
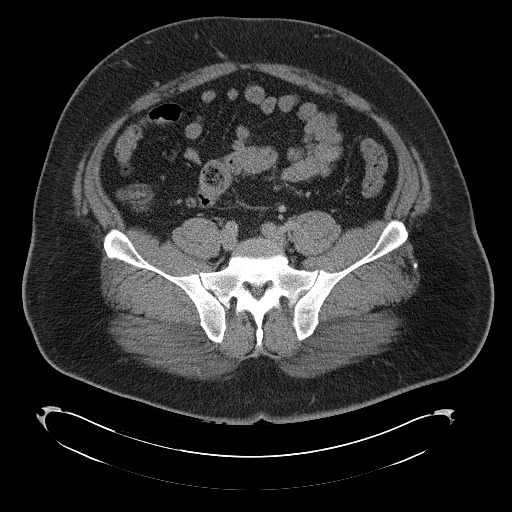
[im 41/99  soft-tissue]
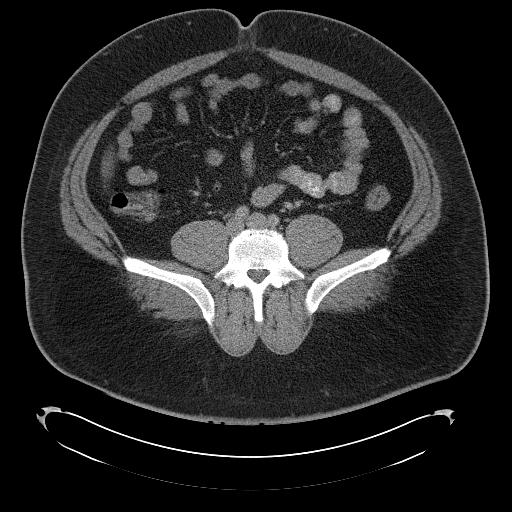
[im 47/99  soft-tissue]
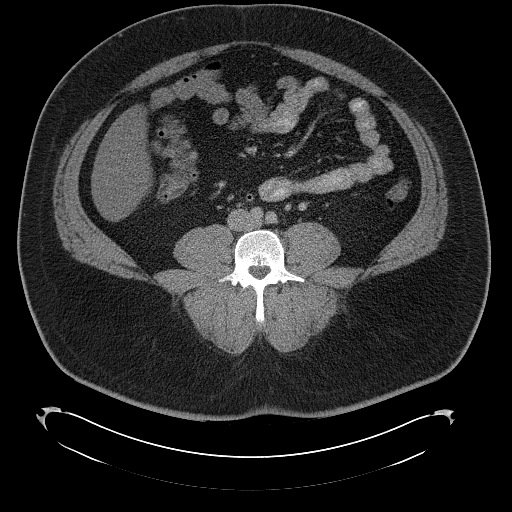
[im 52/99  soft-tissue]
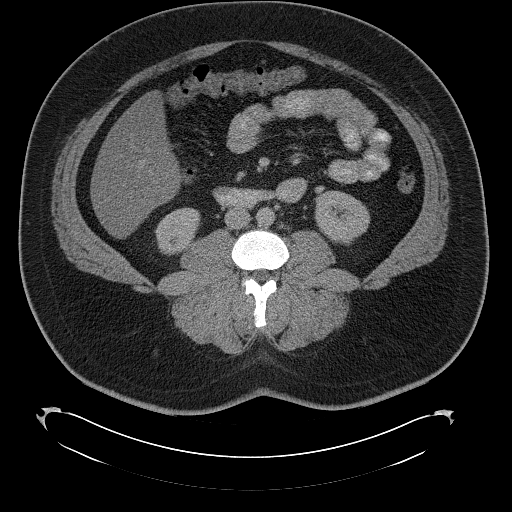
[im 58/99  soft-tissue]
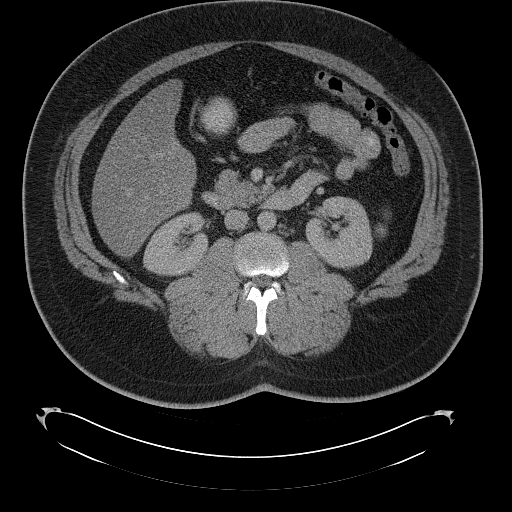
[im 58/99  bone]
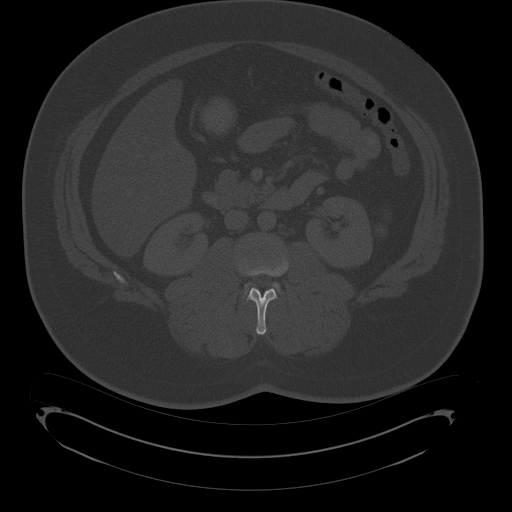
[im 64/99  soft-tissue]
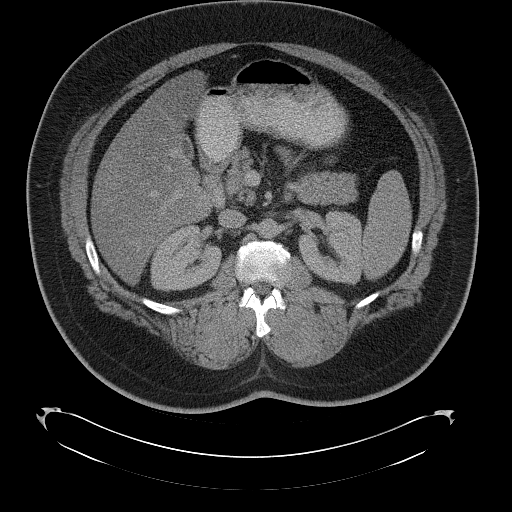
[im 75/99  soft-tissue]
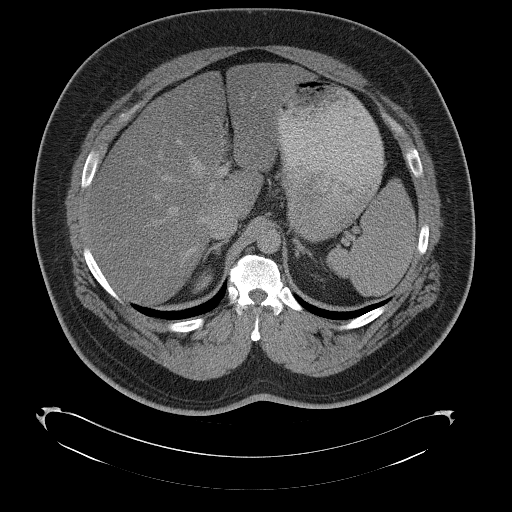
[im 81/99  soft-tissue]
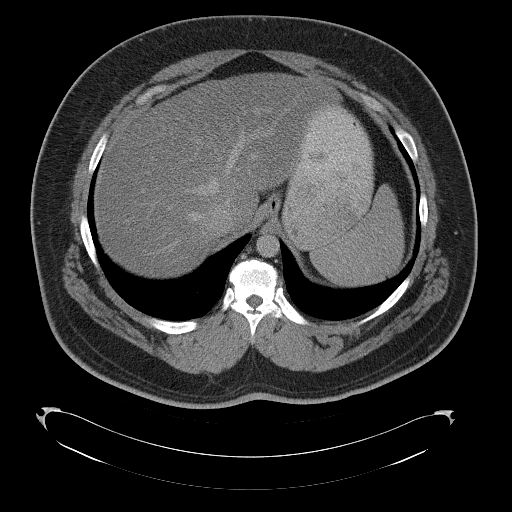
[im 87/99  soft-tissue]
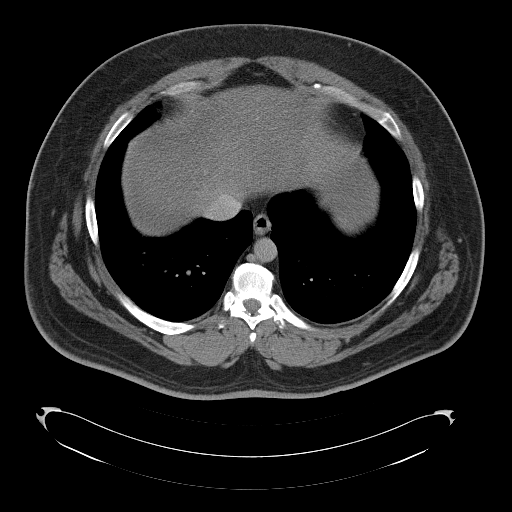
[im 93/99  soft-tissue]
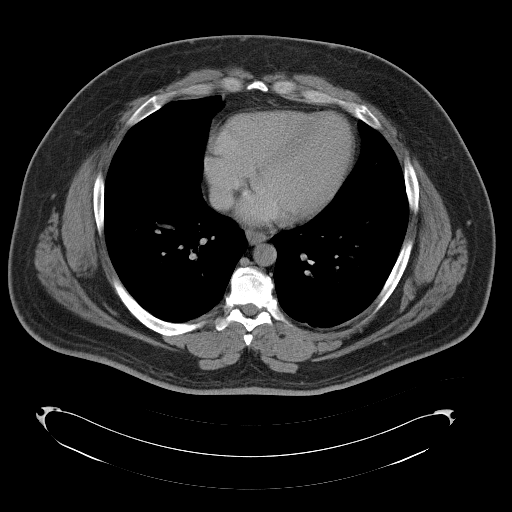

[Series 4: abd_pel_with 3.0 spo cor · coronal · 0.96mm/px · 3 of 114 slices shown]
[im 38/114  soft-tissue]
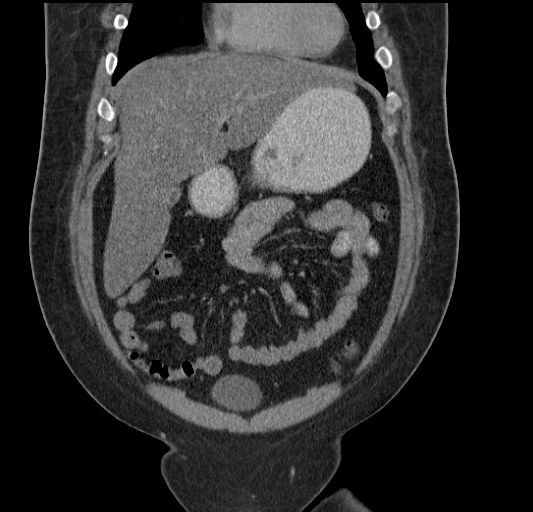
[im 51/114  soft-tissue]
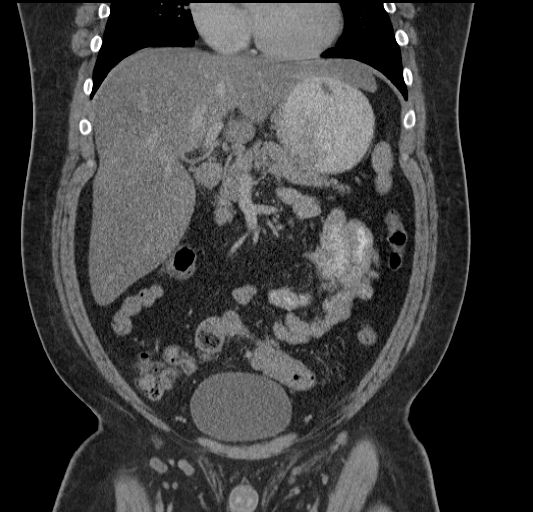
[im 63/114  soft-tissue]
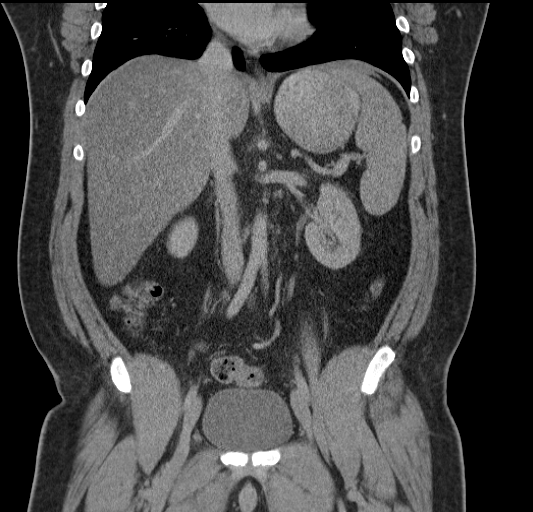

[17 of 46 positions shown; findings below may reference images not displayed]

FINDINGS: Lung bases are clear.

Liver is enlarged, measuring 24.9 cm in length. There is hepatic
steatosis. No focal liver lesions are identified. There is no
biliary duct dilatation. Gallbladder appears mildly contracted.

Spleen, pancreas, and adrenals appear normal. There is a 1.4 x
cm cyst in the lower pole the right kidney. There is no demonstrable
hydronephrosis on either side. There is no renal or ureteral
calculus on either side.

In the pelvis, the urinary bladder is midline with normal wall
thickness. There is no pelvic mass or pelvic fluid collection. There
is no perirectal thickening. Appendix appears normal.

There is no bowel obstruction.  No free air or portal venous air.

There is no appreciable ascites, adenopathy, or abscess in the
abdomen or pelvis. There is no abdominal aortic aneurysm. There is
no blastic or lytic bone lesion.
IMPRESSION: Liver is enlarged with hepatic steatosis.

No bowel obstruction.  No abscess.  Appendix appears normal.

No renal or ureteral calculus.  No hydronephrosis.

No perirectal thickening or stranding.

## 2019-01-29 ENCOUNTER — Other Ambulatory Visit: Payer: Self-pay | Admitting: *Deleted

## 2019-01-29 DIAGNOSIS — Z20822 Contact with and (suspected) exposure to covid-19: Secondary | ICD-10-CM

## 2019-01-30 LAB — NOVEL CORONAVIRUS, NAA: SARS-CoV-2, NAA: DETECTED — AB

## 2019-02-01 ENCOUNTER — Telehealth: Payer: Self-pay | Admitting: *Deleted

## 2019-02-01 NOTE — Telephone Encounter (Signed)
Copied from [facility] [place] [phone]. Topic: Quick Communication - Lab Results (Clinic Use ONLY) >> Feb 01, 2019  9:37 AM Robina Ade, Helene Kelp D wrote: Patient is calling to get his covid test results. Please call patient back , thanks.

## 2022-11-24 ENCOUNTER — Other Ambulatory Visit: Payer: Self-pay

## 2022-11-24 ENCOUNTER — Emergency Department (HOSPITAL_COMMUNITY): Payer: 59

## 2022-11-24 ENCOUNTER — Observation Stay (HOSPITAL_COMMUNITY)
Admission: EM | Admit: 2022-11-24 | Discharge: 2022-11-26 | Disposition: A | Discharge status: Home / Self Care | Payer: 59 | Attending: Internal Medicine | Admitting: Internal Medicine

## 2022-11-24 ENCOUNTER — Encounter (HOSPITAL_COMMUNITY): Payer: Self-pay

## 2022-11-24 DIAGNOSIS — F1910 Other psychoactive substance abuse, uncomplicated: Secondary | ICD-10-CM | POA: Insufficient documentation

## 2022-11-24 DIAGNOSIS — F339 Major depressive disorder, recurrent, unspecified: Secondary | ICD-10-CM | POA: Insufficient documentation

## 2022-11-24 DIAGNOSIS — Z6836 Body mass index (BMI) 36.0-36.9, adult: Secondary | ICD-10-CM | POA: Insufficient documentation

## 2022-11-24 DIAGNOSIS — M109 Gout, unspecified: Secondary | ICD-10-CM | POA: Diagnosis not present

## 2022-11-24 DIAGNOSIS — E1165 Type 2 diabetes mellitus with hyperglycemia: Secondary | ICD-10-CM | POA: Diagnosis not present

## 2022-11-24 DIAGNOSIS — E782 Mixed hyperlipidemia: Secondary | ICD-10-CM | POA: Diagnosis not present

## 2022-11-24 DIAGNOSIS — F1290 Cannabis use, unspecified, uncomplicated: Secondary | ICD-10-CM | POA: Insufficient documentation

## 2022-11-24 DIAGNOSIS — E1100 Type 2 diabetes mellitus with hyperosmolarity without nonketotic hyperglycemic-hyperosmolar coma (NKHHC): Secondary | ICD-10-CM

## 2022-11-24 DIAGNOSIS — E669 Obesity, unspecified: Secondary | ICD-10-CM | POA: Diagnosis not present

## 2022-11-24 DIAGNOSIS — B370 Candidal stomatitis: Secondary | ICD-10-CM | POA: Insufficient documentation

## 2022-11-24 DIAGNOSIS — R369 Urethral discharge, unspecified: Secondary | ICD-10-CM | POA: Insufficient documentation

## 2022-11-24 DIAGNOSIS — I10 Essential (primary) hypertension: Secondary | ICD-10-CM | POA: Diagnosis not present

## 2022-11-24 DIAGNOSIS — E871 Hypo-osmolality and hyponatremia: Secondary | ICD-10-CM | POA: Diagnosis not present

## 2022-11-24 DIAGNOSIS — R1032 Left lower quadrant pain: Secondary | ICD-10-CM | POA: Diagnosis not present

## 2022-11-24 DIAGNOSIS — R739 Hyperglycemia, unspecified: Principal | ICD-10-CM

## 2022-11-24 DIAGNOSIS — E872 Acidosis, unspecified: Secondary | ICD-10-CM | POA: Insufficient documentation

## 2022-11-24 DIAGNOSIS — F1911 Other psychoactive substance abuse, in remission: Secondary | ICD-10-CM | POA: Insufficient documentation

## 2022-11-24 DIAGNOSIS — R059 Cough, unspecified: Secondary | ICD-10-CM | POA: Diagnosis not present

## 2022-11-24 DIAGNOSIS — R000 Tachycardia, unspecified: Secondary | ICD-10-CM | POA: Diagnosis not present

## 2022-11-24 DIAGNOSIS — F332 Major depressive disorder, recurrent severe without psychotic features: Secondary | ICD-10-CM | POA: Diagnosis present

## 2022-11-24 DIAGNOSIS — R7989 Other specified abnormal findings of blood chemistry: Secondary | ICD-10-CM | POA: Insufficient documentation

## 2022-11-24 LAB — COMPREHENSIVE METABOLIC PANEL
ALT: 30 U/L (ref 0–44)
AST: 22 U/L (ref 15–41)
Albumin: 4.1 g/dL (ref 3.5–5.0)
Alkaline Phosphatase: 113 U/L (ref 38–126)
Anion gap: 11 (ref 5–15)
BUN: 14 mg/dL (ref 6–20)
CO2: 27 mmol/L (ref 22–32)
Calcium: 9.1 mg/dL (ref 8.9–10.3)
Chloride: 89 mmol/L — ABNORMAL LOW (ref 98–111)
Creatinine, Ser: 0.62 mg/dL (ref 0.61–1.24)
GFR, Estimated: >60 mL/min (ref >60)
Glucose, Bld: 499 mg/dL — ABNORMAL HIGH (ref 70–99)
Potassium: 3.7 mmol/L (ref 3.5–5.1)
Sodium: 127 mmol/L — ABNORMAL LOW (ref 135–145)
Total Bilirubin: 0.5 mg/dL (ref 0.3–1.2)
Total Protein: 7.7 g/dL (ref 6.5–8.1)

## 2022-11-24 LAB — CBC
HCT: 45 % (ref 39.0–52.0)
Hemoglobin: 15.9 g/dL (ref 13.0–17.0)
MCH: 29.2 pg (ref 26.0–34.0)
MCHC: 35.3 g/dL (ref 30.0–36.0)
MCV: 82.6 fL (ref 80.0–100.0)
Platelets: 282 10*3/uL (ref 150–400)
RBC: 5.45 MIL/uL (ref 4.22–5.81)
RDW: 12.4 % (ref 11.5–15.5)
WBC: 9.5 10*3/uL (ref 4.0–10.5)
nRBC: 0 % (ref 0.0–0.2)

## 2022-11-24 LAB — URINALYSIS, ROUTINE W REFLEX MICROSCOPIC
Bacteria, UA: NONE SEEN
Bilirubin Urine: NEGATIVE
Glucose, UA: >=500 mg/dL — AB
Hgb urine dipstick: NEGATIVE
Ketones, ur: NEGATIVE mg/dL
Leukocytes,Ua: NEGATIVE
Nitrite: NEGATIVE
Protein, ur: NEGATIVE mg/dL
Specific Gravity, Urine: 1.03 (ref 1.005–1.030)
pH: 6 (ref 5.0–8.0)

## 2022-11-24 LAB — BLOOD GAS, VENOUS
Acid-Base Excess: 5.8 mmol/L — ABNORMAL HIGH (ref 0.0–2.0)
Bicarbonate: 31.2 mmol/L — ABNORMAL HIGH (ref 20.0–28.0)
Drawn by: 96862
O2 Saturation: 95.3 %
Patient temperature: 36.8
pCO2, Ven: 47 mmHg (ref 44–60)
pH, Ven: 7.43 (ref 7.25–7.43)
pO2, Ven: 67 mmHg — ABNORMAL HIGH (ref 32–45)

## 2022-11-24 LAB — CBG MONITORING, ED
Glucose-Capillary: 341 mg/dL — ABNORMAL HIGH (ref 70–99)
Glucose-Capillary: 484 mg/dL — ABNORMAL HIGH (ref 70–99)

## 2022-11-24 LAB — BETA-HYDROXYBUTYRIC ACID: Beta-Hydroxybutyric Acid: 0.1 mmol/L (ref 0.05–0.27)

## 2022-11-24 LAB — LACTIC ACID, PLASMA
Lactic Acid, Venous: 2.3 mmol/L (ref 0.5–1.9)
Lactic Acid, Venous: 1.6 mmol/L (ref 0.5–1.9)

## 2022-11-24 MED ORDER — IOHEXOL 300 MG/ML  SOLN
100.0000 mL | Freq: Once | INTRAMUSCULAR | Status: AC | PRN
  Administered 2022-11-24: 100 mL via INTRAVENOUS

## 2022-11-24 MED ORDER — SODIUM CHLORIDE 0.9 % IV BOLUS
1000.0000 mL | Freq: Once | INTRAVENOUS | Status: AC
  Administered 2022-11-24: 1000 mL via INTRAVENOUS

## 2022-11-24 NOTE — ED Provider Notes (Signed)
[place] EMERGENCY DEPARTMENT AT [facility] Provider Note   CSN: [id number] Arrival date & time: 11/24/22  1931     History  Chief Complaint  Patient presents with   Hyperglycemia    Mark Acosta is a 52 y.o. male.   Hyperglycemia Patient presents with hyperglycemia.  Reportedly found to have sugar of 550.  Previous history of diabetes but reportedly A1c was normal so he was taken off his metformin.  That was a few years ago.  However has had urinary frequency.  Difficulty eating due to mouth pain.  Also flank pain dysuria.  States he had several small amount of white discharge on his penis that he thinks was yeast.  States he was treated with these medication.  Also severe abdominal pain on the left.  States he cannot really eat because of the mild problems.  Has lost some weight.  Also left side abdominal pain like when he had diverticulitis.       Home Medications Prior to Admission medications   Medication Sig Start Date End Date Taking? Authorizing Provider  oxyCODONE-acetaminophen (PERCOCET) 10-325 MG tablet Take 1 tablet by mouth every 4 (four) hours as needed for pain.   Yes [provider]      Allergies    Patient has no known allergies.    Review of Systems   Review of Systems  Physical Exam Updated Vital Signs BP (!) 150/95 (BP Location: Left Arm)   Pulse (!) 106   Temp 98.1 F (36.7 C) (Oral)   Resp 18   Ht 5' 5.98" (1.676 m)   Wt 101.8 kg   SpO2 100%   BMI 36.24 kg/m  Physical Exam HENT:     Head: Atraumatic.     Mouth/Throat:     Comments: Likely oral thrush Eyes:     Extraocular Movements: Extraocular movements intact.  Cardiovascular:     Rate and Rhythm: Tachycardia present.  Abdominal:     Tenderness: There is abdominal tenderness.     Comments: Tenderness to left upper and mid abdomen.  Genitourinary:    Comments: Small area of erythema on dorsum of penis.  No discharge. Skin:    General: Skin is warm.      Capillary Refill: Capillary refill takes less than 2 seconds.  Neurological:     Mental Status: He is alert and oriented to person, place, and time.     ED Results / Procedures / Treatments   Labs (all labs ordered are listed, but only abnormal results are displayed) Labs Reviewed  URINALYSIS, ROUTINE W REFLEX MICROSCOPIC - Abnormal; Notable for the following components:      Result Value   Color, Urine STRAW (*)    Glucose, UA >=500 (*)    All other components within normal limits  COMPREHENSIVE METABOLIC PANEL - Abnormal; Notable for the following components:   Sodium 127 (*)    Chloride 89 (*)    Glucose, Bld 499 (*)    All other components within normal limits  LACTIC ACID, PLASMA - Abnormal; Notable for the following components:   Lactic Acid, Venous 2.3 (*)    All other components within normal limits  HEMOGLOBIN A1C - Abnormal; Notable for the following components:   Hgb A1c MFr Bld 14.4 (*)    All other components within normal limits  BLOOD GAS, VENOUS - Abnormal; Notable for the following components:   pO2, Ven 67 (*)    Bicarbonate 31.2 (*)    Acid-Base Excess  5.8 (*)    All other components within normal limits  COMPREHENSIVE METABOLIC PANEL - Abnormal; Notable for the following components:   Sodium 131 (*)    Chloride 96 (*)    Glucose, Bld 320 (*)    Creatinine, Ser 0.57 (*)    Calcium 8.4 (*)    All other components within normal limits  GLUCOSE, CAPILLARY - Abnormal; Notable for the following components:   Glucose-Capillary 345 (*)    All other components within normal limits  RAPID URINE DRUG SCREEN, HOSP PERFORMED - Abnormal; Notable for the following components:   Amphetamines POSITIVE (*)    All other components within normal limits  GLUCOSE, CAPILLARY - Abnormal; Notable for the following components:   Glucose-Capillary 338 (*)    All other components within normal limits  GLUCOSE, CAPILLARY - Abnormal; Notable for the following components:    Glucose-Capillary 353 (*)    All other components within normal limits  CBG MONITORING, ED - Abnormal; Notable for the following components:   Glucose-Capillary 484 (*)    All other components within normal limits  CBG MONITORING, ED - Abnormal; Notable for the following components:   Glucose-Capillary 341 (*)    All other components within normal limits  CULTURE, BLOOD (ROUTINE X 2)  CULTURE, BLOOD (ROUTINE X 2)  CBC  LACTIC ACID, PLASMA  BETA-HYDROXYBUTYRIC ACID  HIV ANTIBODY (ROUTINE TESTING W REFLEX)  CBC  MAGNESIUM  PHOSPHORUS  RPR  CBG MONITORING, ED  GC/CHLAMYDIA PROBE AMP ([place]) NOT AT [facility]    EKG None  Radiology CT ABDOMEN PELVIS W CONTRAST  Result Date: 11/24/2022 CLINICAL DATA:  Left lower quadrant abdominal pain. EXAM: CT ABDOMEN AND PELVIS WITH CONTRAST TECHNIQUE: Multidetector CT imaging of the abdomen and pelvis was performed using the standard protocol following bolus administration of intravenous contrast. RADIATION DOSE REDUCTION: This exam was performed according to the departmental dose-optimization program which includes automated exposure control, adjustment of the mA and/or kV according to patient size and/or use of iterative reconstruction technique. CONTRAST:  OMNIPAQUE IOHEXOL 300 MG/ML  SOLN COMPARISON:  CT abdomen and pelvis 09/02/2014 FINDINGS: Lower chest: No acute abnormality. Hepatobiliary: Unremarkable liver. Normal gallbladder. No biliary dilation. Pancreas: Unremarkable. Spleen: Unremarkable. Adrenals/Urinary Tract: Normal adrenal glands. No urinary calculi or hydronephrosis. Bladder is unremarkable. Stomach/Bowel: Normal caliber large and small bowel. No bowel wall thickening. The appendix is normal.Stomach is within normal limits. Vascular/Lymphatic: No significant vascular findings are present. No enlarged abdominal or pelvic lymph nodes. Reproductive: Unremarkable. Other: No free intraperitoneal fluid or air. Musculoskeletal: No acute  fracture. IMPRESSION: No acute abnormality in the abdomen or pelvis. Electronically Signed   By: Minerva Fester M.D.   On: 11/24/2022 23:36   DG Chest Portable 1 View  Result Date: 11/24/2022 CLINICAL DATA:  Hyperglycemia and cough EXAM: PORTABLE CHEST 1 VIEW COMPARISON:  Radiograph report from 03/01/2014 FINDINGS: The heart size and mediastinal contours are within normal limits. Low lung volumes accentuate pulmonary vascularity. Both lungs are clear. The visualized skeletal structures are unremarkable. IMPRESSION: No active disease. Electronically Signed   By: Minerva Fester M.D.   On: 11/24/2022 21:34    Procedures Procedures    Medications Ordered in ED Medications  insulin aspart (novoLOG) injection 0-15 Units (15 Units Subcutaneous Given 11/25/22 1202)  enoxaparin (LOVENOX) injection 40 mg (40 mg Subcutaneous Given 11/25/22 0814)  acetaminophen (TYLENOL) tablet 650 mg (650 mg Oral Given 11/25/22 0815)    Or  acetaminophen (TYLENOL) suppository 650 mg ( Rectal  See Alternative 11/25/22 0815)  ondansetron (ZOFRAN) tablet 4 mg (has no administration in time range)    Or  ondansetron (ZOFRAN) injection 4 mg (has no administration in time range)  insulin glargine-yfgn (SEMGLEE) injection 15 Units (15 Units Subcutaneous Given 11/25/22 0944)  0.9 % NaCl with KCl 20 mEq/ L  infusion ( Intravenous New Bag/Given 11/25/22 0820)  magic mouthwash (has no administration in time range)  sodium chloride 0.9 % bolus 1,000 mL (0 mLs Intravenous Stopped 11/24/22 2241)  iohexol (OMNIPAQUE) 300 MG/ML solution 100 mL (100 mLs Intravenous Contrast Given 11/24/22 2310)  sodium chloride 0.9 % bolus 1,000 mL (1,000 mLs Intravenous New Bag/Given 11/24/22 2355)  insulin aspart (novoLOG) injection 6 Units (6 Units Intravenous Given 11/25/22 0024)  insulin aspart (novoLOG) injection 4 Units (4 Units Subcutaneous Given 11/25/22 0434)  living well with diabetes book MISC ( Does not apply Given 11/25/22 1231)  insulin starter  kit- pen needles (English) 1 kit (1 kit Other Given 11/25/22 1231)    ED Course/ Medical Decision Making/ A&P                                 Medical Decision Making Amount and/or Complexity of Data Reviewed Labs: ordered. Radiology: ordered.  Risk Prescription drug management. Decision regarding hospitalization.   Patient with hyperglycemia.  History of diabetes but has not been on medicines for years now.  Tachycardic.  Appears somewhat dehydrated.  Mouth is dry but does have potential thrush.  Abdominal tenderness.  Sugar is elevated at 550 initially has come down to 300.  Blood work is better than I expected with more likely abnormality from contraction/dehydration.  Normal beta hydroxybutyrate.  However still having difficulty eating.  Has abdominal pain.  Has thrush.  Not on medicines I feel he would benefit from admission to the hospital for further adjustment of the sugar and being started back on medications.  Will discuss with hospitalist.         Final Clinical Impression(s) / ED Diagnoses Final diagnoses:  Hyperglycemia  Oral thrush    Rx / DC Orders ED Discharge Orders     None         Benjiman Core, MD 11/25/22 1446

## 2022-11-24 NOTE — ED Triage Notes (Signed)
Pt reports he failed a UDS with his parole office today and was sent to rehab and they discovered his FSBS was 550 and told him to come to the ER.  Pt said he is not a diabetic anymore and his doctor took him off his metfomin.

## 2022-11-25 ENCOUNTER — Encounter (HOSPITAL_COMMUNITY): Payer: Self-pay | Admitting: Internal Medicine

## 2022-11-25 ENCOUNTER — Other Ambulatory Visit (HOSPITAL_COMMUNITY): Payer: Self-pay

## 2022-11-25 DIAGNOSIS — I10 Essential (primary) hypertension: Secondary | ICD-10-CM | POA: Diagnosis not present

## 2022-11-25 DIAGNOSIS — E1100 Type 2 diabetes mellitus with hyperosmolarity without nonketotic hyperglycemic-hyperosmolar coma (NKHHC): Secondary | ICD-10-CM | POA: Insufficient documentation

## 2022-11-25 DIAGNOSIS — F1991 Other psychoactive substance use, unspecified, in remission: Secondary | ICD-10-CM | POA: Diagnosis not present

## 2022-11-25 DIAGNOSIS — E782 Mixed hyperlipidemia: Secondary | ICD-10-CM | POA: Diagnosis not present

## 2022-11-25 DIAGNOSIS — R7989 Other specified abnormal findings of blood chemistry: Secondary | ICD-10-CM | POA: Diagnosis not present

## 2022-11-25 DIAGNOSIS — E1165 Type 2 diabetes mellitus with hyperglycemia: Secondary | ICD-10-CM

## 2022-11-25 DIAGNOSIS — F32A Depression, unspecified: Secondary | ICD-10-CM | POA: Diagnosis not present

## 2022-11-25 DIAGNOSIS — E669 Obesity, unspecified: Secondary | ICD-10-CM | POA: Insufficient documentation

## 2022-11-25 DIAGNOSIS — E872 Acidosis, unspecified: Secondary | ICD-10-CM | POA: Diagnosis not present

## 2022-11-25 DIAGNOSIS — R739 Hyperglycemia, unspecified: Principal | ICD-10-CM | POA: Insufficient documentation

## 2022-11-25 DIAGNOSIS — R369 Urethral discharge, unspecified: Secondary | ICD-10-CM | POA: Diagnosis not present

## 2022-11-25 DIAGNOSIS — B370 Candidal stomatitis: Secondary | ICD-10-CM

## 2022-11-25 DIAGNOSIS — F1911 Other psychoactive substance abuse, in remission: Secondary | ICD-10-CM | POA: Insufficient documentation

## 2022-11-25 LAB — COMPREHENSIVE METABOLIC PANEL
ALT: 26 U/L (ref 0–44)
AST: 17 U/L (ref 15–41)
Albumin: 3.5 g/dL (ref 3.5–5.0)
Alkaline Phosphatase: 101 U/L (ref 38–126)
Anion gap: 9 (ref 5–15)
BUN: 14 mg/dL (ref 6–20)
CO2: 26 mmol/L (ref 22–32)
Calcium: 8.4 mg/dL — ABNORMAL LOW (ref 8.9–10.3)
Chloride: 96 mmol/L — ABNORMAL LOW (ref 98–111)
Creatinine, Ser: 0.57 mg/dL — ABNORMAL LOW (ref 0.61–1.24)
GFR, Estimated: >60 mL/min (ref >60)
Glucose, Bld: 320 mg/dL — ABNORMAL HIGH (ref 70–99)
Potassium: 3.8 mmol/L (ref 3.5–5.1)
Sodium: 131 mmol/L — ABNORMAL LOW (ref 135–145)
Total Bilirubin: 0.4 mg/dL (ref 0.3–1.2)
Total Protein: 6.7 g/dL (ref 6.5–8.1)

## 2022-11-25 LAB — CBC
HCT: 41 % (ref 39.0–52.0)
Hemoglobin: 14.6 g/dL (ref 13.0–17.0)
MCH: 29.3 pg (ref 26.0–34.0)
MCHC: 35.6 g/dL (ref 30.0–36.0)
MCV: 82.3 fL (ref 80.0–100.0)
Platelets: 268 10*3/uL (ref 150–400)
RBC: 4.98 MIL/uL (ref 4.22–5.81)
RDW: 12.3 % (ref 11.5–15.5)
WBC: 8.4 10*3/uL (ref 4.0–10.5)
nRBC: 0 % (ref 0.0–0.2)

## 2022-11-25 LAB — HEMOGLOBIN A1C
Hgb A1c MFr Bld: 14.4 % — ABNORMAL HIGH (ref 4.8–5.6)
Mean Plasma Glucose: 366.58 mg/dL

## 2022-11-25 LAB — GLUCOSE, CAPILLARY
Glucose-Capillary: 345 mg/dL — ABNORMAL HIGH (ref 70–99)
Glucose-Capillary: 338 mg/dL — ABNORMAL HIGH (ref 70–99)
Glucose-Capillary: 353 mg/dL — ABNORMAL HIGH (ref 70–99)
Glucose-Capillary: 244 mg/dL — ABNORMAL HIGH (ref 70–99)
Glucose-Capillary: 331 mg/dL — ABNORMAL HIGH (ref 70–99)

## 2022-11-25 LAB — MAGNESIUM: Magnesium: 2 mg/dL (ref 1.7–2.4)

## 2022-11-25 LAB — RAPID URINE DRUG SCREEN, HOSP PERFORMED
Amphetamines: POSITIVE — AB
Barbiturates: NOT DETECTED
Benzodiazepines: NOT DETECTED
Cocaine: NOT DETECTED
Opiates: NOT DETECTED
Tetrahydrocannabinol: NOT DETECTED

## 2022-11-25 LAB — PHOSPHORUS: Phosphorus: 4.1 mg/dL (ref 2.5–4.6)

## 2022-11-25 LAB — HIV ANTIBODY (ROUTINE TESTING W REFLEX): HIV Screen 4th Generation wRfx: NONREACTIVE

## 2022-11-25 MED ORDER — POTASSIUM CHLORIDE IN NACL 20-0.9 MEQ/L-% IV SOLN: INTRAVENOUS | Status: AC

## 2022-11-25 MED ORDER — INSULIN GLARGINE-YFGN 100 UNIT/ML ~~LOC~~ SOLN
15.0000 [IU] | Freq: Every day | SUBCUTANEOUS | Status: DC
  Administered 2022-11-25 – 2022-11-26 (×2): 15 [IU] via SUBCUTANEOUS
  Filled 2022-11-25 (×3): qty 0.15

## 2022-11-25 MED ORDER — INSULIN GLARGINE-YFGN 100 UNIT/ML ~~LOC~~ SOLN: 10.0000 [IU] | Freq: Every day | SUBCUTANEOUS | Status: DC

## 2022-11-25 MED ORDER — INSULIN ASPART 100 UNIT/ML IJ SOLN
0.0000 [IU] | Freq: Three times a day (TID) | INTRAMUSCULAR | Status: DC
  Administered 2022-11-25: 4 [IU] via SUBCUTANEOUS

## 2022-11-25 MED ORDER — MAGIC MOUTHWASH
10.0000 mL | Freq: Four times a day (QID) | ORAL | Status: DC
  Administered 2022-11-26: 10 mL via ORAL
  Filled 2022-11-25 (×13): qty 10

## 2022-11-25 MED ORDER — ONDANSETRON HCL 4 MG PO TABS: 4.0000 mg | ORAL_TABLET | Freq: Four times a day (QID) | ORAL | Status: DC | PRN

## 2022-11-25 MED ORDER — INSULIN ASPART 100 UNIT/ML IJ SOLN: 0.0000 [IU] | Freq: Every day | INTRAMUSCULAR | Status: DC

## 2022-11-25 MED ORDER — INSULIN STARTER KIT- PEN NEEDLES (ENGLISH)
1.0000 | Freq: Once | Status: AC
  Administered 2022-11-25: 1
  Filled 2022-11-25: qty 1

## 2022-11-25 MED ORDER — ONDANSETRON HCL 4 MG/2ML IJ SOLN: 4.0000 mg | Freq: Four times a day (QID) | INTRAMUSCULAR | Status: DC | PRN

## 2022-11-25 MED ORDER — INSULIN ASPART 100 UNIT/ML IJ SOLN
6.0000 [IU] | Freq: Once | INTRAMUSCULAR | Status: AC
  Administered 2022-11-25: 6 [IU] via INTRAVENOUS
  Filled 2022-11-25: qty 1

## 2022-11-25 MED ORDER — INSULIN ASPART 100 UNIT/ML IJ SOLN
4.0000 [IU] | Freq: Once | INTRAMUSCULAR | Status: AC
  Administered 2022-11-25: 4 [IU] via SUBCUTANEOUS

## 2022-11-25 MED ORDER — ACETAMINOPHEN 325 MG PO TABS
650.0000 mg | ORAL_TABLET | Freq: Four times a day (QID) | ORAL | Status: DC | PRN
  Administered 2022-11-25 – 2022-11-26 (×4): 650 mg via ORAL
  Filled 2022-11-25 (×4): qty 2

## 2022-11-25 MED ORDER — INSULIN ASPART 100 UNIT/ML IJ SOLN
0.0000 [IU] | Freq: Three times a day (TID) | INTRAMUSCULAR | Status: DC
  Administered 2022-11-25: 5 [IU] via SUBCUTANEOUS
  Administered 2022-11-25: 11 [IU] via SUBCUTANEOUS
  Administered 2022-11-25: 15 [IU] via SUBCUTANEOUS
  Administered 2022-11-26: 5 [IU] via SUBCUTANEOUS
  Administered 2022-11-26: 8 [IU] via SUBCUTANEOUS

## 2022-11-25 MED ORDER — ACETAMINOPHEN 650 MG RE SUPP: 650.0000 mg | Freq: Four times a day (QID) | RECTAL | Status: DC | PRN

## 2022-11-25 MED ORDER — LIVING WELL WITH DIABETES BOOK: Freq: Once | Status: AC

## 2022-11-25 MED ORDER — ENOXAPARIN SODIUM 40 MG/0.4ML IJ SOSY
40.0000 mg | PREFILLED_SYRINGE | INTRAMUSCULAR | Status: DC
  Administered 2022-11-25 – 2022-11-26 (×2): 40 mg via SUBCUTANEOUS
  Filled 2022-11-25 (×2): qty 0.4

## 2022-11-25 NOTE — Plan of Care (Signed)

## 2022-11-25 NOTE — Discharge Instructions (Addendum)

## 2022-11-25 NOTE — ED Notes (Signed)
ED TO INPATIENT HANDOFF REPORT  ED Nurse Name and Phone #: Toni Amend [phone]  S Name/Age/Gender Mark Acosta 52 y.o. male Room/Bed: APA01/APA01  Code Status   Code Status: Prior  Home/SNF/Other Home Patient oriented to: self, place, time, and situation Is this baseline? Yes   Triage Complete: Triage complete  Chief Complaint Hyperglycemia [R73.9]  Triage Note Pt reports he failed a UDS with his parole office today and was sent to rehab and they discovered his FSBS was 550 and told him to come to the ER.  Pt said he is not a diabetic anymore and his doctor took him off his metfomin.   Allergies No Known Allergies  Level of Care/Admitting Diagnosis ED Disposition     ED Disposition  Admit   Condition  --   Comment  Hospital Area: [facility] [[id number]]  Level of Care: Med-Surg [16]  Covid Evaluation: Asymptomatic - no recent exposure (last 10 days) testing not required  Diagnosis: Hyperglycemia [027253]  Admitting Physician: Frankey Shown [[id number]]  Attending Physician: Frankey Shown [[id number]]          B Medical/Surgery History Past Medical History:  Diagnosis Date   Diabetes mellitus without complication (HCC)    boarderline   Diverticula of colon    Gout    Substance abuse (HCC)    hx etoh, marijuana, cocaine. 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/2014   Past Surgical History:  Procedure Laterality Date   COLONOSCOPY WITH PROPOFOL N/A 06/04/2015   RMR: internal hemorrhoids/anal papilla   ESOPHAGOGASTRODUODENOSCOPY (EGD) WITH PROPOFOL N/A 06/04/2015   RMR: mild erosive reflux   None to date     As of 05/11/15     A IV Location/Drains/Wounds Patient Lines/Drains/Airways Status     Active Line/Drains/Airways     Name Placement date Placement time Site Days   Peripheral IV 11/24/22 20 G Left Antecubital 11/24/22  2030  Antecubital  1            Intake/Output Last 24 hours  Intake/Output Summary (Last 24 hours) at 11/25/2022 0016 Last data filed at 11/24/2022  2356 Gross per 24 hour  Intake 1000 ml  Output 800 ml  Net 200 ml    Labs/Imaging Results for orders placed or performed during the hospital encounter of 11/24/22 (from the past 48 hour(s))  CBG monitoring, ED     Status: Abnormal   Collection Time: 11/24/22  7:37 PM  Result Value Ref Range   Glucose-Capillary 484 (H) 70 - 99 mg/dL    Comment: Glucose reference range applies only to samples taken after fasting for at least 8 hours.  CBC     Status: None   Collection Time: 11/24/22  8:40 PM  Result Value Ref Range   WBC 9.5 4.0 - 10.5 K/uL   RBC 5.45 4.22 - 5.81 MIL/uL   Hemoglobin 15.9 13.0 - 17.0 g/dL   HCT 38.7 56.4 - 33.2 %   MCV 82.6 80.0 - 100.0 fL   MCH 29.2 26.0 - 34.0 pg   MCHC 35.3 30.0 - 36.0 g/dL   RDW 95.1 88.4 - 16.6 %   Platelets 282 150 - 400 K/uL   nRBC 0.0 0.0 - 0.2 %    Comment: Performed at [facility], [address] [place], [place] [address]  Comprehensive metabolic panel     Status: Abnormal   Collection Time: 11/24/22  8:40 PM  Result Value Ref Range   Sodium 127 (L) 135 - 145 mmol/L   Potassium 3.7 3.5 - 5.1 mmol/L  Chloride 89 (L) 98 - 111 mmol/L   CO2 27 22 - 32 mmol/L   Glucose, Bld 499 (H) 70 - 99 mg/dL    Comment: Glucose reference range applies only to samples taken after fasting for at least 8 hours.   BUN 14 6 - 20 mg/dL   Creatinine, Ser 8.65 0.61 - 1.24 mg/dL   Calcium 9.1 8.9 - 78.4 mg/dL   Total Protein 7.7 6.5 - 8.1 g/dL   Albumin 4.1 3.5 - 5.0 g/dL   AST 22 15 - 41 U/L   ALT 30 0 - 44 U/L   Alkaline Phosphatase 113 38 - 126 U/L   Total Bilirubin 0.5 0.3 - 1.2 mg/dL   GFR, Estimated >69 >62 mL/min    Comment: (NOTE) Calculated using the CKD-EPI Creatinine Equation (2021)    Anion gap 11 5 - 15    Comment: Performed at [facility], [address], [place], [place] [address]  Lactic acid, plasma     Status: Abnormal   Collection Time: 11/24/22  8:40 PM  Result Value Ref Range   Lactic Acid, Venous 2.3 (HH) 0.5 - 1.9 mmol/L     Comment: CRITICAL RESULT CALLED TO, READ BACK BY AND VERIFIED WITH C.Bohdan Macho AT 2136 ON 08.22.24 BY ADGER J Performed at [facility], [address], [place], [place] [address]   Culture, blood (routine x 2)     Status: None (Preliminary result)   Collection Time: 11/24/22  8:40 PM   Specimen: BLOOD  Result Value Ref Range   Specimen Description BLOOD RIGHT ANTECUBITAL    Special Requests      BOTTLES DRAWN AEROBIC AND ANAEROBIC Blood Culture adequate volume Performed at [facility], [address] [place], [place] [address]    Culture PENDING    Report Status PENDING   Culture, blood (routine x 2)     Status: None (Preliminary result)   Collection Time: 11/24/22  8:40 PM   Specimen: BLOOD  Result Value Ref Range   Specimen Description BLOOD LEFT ANTECUBITAL    Special Requests      BOTTLES DRAWN AEROBIC AND ANAEROBIC Blood Culture adequate volume Performed at [facility], [address], [place], [place] [address]    Culture PENDING    Report Status PENDING   Beta-hydroxybutyric acid     Status: None   Collection Time: 11/24/22  8:40 PM  Result Value Ref Range   Beta-Hydroxybutyric Acid 0.10 0.05 - 0.27 mmol/L    Comment: Performed at [facility], [address] [place], [place] [address]  Blood gas, venous     Status: Abnormal   Collection Time: 11/24/22  9:02 PM  Result Value Ref Range   pH, Ven 7.43 7.25 - 7.43   pCO2, Ven 47 44 - 60 mmHg   pO2, Ven 67 (H) 32 - 45 mmHg   Bicarbonate 31.2 (H) 20.0 - 28.0 mmol/L   Acid-Base Excess 5.8 (H) 0.0 - 2.0 mmol/L   O2 Saturation 95.3 %   Patient temperature 36.8    Collection site BLOOD RIGHT HAND    Drawn by [phone]     Comment: Performed at [facility], [address], [place], [place] [address]  Urinalysis, Routine w reflex microscopic -Urine, Clean Catch     Status: Abnormal   Collection Time: 11/24/22  9:19 PM  Result Value Ref Range   Color, Urine STRAW (A) YELLOW   APPearance CLEAR CLEAR   Specific Gravity, Urine  1.030 1.005 - 1.030  pH 6.0 5.0 - 8.0   Glucose, UA >=500 (A) NEGATIVE mg/dL   Hgb urine dipstick NEGATIVE NEGATIVE   Bilirubin Urine NEGATIVE NEGATIVE   Ketones, ur NEGATIVE NEGATIVE mg/dL   Protein, ur NEGATIVE NEGATIVE mg/dL   Nitrite NEGATIVE NEGATIVE   Leukocytes,Ua NEGATIVE NEGATIVE   RBC / HPF 0-5 0 - 5 RBC/hpf   WBC, UA 0-5 0 - 5 WBC/hpf   Bacteria, UA NONE SEEN NONE SEEN   Squamous Epithelial / HPF 0-5 0 - 5 /HPF    Comment: Performed at [facility], [address] [place], [place] [address]  Lactic acid, plasma     Status: None   Collection Time: 11/24/22 10:31 PM  Result Value Ref Range   Lactic Acid, Venous 1.6 0.5 - 1.9 mmol/L    Comment: Performed at [facility], [address] [place], [place] [address]  CBG monitoring, ED     Status: Abnormal   Collection Time: 11/24/22 11:37 PM  Result Value Ref Range   Glucose-Capillary 341 (H) 70 - 99 mg/dL    Comment: Glucose reference range applies only to samples taken after fasting for at least 8 hours.   CT ABDOMEN PELVIS W CONTRAST  Result Date: 11/24/2022 CLINICAL DATA:  Left lower quadrant abdominal pain. EXAM: CT ABDOMEN AND PELVIS WITH CONTRAST TECHNIQUE: Multidetector CT imaging of the abdomen and pelvis was performed using the standard protocol following bolus administration of intravenous contrast. RADIATION DOSE REDUCTION: This exam was performed according to the departmental dose-optimization program which includes automated exposure control, adjustment of the mA and/or kV according to patient size and/or use of iterative reconstruction technique. CONTRAST:  OMNIPAQUE IOHEXOL 300 MG/ML  SOLN COMPARISON:  CT abdomen and pelvis 09/02/2014 FINDINGS: Lower chest: No acute abnormality. Hepatobiliary: Unremarkable liver. Normal gallbladder. No biliary dilation. Pancreas: Unremarkable. Spleen: Unremarkable. Adrenals/Urinary Tract: Normal adrenal glands. No urinary calculi or hydronephrosis. Bladder is unremarkable.  Stomach/Bowel: Normal caliber large and small bowel. No bowel wall thickening. The appendix is normal.Stomach is within normal limits. Vascular/Lymphatic: No significant vascular findings are present. No enlarged abdominal or pelvic lymph nodes. Reproductive: Unremarkable. Other: No free intraperitoneal fluid or air. Musculoskeletal: No acute fracture. IMPRESSION: No acute abnormality in the abdomen or pelvis. Electronically Signed   By: Minerva Fester M.D.   On: 11/24/2022 23:36   DG Chest Portable 1 View  Result Date: 11/24/2022 CLINICAL DATA:  Hyperglycemia and cough EXAM: PORTABLE CHEST 1 VIEW COMPARISON:  Radiograph report from 03/01/2014 FINDINGS: The heart size and mediastinal contours are within normal limits. Low lung volumes accentuate pulmonary vascularity. Both lungs are clear. The visualized skeletal structures are unremarkable. IMPRESSION: No active disease. Electronically Signed   By: Minerva Fester M.D.   On: 11/24/2022 21:34    Pending Labs Unresulted Labs (From admission, onward)     Start     Ordered   11/24/22 2005  Hemoglobin A1c  Once,   URGENT        11/24/22 2004            Vitals/Pain Today's Vitals   11/24/22 2100 11/24/22 2115 11/24/22 2245 11/24/22 2300  BP: (!) 155/92 108/76 (!) 149/114 (!) 150/93  Pulse: (!) 110 (!) 110 (!) 110 (!) 102  Resp: (!) 25 (!) 22 19 15   Temp:    98.3 F (36.8 C)  TempSrc:    Oral  SpO2: 97% 98% 98% 98%  Weight:      Height:      PainSc:  Isolation Precautions No active isolations  Medications Medications  insulin aspart (novoLOG) injection 6 Units (has no administration in time range)  sodium chloride 0.9 % bolus 1,000 mL (0 mLs Intravenous Stopped 11/24/22 2241)  iohexol (OMNIPAQUE) 300 MG/ML solution 100 mL (100 mLs Intravenous Contrast Given 11/24/22 2310)  sodium chloride 0.9 % bolus 1,000 mL (1,000 mLs Intravenous New Bag/Given 11/24/22 2355)    Mobility walks     Focused  Assessments    R Recommendations: See Admitting Provider Note  Report given to:   Additional Notes: A&O; ambu; continent with urinal; 20G LAC

## 2022-11-25 NOTE — TOC CM/SW Note (Signed)
Transition of Care [facility]) - Inpatient Brief Assessment   Patient Details  Name: Mark Acosta MRN: [id number] Date of Birth: 01-21-1971  Transition of Care [facility]) CM/SW Contact:    Villa Herb, LCSWA Phone Number: 11/25/2022, 11:13 AM   Clinical Narrative: Transition of Care Department [facility]) has reviewed patient and no TOC needs have been identified at this time. We will continue to monitor patient advancement through interdisciplinary progression rounds. If new patient transition needs arise, please place a TOC consult.  Transition of Care Asessment: Insurance and Status: Insurance coverage has been reviewed Patient has primary care physician: Yes (PCP list added to AVS for pt to review.) Home environment has been reviewed: from home Prior level of function:: independent Prior/Current Home Services: No current home services Social Determinants of Health Reivew: SDOH reviewed no interventions necessary Readmission risk has been reviewed: Yes Transition of care needs: no transition of care needs at this time

## 2022-11-25 NOTE — Inpatient Diabetes Management (Addendum)
Inpatient Diabetes Program Recommendations  AACE/ADA: New Consensus Statement on Inpatient Glycemic Control (2015)  Target Ranges:  Prepandial:   less than 140 mg/dL      Peak postprandial:   less than 180 mg/dL (1-2 hours)      Critically ill patients:  140 - 180 mg/dL    Latest Reference Range & Units 11/24/22 20:40  Hemoglobin A1C 4.8 - 5.6 % 14.4 (H)  (H): Data is abnormally high   Latest Reference Range & Units 11/24/22 19:37 11/24/22 23:37 11/25/22 03:25 11/25/22 07:46 11/25/22 11:13  Glucose-Capillary 70 - 99 mg/dL 914 (H) 782 (H)  6 units Novolog  345 (H)  4 units Novolog  338 (H)  15 units Novolog  15 units Semglee  353 (H)  15 units Novolog   (H): Data is abnormally high     Admit with: Hyperglycemia secondary to poorly controlled T2DM    History: DM2   Home DM Meds: Metformin 500 mg daily (has NOT been taking)   Current Orders: Semglee 15 units Daily                            Novolog Novolog Moderate Correction Scale/ SSI (0-15 units) TID AC + HS   For Discharge:  Lantus Insulin Pen [id number] Novolog Insulin Pen [id number] Insulin Pen Needles [id number] CBG meter Kit: [id number]    Called pt by phone to discuss diabetes, A1c, need for insulin for home.  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  Pt told me his Mom had diabetes.  He was diagnosed years ago but stopped Metformin b/c his CBGs were "good" after he lost weight.  Does NOT have CBG meter at home.  States he knows what to eat, just needs to eat "better".  Pt told me he was drinking a gallon of milk per day the last week and put chocolate syrup in the milk--Also drinks lots of regular Pepsi and Gatorade.     Discussed admission labs with pt, A1C results.  Explained what an A1C is, basic pathophysiology of DM Type 2, basic home care, basic diabetes diet nutrition principles, importance of checking CBGs and maintaining good CBG control to prevent long-term and short-term complications.   Reviewed signs and symptoms of hyperglycemia and hypoglycemia and how to treat hypoglycemia at home.  Also reviewed blood sugar goals and A1c goals for home.  Also discussed DM diet information with patient.  Encouraged patient to avoid beverages with sugar (regular soda, sweet tea, lemonade, fruit juice, milk) and to consume mostly water.  Discussed what foods contain carbohydrates and how carbohydrates affect the body's blood sugar levels.  Encouraged patient to be careful with his portion sizes (especially grains, starchy vegetables, and fruits).    RNs to provide ongoing basic DM education at bedside with this patient.  Have ordered educational booklet, insulin starter kit.    RN has been working with pt on self injections.  RN has also demo'ed insulin pen with pt.  Have placed diabetes educational info into AVS and also placed several diabetes videos (QR codes) into AVS (pt states he knows how to use phone camera for QR code).     --Will follow patient during hospitalization--  Ambrose Finland RN, MSN, CDCES Diabetes Coordinator Inpatient Glycemic Control Team Team Pager: [phone] (8a-5p)

## 2022-11-25 NOTE — H&P (Signed)
History and Physical    Patient: Mark Acosta ZOX:[id number] DOB: Dec 13, 1970 DOA: 11/24/2022 DOS: the patient was seen and examined on 11/25/2022 PCP: Pcp, No  Patient coming from: Home  Chief Complaint:  Chief Complaint  Patient presents with   Hyperglycemia   HPI: Mark Acosta is a 52 y.o. male with medical history significant of hypertension, hyperlipidemia, type 2 diabetes mellitus, depression who presents to the emergency department due to elevated blood glucose level.  Patient states that he was taken off metformin several years ago due to better controled blood glucose level after losing some weight.  He complained of having some dizziness yesterday, so he went with his girlfriend to check his blood glucose level checked and it was noted to be 551, so patient decided to go to the ED for further evaluation and management.  He also complained of small amount of white discharge on his penis which he thinks to be due to yeast infection, he states that he has been treated for this condition.  Patient states that he recently relapsed on methamphetamine.  He denies chest pain, shortness of breath, fever, chills.   ED Course:  In the emergency department, patient was tachycardic and tachypneic, BP was 160/104, other vital signs are within normal range.  Workup in the ED showed normal CBC and BMP except for sodium of 127, chloride 89, blood glucose 499, lactic acid 2.3 > 1.6, beta-hydroxybutyrate acid 0.10.  Blood culture pending. CT abdomen and pelvis with contrast showed no acute abnormality in the abdomen or pelvis Chest x-ray showed no active disease patient. 6 units of insulin was given.  Hospitalist was asked to admit patient for further evaluation and management.    Review of Systems: Review of systems as noted in the HPI. All other systems reviewed and are negative.   Past Medical History:  Diagnosis Date   Diabetes mellitus without complication (HCC)    boarderline   Diverticula  of colon    Gout    Substance abuse (HCC)    hx etoh, marijuana, cocaine. quit 02/2014   Past Surgical History:  Procedure Laterality Date   COLONOSCOPY WITH PROPOFOL N/A 06/04/2015   RMR: internal hemorrhoids/anal papilla   ESOPHAGOGASTRODUODENOSCOPY (EGD) WITH PROPOFOL N/A 06/04/2015   RMR: mild erosive reflux   None to date     As of 05/11/15    Social History:  reports that he has never smoked. He has never used smokeless tobacco. He reports that he does not currently use alcohol. He reports current drug use. Drugs: Cocaine, Marijuana, and Methamphetamines.   No Known Allergies  Family History  Problem Relation Age of Onset   Colon cancer Neg Hx      Prior to Admission medications   Medication Sig Start Date End Date Taking? Authorizing Provider  docusate sodium (COLACE) 100 MG capsule Take 1 capsule (100 mg total) by mouth daily. (May purchase from over the counter): For constipation 07/21/15   Armandina Stammer I, NP  escitalopram (LEXAPRO) 5 MG tablet Take 3 tablets (15 mg total) by mouth daily. For depressiom 07/21/15   Armandina Stammer I, NP  gabapentin (NEURONTIN) 300 MG capsule Take 1 capsule (300 mg total) by mouth at bedtime. For agitation 07/21/15   Armandina Stammer I, NP  hydrOXYzine (ATARAX/VISTARIL) 50 MG tablet Take 1 tablet (50 mg total) by mouth every 6 (six) hours as needed for anxiety. 07/21/15   Armandina Stammer I, NP  lamoTRIgine (LAMICTAL) 25 MG tablet Take 1 tablet (25 mg total)  by mouth 2 (two) times daily. For mood stabilization 07/21/15   Armandina Stammer I, NP  lisinopril (PRINIVIL,ZESTRIL) 20 MG tablet Take 1 tablet (20 mg total) by mouth daily. For high blood pressure 07/21/15   Nwoko, Nicole Kindred I, NP  metFORMIN (GLUCOPHAGE-XR) 500 MG 24 hr tablet Take 1 tablet (500 mg total) by mouth daily. For diabetes 07/21/15   Armandina Stammer I, NP  metoprolol (LOPRESSOR) 50 MG tablet Take 1 tablet (50 mg total) by mouth 2 (two) times daily. For high blood pressure 07/21/15   Armandina Stammer I, NP   pravastatin (PRAVACHOL) 20 MG tablet Take 1 tablet (20 mg total) by mouth daily at 6 PM. For high cholesterol 07/21/15   Armandina Stammer I, NP  prazosin (MINIPRESS) 2 MG capsule Take 1 capsule (2 mg total) by mouth at bedtime. For nightmare 07/21/15   Armandina Stammer I, NP  testosterone cypionate (DEPOTESTOSTERONE CYPIONATE) 200 MG/ML injection Inject 1 mL (200 mg total) into the muscle every 14 (fourteen) days. For hormonal replacement 07/21/15   Armandina Stammer I, NP  traZODone (DESYREL) 100 MG tablet Take 2 tablets (200 mg total) by mouth at bedtime. For insomnia 07/21/15   Armandina Stammer I, NP    Physical Exam: BP (!) 159/96 (BP Location: Right Arm)   Pulse (!) 106   Temp 98.2 F (36.8 C)   Resp 18   Ht 5' 5.98" (1.676 m)   Wt 101.8 kg   SpO2 100%   BMI 36.24 kg/m   General: 52 y.o. year-old male well developed well nourished in no acute distress.  Alert and oriented x3. HEENT: NCAT, EOMI Neck: Supple, trachea medial Cardiovascular: Tachycardia.  Regular rate and rhythm with no rubs or gallops.  No thyromegaly or JVD noted.  No lower extremity edema. 2/4 pulses in all 4 extremities. Respiratory: Apnea.  Clear to auscultation with no wheezes or rales. Good inspiratory effort. Abdomen: Soft, nontender nondistended with normal bowel sounds x4 quadrants. Muskuloskeletal: No cyanosis, clubbing or edema noted bilaterally Neuro: CN II-XII intact, strength 5/5 x 4, sensation, reflexes intact Skin: No ulcerative lesions noted or rashes Psychiatry: Judgement and insight appear normal. Mood is appropriate for condition and setting          Labs on Admission:  Basic Metabolic Panel: Recent Labs  Lab 11/24/22 2040  NA 127*  K 3.7  CL 89*  CO2 27  GLUCOSE 499*  BUN 14  CREATININE 0.62  CALCIUM 9.1   Liver Function Tests: Recent Labs  Lab 11/24/22 2040  AST 22  ALT 30  ALKPHOS 113  BILITOT 0.5  PROT 7.7  ALBUMIN 4.1   No results for input(s): "LIPASE", "AMYLASE" in the last 168  hours. No results for input(s): "AMMONIA" in the last 168 hours. CBC: Recent Labs  Lab 11/24/22 2040  WBC 9.5  HGB 15.9  HCT 45.0  MCV 82.6  PLT 282   Cardiac Enzymes: No results for input(s): "CKTOTAL", "CKMB", "CKMBINDEX", "TROPONINI" in the last 168 hours.  BNP (last 3 results) No results for input(s): "BNP" in the last 8760 hours.  ProBNP (last 3 results) No results for input(s): "PROBNP" in the last 8760 hours.  CBG: Recent Labs  Lab 11/24/22 1937 11/24/22 2337  GLUCAP 484* 341*    Radiological Exams on Admission: CT ABDOMEN PELVIS W CONTRAST  Result Date: 11/24/2022 CLINICAL DATA:  Left lower quadrant abdominal pain. EXAM: CT ABDOMEN AND PELVIS WITH CONTRAST TECHNIQUE: Multidetector CT imaging of the abdomen and pelvis was performed using  the standard protocol following bolus administration of intravenous contrast. RADIATION DOSE REDUCTION: This exam was performed according to the departmental dose-optimization program which includes automated exposure control, adjustment of the mA and/or kV according to patient size and/or use of iterative reconstruction technique. CONTRAST:  OMNIPAQUE IOHEXOL 300 MG/ML  SOLN COMPARISON:  CT abdomen and pelvis 09/02/2014 FINDINGS: Lower chest: No acute abnormality. Hepatobiliary: Unremarkable liver. Normal gallbladder. No biliary dilation. Pancreas: Unremarkable. Spleen: Unremarkable. Adrenals/Urinary Tract: Normal adrenal glands. No urinary calculi or hydronephrosis. Bladder is unremarkable. Stomach/Bowel: Normal caliber large and small bowel. No bowel wall thickening. The appendix is normal.Stomach is within normal limits. Vascular/Lymphatic: No significant vascular findings are present. No enlarged abdominal or pelvic lymph nodes. Reproductive: Unremarkable. Other: No free intraperitoneal fluid or air. Musculoskeletal: No acute fracture. IMPRESSION: No acute abnormality in the abdomen or pelvis. Electronically Signed   By: Minerva Fester M.D.   On: 11/24/2022 23:36   DG Chest Portable 1 View  Result Date: 11/24/2022 CLINICAL DATA:  Hyperglycemia and cough EXAM: PORTABLE CHEST 1 VIEW COMPARISON:  Radiograph report from 03/01/2014 FINDINGS: The heart size and mediastinal contours are within normal limits. Low lung volumes accentuate pulmonary vascularity. Both lungs are clear. The visualized skeletal structures are unremarkable. IMPRESSION: No active disease. Electronically Signed   By: Minerva Fester M.D.   On: 11/24/2022 21:34    EKG: I independently viewed the EKG done and my findings are as followed: Sinus tachycardia at rate of 118 bpm  Assessment/Plan Present on Admission:  Severe recurrent major depression without psychotic features (HCC)  Essential hypertension, benign  Mixed hyperlipidemia  Principal Problem:   Type 2 diabetes mellitus with hyperglycemia (HCC) Active Problems:   Essential hypertension, benign   Mixed hyperlipidemia   Severe recurrent major depression without psychotic features (HCC)   Obesity (BMI 30-39.9)   Pseudohyponatremia   Lactic acidosis  Hyperglycemia secondary to poorly controlled T2DM CBG on admission was 499, 6 units of insulin subcu was given in the ED Continue Semglee 10 units nightly and adjust dose accordingly Continue ISS and hypoglycemia protocol Metformin will be held at this time  Pseudohyponatremia Sodium 127, corrected sodium level based on CBG (499) is 133 Continue to monitor sodium level  Lactic acidosis-resolved  Urethral discharge rule out STD NAAT test for GC/chlamydia will be done  Essential hypertension Continue lisinopril, metoprolol  Mixed hyperlipidemia Continue pravastatin  Depression Continue Lexapro  History of drug use Patient states that he recently relapsed on use of methamphetamine Urine drug screen pending  DVT prophylaxis: Lovenox   Advance Care Planning: Full code  Consults: None  Family Communication: None at  bedside  Severity of Illness: The appropriate patient status for this patient is OBSERVATION. Observation status is judged to be reasonable and necessary in order to provide the required intensity of service to ensure the patient's safety. The patient's presenting symptoms, physical exam findings, and initial radiographic and laboratory data in the context of their medical condition is felt to place them at decreased risk for further clinical deterioration. Furthermore, it is anticipated that the patient will be medically stable for discharge from the hospital within 2 midnights of admission.   Author: Frankey Shown, DO 11/25/2022 3:06 AM  For on call review [email or web address].

## 2022-11-25 NOTE — Hospital Course (Signed)
52 year old male with a history of diabetes mellitus type 2, hypertension, hyperlipidemia presenting secondary to elevated CBGs.  The patient states that he has been having polydipsia and polyuria for about 2 weeks.  He states that he has been losing some weight, but has difficulty quantifying how much.  He states that he checked his CBGs on a glucometer at home and noted to be 531.  As result he came to emergency department for further evaluation and treatment.  He denies any fevers, chills, chest pain, short of breath, nausea, vomiting or diarrhea, hematochezia, melena.  He states that he previously took metformin about 2 to 3 years ago,.  He was stopped because he was told that his sugars were better. Notably, the patient states that he recently relapsed on methamphetamine and cocaine.  He last used about 2 days prior to admission.  He denies any alcohol use.   In the ED, the patient was afebrile and hemodynamically stable with oxygen saturation of 100% room air.  WBC 9.5, hemoglobin 13.9, platelets 268,000.  Sodium 131, potassium 3.8, bicarbonate 26, serum creatinine 0.57.  LFTs were unremarkable.  CT of the abdomen and pelvis was negative for any acute findings.  Chest x-ray was negative.

## 2022-11-25 NOTE — Progress Notes (Signed)
Patient asked to void in urinal for chlamydia specimen needed by lab has not voided in urinal thus far in shift , have asked 2 times

## 2022-11-25 NOTE — Progress Notes (Addendum)
PROGRESS NOTE  Mark Acosta:[id number] DOB: April 26, 1970 DOA: 11/24/2022 PCP: Pcp, No  Brief History:  52 year old male with a history of diabetes mellitus type 2, hypertension, hyperlipidemia presenting secondary to elevated CBGs.  The patient states that he has been having polydipsia and polyuria for about 2 weeks.  He states that he has been losing some weight, but has difficulty quantifying how much.  He states that he checked his CBGs on a glucometer at home and noted to be 531.  As result he came to emergency department for further evaluation and treatment.  He denies any fevers, chills, chest pain, short of breath, nausea, vomiting or diarrhea, hematochezia, melena.  He states that he previously took metformin about 2 to 3 years ago,.  He was stopped because he was told that his sugars were better. Notably, the patient states that he recently relapsed on methamphetamine and cocaine.  He last used about 2 days prior to admission.  He denies any alcohol use.   In the ED, the patient was afebrile and hemodynamically stable with oxygen saturation of 100% room air.  WBC 9.5, hemoglobin 13.9, platelets 268,000.  Sodium 131, potassium 3.8, bicarbonate 26, serum creatinine 0.57.  LFTs were unremarkable.  CT of the abdomen and pelvis was negative for any acute findings.  Chest x-ray was negative.   Assessment/Plan: Hyperglycemic hyperosmolar nonketotic state -Improved with IV fluids -Start Lantus 15 units daily -NovoLog sliding scale -Continue IV fluids  Hyponatremia -Secondary to hyperglycemia -Improving with IV fluids  Essential hypertension -Currently not on any antihypertensive medications -Monitor blood pressures while inpatient  Mixed hyperlipidemia -Check lipid panel  Polysubstance abuse -Including methamphetamine and cocaine -Cessation discussed -Urine drug screen  Oropharyngeal candidiasis -Start Magic mouthwash  Class 2 obesity -BMI 36.24 -lifestyle  modification      Family Communication:  no Family at bedside  Consultants:  none  Code Status:  FULL   DVT Prophylaxis:  [place] Lovenox   Procedures: As Listed in Progress Note Above  Antibiotics: None    Total time spent 50 minutes.  Greater than 50% spent face to face counseling and coordinating care.    Subjective: Patient denies fevers, chills, headache, chest pain, dyspnea, nausea, vomiting, diarrhea, abdominal pain, dysuria, hematuria, hematochezia, and melena.   Objective: Vitals:   11/25/22 0103 11/25/22 0105 11/25/22 0330 11/25/22 0823  BP: (!) 159/96  (!) 149/86 (!) 139/93  Pulse: (!) 106  (!) 109 (!) 107  Resp: 18  20 19   Temp: 98.2 F (36.8 C)  98.4 F (36.9 C) 97.7 F (36.5 C)  TempSrc:    Oral  SpO2: 100%  99% 97%  Weight:  101.8 kg    Height:  5' 5.98" (1.676 m)      Intake/Output Summary (Last 24 hours) at 11/25/2022 1241 Last data filed at 11/25/2022 8119 Gross per 24 hour  Intake 1480 ml  Output 800 ml  Net 680 ml   Weight change:  Exam:  General:  Pt is alert, follows commands appropriately, not in acute distress HEENT: No icterus, No thrush, No neck mass, [place]/AT Cardiovascular: RRR, S1/S2, no rubs, no gallops Respiratory: CTA bilaterally, no wheezing, no crackles, no rhonchi Abdomen: Soft/+BS, non tender, non distended, no guarding Extremities: No edema, No lymphangitis, No petechiae, No rashes, no synovitis   Data Reviewed: I have personally reviewed following labs and imaging studies Basic Metabolic Panel: Recent Labs  Lab 11/24/22 2040 11/25/22 0407  NA 127*  131*  K 3.7 3.8  CL 89* 96*  CO2 27 26  GLUCOSE 499* 320*  BUN 14 14  CREATININE 0.62 0.57*  CALCIUM 9.1 8.4*  MG  --  2.0  PHOS  --  4.1   Liver Function Tests: Recent Labs  Lab 11/24/22 2040 11/25/22 0407  AST 22 17  ALT 30 26  ALKPHOS 113 101  BILITOT 0.5 0.4  PROT 7.7 6.7  ALBUMIN 4.1 3.5   No results for input(s): "LIPASE", "AMYLASE" in the last  168 hours. No results for input(s): "AMMONIA" in the last 168 hours. Coagulation Profile: No results for input(s): "INR", "PROTIME" in the last 168 hours. CBC: Recent Labs  Lab 11/24/22 2040 11/25/22 0407  WBC 9.5 8.4  HGB 15.9 14.6  HCT 45.0 41.0  MCV 82.6 82.3  PLT 282 268   Cardiac Enzymes: No results for input(s): "CKTOTAL", "CKMB", "CKMBINDEX", "TROPONINI" in the last 168 hours. BNP: Invalid input(s): "POCBNP" CBG: Recent Labs  Lab 11/24/22 1937 11/24/22 2337 11/25/22 0325 11/25/22 0746 11/25/22 1113  GLUCAP 484* 341* 345* 338* 353*   HbA1C: Recent Labs    11/24/22 2040  HGBA1C 14.4*   Urine analysis:    Component Value Date/Time   COLORURINE STRAW (A) 11/24/2022 2119   APPEARANCEUR CLEAR 11/24/2022 2119   LABSPEC 1.030 11/24/2022 2119   PHURINE 6.0 11/24/2022 2119   GLUCOSEU >=500 (A) 11/24/2022 2119   HGBUR NEGATIVE 11/24/2022 2119   BILIRUBINUR NEGATIVE 11/24/2022 2119   KETONESUR NEGATIVE 11/24/2022 2119   PROTEINUR NEGATIVE 11/24/2022 2119   UROBILINOGEN 0.2 09/02/2014 0959   NITRITE NEGATIVE 11/24/2022 2119   LEUKOCYTESUR NEGATIVE 11/24/2022 2119   Sepsis Labs: @LABRCNTIP (procalcitonin:4,lacticidven:4) ) Recent Results (from the past 240 hour(s))  Culture, blood (routine x 2)     Status: None (Preliminary result)   Collection Time: 11/24/22  8:40 PM   Specimen: BLOOD  Result Value Ref Range Status   Specimen Description BLOOD RIGHT ANTECUBITAL  Final   Special Requests   Final    BOTTLES DRAWN AEROBIC AND ANAEROBIC Blood Culture adequate volume   Culture   Final    NO GROWTH < 12 HOURS Performed at [facility], [address], [place], [place] [address]    Report Status PENDING  Incomplete  Culture, blood (routine x 2)     Status: None (Preliminary result)   Collection Time: 11/24/22  8:40 PM   Specimen: BLOOD  Result Value Ref Range Status   Specimen Description BLOOD LEFT ANTECUBITAL  Final   Special Requests   Final    BOTTLES  DRAWN AEROBIC AND ANAEROBIC Blood Culture adequate volume   Culture   Final    NO GROWTH < 12 HOURS Performed at [facility], [address] [place], [place] [address]    Report Status PENDING  Incomplete     Scheduled Meds:  enoxaparin (LOVENOX) injection  40 mg Subcutaneous Q24H   insulin aspart  0-15 Units Subcutaneous TID WC   insulin glargine-yfgn  15 Units Subcutaneous Daily   Continuous Infusions:  0.9 % NaCl with KCl 20 mEq / L 100 mL/hr at 11/25/22 0820    Procedures/Studies: CT ABDOMEN PELVIS W CONTRAST  Result Date: 11/24/2022 CLINICAL DATA:  Left lower quadrant abdominal pain. EXAM: CT ABDOMEN AND PELVIS WITH CONTRAST TECHNIQUE: Multidetector CT imaging of the abdomen and pelvis was performed using the standard protocol following bolus administration of intravenous contrast. RADIATION DOSE REDUCTION: This exam was performed according to the departmental dose-optimization program which  includes automated exposure control, adjustment of the mA and/or kV according to patient size and/or use of iterative reconstruction technique. CONTRAST:  OMNIPAQUE IOHEXOL 300 MG/ML  SOLN COMPARISON:  CT abdomen and pelvis 09/02/2014 FINDINGS: Lower chest: No acute abnormality. Hepatobiliary: Unremarkable liver. Normal gallbladder. No biliary dilation. Pancreas: Unremarkable. Spleen: Unremarkable. Adrenals/Urinary Tract: Normal adrenal glands. No urinary calculi or hydronephrosis. Bladder is unremarkable. Stomach/Bowel: Normal caliber large and small bowel. No bowel wall thickening. The appendix is normal.Stomach is within normal limits. Vascular/Lymphatic: No significant vascular findings are present. No enlarged abdominal or pelvic lymph nodes. Reproductive: Unremarkable. Other: No free intraperitoneal fluid or air. Musculoskeletal: No acute fracture. IMPRESSION: No acute abnormality in the abdomen or pelvis. Electronically Signed   By: Minerva Fester M.D.   On: 11/24/2022 23:36   DG Chest  Portable 1 View  Result Date: 11/24/2022 CLINICAL DATA:  Hyperglycemia and cough EXAM: PORTABLE CHEST 1 VIEW COMPARISON:  Radiograph report from 03/01/2014 FINDINGS: The heart size and mediastinal contours are within normal limits. Low lung volumes accentuate pulmonary vascularity. Both lungs are clear. The visualized skeletal structures are unremarkable. IMPRESSION: No active disease. Electronically Signed   By: Minerva Fester M.D.   On: 11/24/2022 21:34    Catarina Hartshorn, DO  [facility]  If 7PM-7AM, please contact night-coverage [email or web address] Password [facility] 11/25/2022, 12:41 PM   LOS: 0 days

## 2022-11-25 NOTE — TOC Benefit Eligibility Note (Signed)
Pharmacy Patient Advocate Encounter  Insurance verification completed.    The patient is insured through [employer]. Patient has [employer], may use a copay card, and/or apply for patient assistance if available.    Ran test claim for Lantus Solostar and the current 30 day co-pay is $35.00.  Ran test claim for Novolog Flexpen and the current 30 day co-pay is $30.49.  This test claim was processed through [facility]- copay amounts may vary at other pharmacies due to pharmacy/plan contracts, or as the patient moves through the different stages of their insurance plan.

## 2022-11-26 DIAGNOSIS — E1165 Type 2 diabetes mellitus with hyperglycemia: Secondary | ICD-10-CM | POA: Diagnosis not present

## 2022-11-26 DIAGNOSIS — B370 Candidal stomatitis: Secondary | ICD-10-CM | POA: Diagnosis not present

## 2022-11-26 DIAGNOSIS — E1100 Type 2 diabetes mellitus with hyperosmolarity without nonketotic hyperglycemic-hyperosmolar coma (NKHHC): Secondary | ICD-10-CM | POA: Diagnosis not present

## 2022-11-26 LAB — BASIC METABOLIC PANEL
Anion gap: 8 (ref 5–15)
BUN: 15 mg/dL (ref 6–20)
CO2: 26 mmol/L (ref 22–32)
Calcium: 8.2 mg/dL — ABNORMAL LOW (ref 8.9–10.3)
Chloride: 98 mmol/L (ref 98–111)
Creatinine, Ser: 0.52 mg/dL — ABNORMAL LOW (ref 0.61–1.24)
GFR, Estimated: >60 mL/min (ref >60)
Glucose, Bld: 279 mg/dL — ABNORMAL HIGH (ref 70–99)
Potassium: 3.8 mmol/L (ref 3.5–5.1)
Sodium: 132 mmol/L — ABNORMAL LOW (ref 135–145)

## 2022-11-26 LAB — LIPID PANEL
Cholesterol: 155 mg/dL (ref 0–200)
HDL: 28 mg/dL — ABNORMAL LOW (ref >40)
LDL Cholesterol: UNDETERMINED mg/dL (ref 0–99)
Total CHOL/HDL Ratio: 5.5 ratio
Triglycerides: 456 mg/dL — ABNORMAL HIGH (ref <150)
VLDL: UNDETERMINED mg/dL (ref 0–40)

## 2022-11-26 LAB — GLUCOSE, CAPILLARY
Glucose-Capillary: 228 mg/dL — ABNORMAL HIGH (ref 70–99)
Glucose-Capillary: 273 mg/dL — ABNORMAL HIGH (ref 70–99)

## 2022-11-26 LAB — MAGNESIUM: Magnesium: 1.7 mg/dL (ref 1.7–2.4)

## 2022-11-26 LAB — RPR: RPR Ser Ql: NONREACTIVE

## 2022-11-26 LAB — LDL CHOLESTEROL, DIRECT: Direct LDL: 81 mg/dL (ref 0–99)

## 2022-11-26 MED ORDER — PHENOL 1.4 % MT LIQD
1.0000 | OROMUCOSAL | Status: DC | PRN
  Administered 2022-11-26: 1 via OROMUCOSAL
  Filled 2022-11-26: qty 177

## 2022-11-26 MED ORDER — FREESTYLE LIBRE 3 SENSOR MISC: 0 refills | Status: AC

## 2022-11-26 MED ORDER — INSULIN GLARGINE-YFGN 100 UNIT/ML ~~LOC~~ SOLN
25.0000 [IU] | Freq: Every day | SUBCUTANEOUS | Status: DC
  Filled 2022-11-26: qty 0.25

## 2022-11-26 MED ORDER — LANCETS MISC. MISC: 1.0000 | Freq: Three times a day (TID) | 0 refills | Status: AC

## 2022-11-26 MED ORDER — FENOFIBRATE 160 MG PO TABS: 160.0000 mg | ORAL_TABLET | Freq: Every day | ORAL | Status: DC

## 2022-11-26 MED ORDER — MAGIC MOUTHWASH: 10.0000 mL | Freq: Four times a day (QID) | ORAL | 0 refills | Status: AC

## 2022-11-26 MED ORDER — ULTRA FLO INSULIN PEN NEEDLES 32G X 4 MM MISC: 1 refills | Status: AC

## 2022-11-26 MED ORDER — INSULIN GLARGINE 100 UNIT/ML SOLOSTAR PEN: 25.0000 [IU] | PEN_INJECTOR | Freq: Every day | SUBCUTANEOUS | 1 refills | Status: AC

## 2022-11-26 MED ORDER — PROCHLORPERAZINE EDISYLATE 10 MG/2ML IJ SOLN
5.0000 mg | Freq: Once | INTRAMUSCULAR | Status: AC
  Administered 2022-11-26: 5 mg via INTRAVENOUS
  Filled 2022-11-26: qty 2

## 2022-11-26 MED ORDER — NOVOLOG FLEXPEN RELION 100 UNIT/ML ~~LOC~~ SOPN: PEN_INJECTOR | SUBCUTANEOUS | 11 refills | Status: AC

## 2022-11-26 MED ORDER — LISINOPRIL 5 MG PO TABS: 5.0000 mg | ORAL_TABLET | Freq: Every day | ORAL | 1 refills | Status: AC

## 2022-11-26 MED ORDER — FREESTYLE LIBRE 3 READER DEVI: 0 refills | Status: AC

## 2022-11-26 MED ORDER — LISINOPRIL 5 MG PO TABS: 5.0000 mg | ORAL_TABLET | Freq: Every day | ORAL | Status: DC

## 2022-11-26 MED ORDER — BLOOD GLUCOSE TEST VI STRP: 1.0000 | ORAL_STRIP | Freq: Three times a day (TID) | 0 refills | Status: AC

## 2022-11-26 MED ORDER — ATORVASTATIN CALCIUM 10 MG PO TABS: 10.0000 mg | ORAL_TABLET | Freq: Every day | ORAL | 1 refills | Status: AC

## 2022-11-26 MED ORDER — DIPHENHYDRAMINE HCL 50 MG/ML IJ SOLN
12.5000 mg | Freq: Once | INTRAMUSCULAR | Status: AC
  Administered 2022-11-26: 12.5 mg via INTRAVENOUS
  Filled 2022-11-26: qty 1

## 2022-11-26 MED ORDER — BLOOD GLUCOSE MONITORING SUPPL DEVI: 1.0000 | Freq: Three times a day (TID) | 0 refills | Status: AC

## 2022-11-26 MED ORDER — FENOFIBRATE 160 MG PO TABS: 160.0000 mg | ORAL_TABLET | Freq: Every day | ORAL | 1 refills | Status: AC

## 2022-11-26 MED ORDER — LANCET DEVICE MISC: 1.0000 | Freq: Three times a day (TID) | 0 refills | Status: AC

## 2022-11-26 MED ORDER — ATORVASTATIN CALCIUM 10 MG PO TABS: 10.0000 mg | ORAL_TABLET | Freq: Every day | ORAL | Status: DC

## 2022-11-26 NOTE — Progress Notes (Signed)
Patient complained of a headache this morning, felt a little clammy. Checked vitals and blood glucose, stable. Provided tylenol for headache. Patient stated his throat had been hurting. Provided ordered magic mouth wash & chloraseptic spray. Patient with call bell next to him; stated he would try and eat shortly.

## 2022-11-26 NOTE — Plan of Care (Signed)

## 2022-11-26 NOTE — Discharge Summary (Signed)
Physician Discharge Summary   Patient: Mark Acosta MRN: [id number] DOB: Mar 18, 1971  Admit date:     11/24/2022  Discharge date: 11/26/22  Discharge Physician: Onalee Hua Nikiah Goin   PCP: Pcp, No   Recommendations at discharge:   Please follow up with primary care provider within 1-2 weeks  Please repeat BMP and CBC in one week    Hospital Course: 52 year old male with a history of diabetes mellitus type 2, hypertension, hyperlipidemia presenting secondary to elevated CBGs.  The patient states that he has been having polydipsia and polyuria for about 2 weeks.  He states that he has been losing some weight, but has difficulty quantifying how much.  He states that he checked his CBGs on a glucometer at home and noted to be 531.  As result he came to emergency department for further evaluation and treatment.  He denies any fevers, chills, chest pain, short of breath, nausea, vomiting or diarrhea, hematochezia, melena.  He states that he previously took metformin about 2 to 3 years ago,.  He was stopped because he was told that his sugars were better. Notably, the patient states that he recently relapsed on methamphetamine and cocaine.  He last used about 2 days prior to admission.  He denies any alcohol use.   In the ED, the patient was afebrile and hemodynamically stable with oxygen saturation of 100% room air.  WBC 9.5, hemoglobin 13.9, platelets 268,000.  Sodium 131, potassium 3.8, bicarbonate 26, serum creatinine 0.57.  LFTs were unremarkable.  CT of the abdomen and pelvis was negative for any acute findings.  Chest x-ray was negative.  Assessment and Plan: Hyperglycemic hyperosmolar nonketotic state -Improved with IV fluids -Start Lantus 15 units daily>>d/c with 25 units daily -NovoLog sliding scale>>see below -I have spent lots of time explaining SSI and insulin to pt and spouse -Continued IV fluids during hospitalization   Hyponatremia -Secondary to hyperglycemia -Improving with IV fluids    Essential hypertension -Currently not on any antihypertensive medications -Monitor blood pressures while inpatient -d/c home with lisinopril   Mixed hyperlipidemia -Check lipid panel -Total cholesterol 155, triglycerides 465 -d/c home with atorva 10 mg daily and fenofibrate   Polysubstance abuse -Including methamphetamine and cocaine -Cessation discussed -Urine drug screen--positive amphetamine   Oropharyngeal candidiasis -Start Magic mouthwash   Class 2 obesity -BMI 36.24 -lifestyle modification      Consultants: none Procedures performed: none  Disposition: Home Diet recommendation:  Carb modified diet DISCHARGE MEDICATION: Allergies as of 11/26/2022   No Known Allergies      Medication List     TAKE these medications    atorvastatin 10 MG tablet Commonly known as: LIPITOR Take 1 tablet (10 mg total) by mouth daily.   Blood Glucose Monitoring Suppl Devi 1 each by Does not apply route in the morning, at noon, and at bedtime. May substitute to any manufacturer covered by patient's insurance.   BLOOD GLUCOSE TEST STRIPS Strp 1 each by In Vitro route in the morning, at noon, and at bedtime. May substitute to any manufacturer covered by patient's insurance.   fenofibrate 160 MG tablet Take 1 tablet (160 mg total) by mouth daily.   FreeStyle Libre 3 Reader Sully Use to check sugar before each meal and at bedtime   FreeStyle Libre 3 Sensor Misc Use to monitor sugars before each meal and at bedtime   insulin glargine 100 UNIT/ML Solostar Pen Commonly known as: LANTUS Inject 25 Units into the skin daily.   Lancet Device Misc 1 each  by Does not apply route in the morning, at noon, and at bedtime. May substitute to any manufacturer covered by patient's insurance.   Lancets Misc. Misc 1 each by Does not apply route in the morning, at noon, and at bedtime. May substitute to any manufacturer covered by patient's insurance.   lisinopril 5 MG tablet Commonly  known as: ZESTRIL Take 1 tablet (5 mg total) by mouth daily. Start taking on: November 27, 2022   magic mouthwash Soln Take 10 mLs by mouth 4 (four) times daily.   NovoLOG FlexPen 100 UNIT/ML FlexPen Generic drug: insulin aspart Sugar 70-120>>no insulin;  121-150>>2 units, 151-200>>3 units, 201-250>>5 units, 251-300>>8 units, 301-350>>11 units, 351-400>>15 units   oxyCODONE-acetaminophen 10-325 MG tablet Commonly known as: PERCOCET Take 1 tablet by mouth every 4 (four) hours as needed for pain.   Ultra Flo Insulin Pen Needles 32G X 4 MM Misc Generic drug: Insulin Pen Needle Use with insulin pen to dispense insulin as directed        Discharge Exam: Filed Weights   11/24/22 1939 11/25/22 0105  Weight: 97.5 kg 101.8 kg   HEENT:  [place]/AT, No thrush, no icterus CV:  RRR, no rub, no S3, no S4 Lung:  CTA, no wheeze, no rhonchi Abd:  soft/+BS, NT Ext:  No edema, no lymphangitis, no synovitis, no rash   Condition at discharge: fair  The results of significant diagnostics from this hospitalization (including imaging, microbiology, ancillary and laboratory) are listed below for reference.   Imaging Studies: CT ABDOMEN PELVIS W CONTRAST  Result Date: 11/24/2022 CLINICAL DATA:  Left lower quadrant abdominal pain. EXAM: CT ABDOMEN AND PELVIS WITH CONTRAST TECHNIQUE: Multidetector CT imaging of the abdomen and pelvis was performed using the standard protocol following bolus administration of intravenous contrast. RADIATION DOSE REDUCTION: This exam was performed according to the departmental dose-optimization program which includes automated exposure control, adjustment of the mA and/or kV according to patient size and/or use of iterative reconstruction technique. CONTRAST:  OMNIPAQUE IOHEXOL 300 MG/ML  SOLN COMPARISON:  CT abdomen and pelvis 09/02/2014 FINDINGS: Lower chest: No acute abnormality. Hepatobiliary: Unremarkable liver. Normal gallbladder. No biliary dilation. Pancreas:  Unremarkable. Spleen: Unremarkable. Adrenals/Urinary Tract: Normal adrenal glands. No urinary calculi or hydronephrosis. Bladder is unremarkable. Stomach/Bowel: Normal caliber large and small bowel. No bowel wall thickening. The appendix is normal.Stomach is within normal limits. Vascular/Lymphatic: No significant vascular findings are present. No enlarged abdominal or pelvic lymph nodes. Reproductive: Unremarkable. Other: No free intraperitoneal fluid or air. Musculoskeletal: No acute fracture. IMPRESSION: No acute abnormality in the abdomen or pelvis. Electronically Signed   By: Minerva Fester M.D.   On: 11/24/2022 23:36   DG Chest Portable 1 View  Result Date: 11/24/2022 CLINICAL DATA:  Hyperglycemia and cough EXAM: PORTABLE CHEST 1 VIEW COMPARISON:  Radiograph report from 03/01/2014 FINDINGS: The heart size and mediastinal contours are within normal limits. Low lung volumes accentuate pulmonary vascularity. Both lungs are clear. The visualized skeletal structures are unremarkable. IMPRESSION: No active disease. Electronically Signed   By: Minerva Fester M.D.   On: 11/24/2022 21:34    Microbiology: Results for orders placed or performed during the hospital encounter of 11/24/22  Culture, blood (routine x 2)     Status: None (Preliminary result)   Collection Time: 11/24/22  8:40 PM   Specimen: BLOOD  Result Value Ref Range Status   Specimen Description BLOOD RIGHT ANTECUBITAL  Final   Special Requests   Final    BOTTLES DRAWN AEROBIC AND ANAEROBIC  Blood Culture adequate volume   Culture   Final    NO GROWTH 2 DAYS Performed at [facility], [address] [place], [place] [address]    Report Status PENDING  Incomplete  Culture, blood (routine x 2)     Status: None (Preliminary result)   Collection Time: 11/24/22  8:40 PM   Specimen: BLOOD  Result Value Ref Range Status   Specimen Description BLOOD LEFT ANTECUBITAL  Final   Special Requests   Final    BOTTLES DRAWN AEROBIC AND ANAEROBIC  Blood Culture adequate volume   Culture   Final    NO GROWTH 2 DAYS Performed at [facility], [address], [place], [place] [address]    Report Status PENDING  Incomplete    Labs: CBC: Recent Labs  Lab 11/24/22 2040 11/25/22 0407  WBC 9.5 8.4  HGB 15.9 14.6  HCT 45.0 41.0  MCV 82.6 82.3  PLT 282 268   Basic Metabolic Panel: Recent Labs  Lab 11/24/22 2040 11/25/22 0407 11/26/22 0510  NA 127* 131* 132*  K 3.7 3.8 3.8  CL 89* 96* 98  CO2 27 26 26   GLUCOSE 499* 320* 279*  BUN 14 14 15   CREATININE 0.62 0.57* 0.52*  CALCIUM 9.1 8.4* 8.2*  MG  --  2.0 1.7  PHOS  --  4.1  --    Liver Function Tests: Recent Labs  Lab 11/24/22 2040 11/25/22 0407  AST 22 17  ALT 30 26  ALKPHOS 113 101  BILITOT 0.5 0.4  PROT 7.7 6.7  ALBUMIN 4.1 3.5   CBG: Recent Labs  Lab 11/25/22 1113 11/25/22 1706 11/25/22 2043 11/26/22 0735 11/26/22 1120  GLUCAP 353* 244* 331* 228* 273*    Discharge time spent: greater than 30 minutes.  Signed: Catarina Hartshorn, MD [facility] 11/26/2022

## 2022-11-26 NOTE — Progress Notes (Signed)
Patient discharge discussed with his wife as patient is quite drowsy from recent benadryl administration. Once patient is more awake, family will take him home. Call bell within reach of family at bedside.

## 2022-11-29 LAB — CULTURE, BLOOD (ROUTINE X 2)
Culture: NO GROWTH
Culture: NO GROWTH
Special Requests: ADEQUATE
Special Requests: ADEQUATE

## 2022-12-16 DIAGNOSIS — F1120 Opioid dependence, uncomplicated: Secondary | ICD-10-CM | POA: Diagnosis not present

## 2022-12-17 DIAGNOSIS — F1120 Opioid dependence, uncomplicated: Secondary | ICD-10-CM | POA: Diagnosis not present

## 2022-12-17 DIAGNOSIS — F1211 Cannabis abuse, in remission: Secondary | ICD-10-CM | POA: Diagnosis not present

## 2022-12-19 DIAGNOSIS — F1511 Other stimulant abuse, in remission: Secondary | ICD-10-CM | POA: Diagnosis not present

## 2022-12-22 DIAGNOSIS — F1511 Other stimulant abuse, in remission: Secondary | ICD-10-CM | POA: Diagnosis not present

## 2022-12-22 DIAGNOSIS — F1211 Cannabis abuse, in remission: Secondary | ICD-10-CM | POA: Diagnosis not present

## 2022-12-22 DIAGNOSIS — F1120 Opioid dependence, uncomplicated: Secondary | ICD-10-CM | POA: Diagnosis not present

## 2023-01-03 DIAGNOSIS — F1520 Other stimulant dependence, uncomplicated: Secondary | ICD-10-CM | POA: Diagnosis not present

## 2023-01-06 DIAGNOSIS — F1520 Other stimulant dependence, uncomplicated: Secondary | ICD-10-CM | POA: Diagnosis not present

## 2023-01-19 DIAGNOSIS — I2510 Atherosclerotic heart disease of native coronary artery without angina pectoris: Secondary | ICD-10-CM | POA: Diagnosis not present

## 2023-01-19 DIAGNOSIS — R140 Abdominal distension (gaseous): Secondary | ICD-10-CM | POA: Diagnosis not present

## 2023-01-19 DIAGNOSIS — R162 Hepatomegaly with splenomegaly, not elsewhere classified: Secondary | ICD-10-CM | POA: Diagnosis not present

## 2023-01-19 DIAGNOSIS — R109 Unspecified abdominal pain: Secondary | ICD-10-CM | POA: Diagnosis not present

## 2023-01-19 DIAGNOSIS — K429 Umbilical hernia without obstruction or gangrene: Secondary | ICD-10-CM | POA: Diagnosis not present

## 2023-01-19 DIAGNOSIS — R079 Chest pain, unspecified: Secondary | ICD-10-CM | POA: Diagnosis not present

## 2024-04-04 DEATH — deceased
